# Patient Record
Sex: Female | Born: 1940
Health system: Southern US, Community
[De-identification: ages and names within clinical notes are randomized; demographics above are authoritative.]

## PROBLEM LIST (undated history)

## (undated) DIAGNOSIS — E042 Nontoxic multinodular goiter: Secondary | ICD-10-CM

## (undated) DIAGNOSIS — L259 Unspecified contact dermatitis, unspecified cause: Secondary | ICD-10-CM

## (undated) DIAGNOSIS — I10 Essential (primary) hypertension: Secondary | ICD-10-CM

## (undated) DIAGNOSIS — E785 Hyperlipidemia, unspecified: Secondary | ICD-10-CM

## (undated) DIAGNOSIS — M810 Age-related osteoporosis without current pathological fracture: Secondary | ICD-10-CM

## (undated) DIAGNOSIS — J309 Allergic rhinitis, unspecified: Secondary | ICD-10-CM

## (undated) HISTORY — DX: Age-related osteoporosis without current pathological fracture: M81.0

## (undated) HISTORY — DX: Hyperlipidemia, unspecified: E78.5

## (undated) HISTORY — DX: Allergic rhinitis, unspecified: J30.9

## (undated) HISTORY — DX: Hypercalcemia: E83.52

## (undated) HISTORY — DX: Nontoxic multinodular goiter: E04.2

## (undated) HISTORY — DX: Unspecified contact dermatitis, unspecified cause: L25.9

---

## 1999-02-04 ENCOUNTER — Other Ambulatory Visit: Admission: RE | Admit: 1999-02-04 | Discharge: 1999-02-04 | Payer: Self-pay | Admitting: Obstetrics and Gynecology

## 2000-03-09 ENCOUNTER — Other Ambulatory Visit: Admission: RE | Admit: 2000-03-09 | Discharge: 2000-03-09 | Payer: Self-pay | Admitting: Obstetrics and Gynecology

## 2001-03-15 ENCOUNTER — Other Ambulatory Visit: Admission: RE | Admit: 2001-03-15 | Discharge: 2001-03-15 | Payer: Self-pay | Admitting: Obstetrics and Gynecology

## 2002-03-21 ENCOUNTER — Other Ambulatory Visit: Admission: RE | Admit: 2002-03-21 | Discharge: 2002-03-21 | Payer: Self-pay | Admitting: Obstetrics and Gynecology

## 2003-03-24 ENCOUNTER — Other Ambulatory Visit: Admission: RE | Admit: 2003-03-24 | Discharge: 2003-03-24 | Payer: Self-pay | Admitting: Obstetrics and Gynecology

## 2004-03-16 ENCOUNTER — Other Ambulatory Visit: Admission: RE | Admit: 2004-03-16 | Discharge: 2004-03-16 | Payer: Self-pay | Admitting: *Deleted

## 2006-03-23 ENCOUNTER — Other Ambulatory Visit: Admission: RE | Admit: 2006-03-23 | Discharge: 2006-03-23 | Payer: Self-pay | Admitting: Obstetrics and Gynecology

## 2007-02-26 ENCOUNTER — Encounter: Payer: Self-pay | Admitting: Internal Medicine

## 2007-03-08 ENCOUNTER — Other Ambulatory Visit: Admission: RE | Admit: 2007-03-08 | Discharge: 2007-03-08 | Payer: Self-pay | Admitting: Obstetrics and Gynecology

## 2008-01-31 ENCOUNTER — Other Ambulatory Visit: Admission: RE | Admit: 2008-01-31 | Discharge: 2008-01-31 | Payer: Self-pay | Admitting: Obstetrics and Gynecology

## 2008-09-16 ENCOUNTER — Encounter: Payer: Self-pay | Admitting: Internal Medicine

## 2009-02-03 ENCOUNTER — Encounter: Payer: Self-pay | Admitting: Internal Medicine

## 2009-02-03 ENCOUNTER — Other Ambulatory Visit: Admission: RE | Admit: 2009-02-03 | Discharge: 2009-02-03 | Payer: Self-pay | Admitting: Obstetrics and Gynecology

## 2009-02-03 LAB — CONVERTED CEMR LAB
Alkaline Phosphatase: 59 units/L
BUN: 14 mg/dL
Chloride: 102 meq/L
Glucose, Bld: 98 mg/dL
Potassium: 4.9 meq/L

## 2009-02-27 ENCOUNTER — Encounter: Payer: Self-pay | Admitting: Internal Medicine

## 2009-06-03 ENCOUNTER — Encounter: Payer: Self-pay | Admitting: Internal Medicine

## 2009-06-03 LAB — CONVERTED CEMR LAB
BUN: 14 mg/dL
Chloride: 99 meq/L
Creatinine, Ser: 0.88 mg/dL
Sodium: 142 meq/L
TSH: 2.15 microintl units/mL

## 2009-07-06 ENCOUNTER — Encounter: Admission: RE | Admit: 2009-07-06 | Discharge: 2009-07-06 | Payer: Self-pay | Admitting: Internal Medicine

## 2009-07-28 ENCOUNTER — Ambulatory Visit: Payer: Self-pay | Admitting: Pulmonary Disease

## 2009-07-28 DIAGNOSIS — E785 Hyperlipidemia, unspecified: Secondary | ICD-10-CM | POA: Insufficient documentation

## 2009-07-31 ENCOUNTER — Encounter: Payer: Self-pay | Admitting: Internal Medicine

## 2009-07-31 LAB — CONVERTED CEMR LAB
BUN: 15 mg/dL
Creatinine, Ser: 1 mg/dL

## 2009-08-03 ENCOUNTER — Encounter: Admission: RE | Admit: 2009-08-03 | Discharge: 2009-08-03 | Payer: Self-pay | Admitting: Internal Medicine

## 2009-08-26 ENCOUNTER — Encounter: Payer: Self-pay | Admitting: Internal Medicine

## 2009-08-26 ENCOUNTER — Encounter: Admission: RE | Admit: 2009-08-26 | Discharge: 2009-08-26 | Payer: Self-pay | Admitting: Internal Medicine

## 2009-09-23 ENCOUNTER — Encounter: Payer: Self-pay | Admitting: Internal Medicine

## 2009-09-23 ENCOUNTER — Encounter: Admission: RE | Admit: 2009-09-23 | Discharge: 2009-09-23 | Payer: Self-pay | Admitting: Internal Medicine

## 2009-09-23 ENCOUNTER — Other Ambulatory Visit: Admission: RE | Admit: 2009-09-23 | Discharge: 2009-09-23 | Payer: Self-pay | Admitting: Interventional Radiology

## 2009-10-19 ENCOUNTER — Encounter: Payer: Self-pay | Admitting: Internal Medicine

## 2009-11-10 ENCOUNTER — Encounter (HOSPITAL_COMMUNITY): Admission: RE | Admit: 2009-11-10 | Discharge: 2009-12-29 | Payer: Self-pay | Admitting: Internal Medicine

## 2009-11-11 ENCOUNTER — Encounter: Payer: Self-pay | Admitting: Internal Medicine

## 2009-11-16 ENCOUNTER — Ambulatory Visit: Payer: Self-pay | Admitting: Internal Medicine

## 2009-11-16 DIAGNOSIS — M810 Age-related osteoporosis without current pathological fracture: Secondary | ICD-10-CM | POA: Insufficient documentation

## 2009-11-16 DIAGNOSIS — M79609 Pain in unspecified limb: Secondary | ICD-10-CM | POA: Insufficient documentation

## 2009-11-16 DIAGNOSIS — J309 Allergic rhinitis, unspecified: Secondary | ICD-10-CM | POA: Insufficient documentation

## 2009-11-24 ENCOUNTER — Encounter: Payer: Self-pay | Admitting: Internal Medicine

## 2009-11-25 ENCOUNTER — Encounter: Payer: Self-pay | Admitting: Internal Medicine

## 2009-12-01 ENCOUNTER — Ambulatory Visit: Payer: Self-pay | Admitting: Internal Medicine

## 2009-12-01 DIAGNOSIS — R5381 Other malaise: Secondary | ICD-10-CM | POA: Insufficient documentation

## 2009-12-01 DIAGNOSIS — R5383 Other fatigue: Secondary | ICD-10-CM

## 2009-12-01 DIAGNOSIS — L259 Unspecified contact dermatitis, unspecified cause: Secondary | ICD-10-CM | POA: Insufficient documentation

## 2009-12-01 LAB — CONVERTED CEMR LAB
Albumin: 4.4 g/dL (ref 3.5–5.2)
Alkaline Phosphatase: 63 units/L (ref 39–117)
Bilirubin, Direct: 0.1 mg/dL (ref 0.0–0.3)
Calcium: 9.4 mg/dL (ref 8.4–10.5)
Creatinine, Ser: 0.8 mg/dL (ref 0.4–1.2)
Eosinophils Relative: 1.6 % (ref 0.0–5.0)
GFR calc non Af Amer: 80.57 mL/min (ref >60)
Glucose, Bld: 86 mg/dL (ref 70–99)
HCT: 41.5 % (ref 36.0–46.0)
Hemoglobin: 14.4 g/dL (ref 12.0–15.0)
MCHC: 34.5 g/dL (ref 30.0–36.0)
MCV: 93 fL (ref 78.0–100.0)
Monocytes Relative: 9.3 % (ref 3.0–12.0)
Neutro Abs: 4.1 10*3/uL (ref 1.4–7.7)
Potassium: 4.9 meq/L (ref 3.5–5.1)
Triglycerides: 109 mg/dL (ref 0.0–149.0)
WBC: 6.1 10*3/uL (ref 4.5–10.5)

## 2009-12-02 ENCOUNTER — Telehealth: Payer: Self-pay | Admitting: Internal Medicine

## 2009-12-02 ENCOUNTER — Telehealth (INDEPENDENT_AMBULATORY_CARE_PROVIDER_SITE_OTHER): Payer: Self-pay | Admitting: *Deleted

## 2009-12-16 ENCOUNTER — Ambulatory Visit: Payer: Self-pay | Admitting: Endocrinology

## 2009-12-16 DIAGNOSIS — E042 Nontoxic multinodular goiter: Secondary | ICD-10-CM | POA: Insufficient documentation

## 2009-12-16 LAB — CONVERTED CEMR LAB
Calcium, Total (PTH): 10.6 mg/dL — ABNORMAL HIGH (ref 8.4–10.5)
PTH: 14.1 pg/mL (ref 14.0–72.0)

## 2009-12-23 ENCOUNTER — Telehealth: Payer: Self-pay | Admitting: Internal Medicine

## 2009-12-29 ENCOUNTER — Ambulatory Visit: Payer: Self-pay | Admitting: Internal Medicine

## 2009-12-29 DIAGNOSIS — M545 Low back pain, unspecified: Secondary | ICD-10-CM | POA: Insufficient documentation

## 2010-02-03 ENCOUNTER — Encounter: Admission: RE | Admit: 2010-02-03 | Discharge: 2010-02-03 | Payer: Self-pay | Admitting: Endocrinology

## 2010-02-19 ENCOUNTER — Ambulatory Visit: Payer: Self-pay | Admitting: Internal Medicine

## 2010-03-04 ENCOUNTER — Encounter: Payer: Self-pay | Admitting: Internal Medicine

## 2010-05-28 ENCOUNTER — Ambulatory Visit: Payer: Self-pay | Admitting: Internal Medicine

## 2010-06-02 ENCOUNTER — Ambulatory Visit: Admit: 2010-06-02 | Payer: Self-pay | Admitting: Internal Medicine

## 2010-06-02 LAB — CONVERTED CEMR LAB
Cholesterol: 221 mg/dL — ABNORMAL HIGH (ref 0–200)
TSH: 1.58 microintl units/mL (ref 0.35–5.50)
Triglycerides: 73 mg/dL (ref 0.0–149.0)

## 2010-06-09 ENCOUNTER — Encounter
Admission: RE | Admit: 2010-06-09 | Discharge: 2010-06-09 | Payer: Self-pay | Source: Home / Self Care | Attending: Internal Medicine | Admitting: Internal Medicine

## 2010-06-20 ENCOUNTER — Encounter: Payer: Self-pay | Admitting: Internal Medicine

## 2010-06-29 NOTE — Assessment & Plan Note (Signed)
Summary: ?shingles/cd   Vital Signs:  Patient profile:   70 year old female Height:      61.75 inches (156.85 cm) Weight:      122 pounds (55.45 kg) O2 Sat:      98 % on Room air Temp:     98.5 degrees F (36.94 degrees C) oral Pulse rate:   67 / minute BP sitting:   110 / 72  (left arm) Cuff size:   regular  Vitals Entered By: Orlan Leavens (December 01, 2009 8:58 AM)  O2 Flow:  Room air CC: ? Shingles, Rash Is Patient Diabetic? No Pain Assessment Patient in pain? no      Comments Pt states she has develop a red spot/rash on (R) side. Not painful but does itch   Primary Care Provider:  Newt Lukes MD  CC:  ? Shingles and Rash.  History of Present Illness:  Rash      This is a 70 year old woman who presents with Rash.  The symptoms began duration 6-8 weeks ago.  The severity is described as mild.  intermittent small area of itch in linear pattern on right flank -  worried about shingles?.  The patient reports itching and redness, but denies hives, welts, pustules, ulcers, oozing, and increased warmth.  The rash is located on the  lateral chest.  The rash is worse with heat, worse with scratching, better with calamine lotion, and better with H2 blockers.  The patient denies the following symptoms: fever, facial swelling, burning, difficulty breathing, abdominal pain, nausea, dizziness, and dysuria.  The patient denies history of recent tick bite, recent infection, recent antibiotic use, new medication, new clothing, new topical exposure, and pet/animal contact.    also c/o fatigue  reviewed prior OV also:  1) thyroid nodule - on left side - following with endo - s/p I -131 tx 10/2009 - denies mood or weight or bowel changes - confused re: results and mgmt recs from endo on "next step" tx plans, ?removal of gland - would like second opinion re: mgmt  2) dyslipidemia - reports last lipids showed total >200, never high before and never on tx for same - since last check, has  changed to low fat diet and tried to inc exercise (ballroom dancing) - fasting today  3) osteoporosis hx - by report - on actonel >36yrs for same - no hx fx known but c/o right foot pain since accidental injury 12/2008 - initially foot swollen and bruised after kick injury against wall while dancing, no pain and throbbing same location on foot nearly constant - stopped bisphos tx <12 mo ago indep after reading reports on risk of tx > 102yr  Clinical Review Panels:  Complete Metabolic Panel   Glucose:  86 (06/03/2009)   Sodium:  142 (06/03/2009)   Potassium:  4.3 (06/03/2009)   Chloride:  99 (06/03/2009)   CO2:  26 (06/03/2009)   BUN:  15 (07/31/2009)   Creatinine:  0.80 (10/19/2009)   Albumin:  4.4 (02/03/2009)   Total Protein:  6.7 (02/03/2009)   Calcium:  9.5 (10/19/2009)   Total Bili:  0.6 (02/03/2009)   Alk Phos:  59 (02/03/2009)   SGPT (ALT):  17 (02/03/2009)   SGOT (AST):  23 (02/03/2009)   Current Medications (verified): 1)  Multivitamins   Tabs (Multiple Vitamin) .... Take 1 Tablet By Mouth Once A Day 2)  Caltrate 600+d Plus 600-400 Mg-Unit Tabs (Calcium Carbonate-Vit D-Min) .Marland Kitchen.. 1 By Mouth Two Times  A Day  Allergies (verified): 1)  Codeine  Past History:  Past Medical History: Hyperlipidemia Allergic rhinitis Thyroid problems osteoporosis   MD roster: gyn - [facility] - kerr  Review of Systems  The patient denies hoarseness, chest pain, syncope, and headaches.    Physical Exam  General:  alert, well-developed, well-nourished, and cooperative to examination.    Lungs:  normal respiratory effort, no intercostal retractions or use of accessory muscles; normal breath sounds bilaterally - no crackles and no wheezes.    Heart:  normal rate, regular rhythm, no murmur, and no rub. BLE without edema.  Skin:  eczema linear patch on right flank Psych:  Oriented X3, memory intact for recent and remote, normally interactive, good eye contact, not anxious appearing, not  depressed appearing, and not agitated.      Impression & Recommendations:  Problem # 1:  ECZEMA (ICD-692.9)  reassured - rash not shingles - but do rec zostavax - ok to use benadryl spray as needed (declines steroid cream rx)  Discussed avoidance of triggers and symptomatic treatment.   Problem # 2:  THYROID NODULE, LEFT (ICD-241.0)  by hx, s/p I-131 10/2009 - ?rec for thyroidectomy per pt report of endo recs will refer for second endo opinion (pt requests LeB)- to cont mgmt as ongoing  Orders: TLB-TSH (Thyroid Stimulating Hormone) (84443-TSH) Endocrinology Referral (Endocrine)  Problem # 3:  HYPERLIPIDEMIA (ICD-272.4)  hx same, never on med tx - check labs now advised cont exercise and heart healthy diet/low fat  Orders: TLB-Lipid Panel (80061-LIPID)  Labs Reviewed: SGOT: 23 (02/03/2009)   SGPT: 17 (02/03/2009)  Problem # 4:  FATIGUE (ICD-780.79)  Orders: TLB-CBC Platelet - w/Differential (85025-CBCD) TLB-TSH (Thyroid Stimulating Hormone) (84443-TSH) TLB-Hepatic/Liver Function Pnl (80076-HEPATIC) TLB-BMP (Basic Metabolic Panel-BMET) (80048-METABOL)  Complete Medication List: 1)  Multivitamins Tabs (Multiple vitamin) .... Take 1 tablet by mouth once a day 2)  Caltrate 600+d Plus 600-400 Mg-unit Tabs (Calcium carbonate-vit d-min) .Marland Kitchen.. 1 by mouth two times a day  Patient Instructions: 1)  it was good to see you today. 2)  test(s) ordered today - your results will be posted on the phone tree for review in 48-72 hours from the time of test completion; call [phone] and enter your 9 digit MRN (listed above on this page, just below your name); if any changes need to be made or there are abnormal results, you will be contacted directly.  3)  consider Shingles vaccine once details worked out -- 4)  we'll make referral to [facility] endocrinology for second opinion about your thyroid nodule. Our office will contact you regarding this appointment once made.  5)  Please  schedule a follow-up appointment in 6 months, sooner if problems.

## 2010-06-29 NOTE — Progress Notes (Signed)
Summary: 24 hour urine  Phone Note From Other Clinic   Caller: shana ext [phone] Summary of Call: Pt down stairs stating Dr. Everardo All left msg on phone tree for her to come in for 24 hour urine. No order in comp. Address with Dr. Jonny Ruiz he states ok to put order in IDX. dx: 275.42 Initial call taken by: Orlan Leavens RMA,  December 23, 2009 3:30 PM  Follow-up for Phone Call        Notified shana ok for order. Put in IDX Follow-up by: Orlan Leavens RMA,  December 23, 2009 3:33 PM

## 2010-06-29 NOTE — Letter (Signed)
Summary: [facility]  [facility]   Imported By: Lester  11/27/2009 09:18:11  _____________________________________________________________________  External Attachment:    Type:   Image     Comment:   External Document

## 2010-06-29 NOTE — Assessment & Plan Note (Signed)
Summary: MUSCLE SPASMS/NWS   Vital Signs:  Patient profile:   70 year old female Height:      61.75 inches (156.85 cm) Weight:      120.50 pounds (54.77 kg) O2 Sat:      96 % on Room air Temp:     98.1 degrees F (36.72 degrees C) oral Pulse rate:   68 / minute BP sitting:   118 / 72  (left arm) Cuff size:   regular  Vitals Entered By: Orlan Leavens RMA (December 29, 2009 1:42 PM)  O2 Flow:  Room air CC: muscle spasm (R) back, Back pain Is Patient Diabetic? No Pain Assessment Patient in pain? yes     Location: lower back Type: aching   Primary Care Provider:  Newt Lukes MD  CC:  muscle spasm (R) back and Back pain.  History of Present Illness:  Back Pain      This is a 70 year old woman who presents with Back pain.  The symptoms began 5 days ago.  The intensity is described as moderate-severe.  picking up mother's transport chair and twisting -.  The patient reports rest pain, but denies fever, chills, weakness, loss of sensation, fecal incontinence, dysuria, inability to work, and inability to care for self.  The pain is located in the right low back and right SI joint.  The pain began after lifting.  The pain radiates to the right buttock.  The pain is made worse by standing or walking and activity.  The pain is made better by inactivity and NSAID medications.  Risk factors for serious underlying conditions include age >= 50 years and history of osteoporosis.     prior hx also reviewed:  pt was incidentally noted to have a nodular thyroid, on ct of chest.  she was unaware of having any thyroid problem. symptomatically, she reports few years of slight dryness of the hair, but no associated swelling of the neck.    pt was noted to have osteoporosis in 2008, and again in 2010.  she took actonel x 7 years.  she denies:  prolonged bedrest, urolithiasis, seizures, dvt/pe, renal dz, and liver dz.  only bony fracture was a car door slamming on 2 fingers as a child.   Current  Medications (verified): 1)  Multivitamins   Tabs (Multiple Vitamin) .... Take 1 Tablet By Mouth Once A Day 2)  Caltrate 600+d Plus 600-400 Mg-Unit Tabs (Calcium Carbonate-Vit D-Min) .Marland Kitchen.. 1 By Mouth Two Times A Day  Allergies (verified): 1)  Codeine  Past History:  Past Medical History: Hyperlipidemia Allergic rhinitis Thyroid problems osteoporosis    MD roster: gyn - [facility] - kerr  Review of Systems  The patient denies fever, incontinence, muscle weakness, and difficulty walking.    Physical Exam  General:  alert, well-developed, well-nourished, and cooperative to examination.    Lungs:  normal respiratory effort, no intercostal retractions or use of accessory muscles; normal breath sounds bilaterally - no crackles and no wheezes.    Heart:  normal rate, regular rhythm, no murmur, and no rub. BLE without edema.  Msk:  back: full range of motion of lumbar spine. Nontender to palpatio over spine but +paraspinal spasm on right side. Negative straight leg raise. Deep tendon reflexes symmetrically intact at Achilles and patella, negative clonus. Sensation intact throughout all dermatomes in bilateral lower extremities. Full strength to manual muscle testing in all major muscule groups including EHL, anterior tibialis, gastrocnemius, quadriceps, and iliopsoas. Able to  heel and toe walk without difficulty and ambulates with a normal gait.    Impression & Recommendations:  Problem # 1:  BACK PAIN, LUMBAR (ICD-724.2)  muscle spasm after lifting - no radiculopathy  Orders: Prescription Created Electronically [phone])  Her updated medication list for this problem includes:    Robaxin 500 Mg Tabs (Methocarbamol) .Marland Kitchen... 1 by mouth every 8 hours as needed for back spasm  Complete Medication List: 1)  Multivitamins Tabs (Multiple vitamin) .... Take 1 tablet by mouth once a day 2)  Caltrate 600+d Plus 600-400 Mg-unit Tabs (Calcium carbonate-vit d-min) .Marland Kitchen.. 1 by mouth two times a  day 3)  Robaxin 500 Mg Tabs (Methocarbamol) .Marland Kitchen.. 1 by mouth every 8 hours as needed for back spasm  Patient Instructions: 1)  it was good to see you today. 2)  robaxin for muscle spasm - use with advil and gel packs as you are doing 3)  Most patients (90%) with low back pain will improve with time (2-6 weeks). Keep active but avoid activities that are painful. Apply moist heat and/or ice to lower back several times a day. 4)  Please schedule a follow-up appointment as needed. Prescriptions: ROBAXIN 500 MG TABS (METHOCARBAMOL) 1 by mouth every 8 hours as needed for back spasm  #30 x 0   Entered and Authorized by:   Newt Lukes MD   Signed by:   Newt Lukes MD on 12/29/2009   Method used:   Electronically to        [facility] [phone]* (retail)       [address]       [place], [place]  [address]       Ph: [phone] or [phone]       Fax: [phone]   RxID:   [id number]

## 2010-06-29 NOTE — Assessment & Plan Note (Signed)
Summary: PER FLAG/MP-SCHED NEW ENDO PT-MEDICARE-LEFT THY NODULE-PER DR...   Vital Signs:  Patient profile:   70 year old female Height:      61.75 inches (156.85 cm) Weight:      120.50 pounds (54.77 kg) BMI:     22.30 O2 Sat:      99 % on Room air Temp:     98.4 degrees F (36.89 degrees C) oral Pulse rate:   106 / minute BP sitting:   124 / 82  (left arm) Cuff size:   regular  Vitals Entered By: Brenton Grills MA (December 16, 2009 10:13 AM)  O2 Flow:  Room air CC: New Endo pt/Left Thyroid Nodule/Dr. Leschber/aj   Referring Provider:  Dr. Sharl Ma Primary Provider:  Newt Lukes MD  CC:  New Endo pt/Left Thyroid Nodule/Dr. Leschber/aj.  History of Present Illness: pt was incidentally noted to have a nodular thyroid, on ct of chest.  she was unaware of having any thyroid problem. symptomatically, she reports few years of slight dryness of the hair, but no associated swelling of the neck.   pt was noted to have osteoporosis in 2008, and again in 2010.  she took actonel x 7 years.  she denies:  prolonged bedrest, urolithiasis, seizures, dvt/pe, renal dz, and liver dz.  only bony fracture was a car door slamming on 2 fingers as a child.   Current Medications (verified): 1)  Multivitamins   Tabs (Multiple Vitamin) .... Take 1 Tablet By Mouth Once A Day 2)  Caltrate 600+d Plus 600-400 Mg-Unit Tabs (Calcium Carbonate-Vit D-Min) .Marland Kitchen.. 1 By Mouth Two Times A Day  Allergies (verified): 1)  Codeine  Past History:  Past Medical History: Last updated: 12/01/2009 Hyperlipidemia Allergic rhinitis Thyroid problems osteoporosis   MD roster: gyn - [facility] - kerr  Family History: Family History C V A / Stroke-mother (Addie [place]) Family History Lung Cancer-Father is deceased, Prostate cancer Family History of Arthritis (parent) Family History Hypertension (parents) 1 brother alive hypertension, hyperlipidemia. Brain injury to seld inflicted gunshot wound, mentally  challeged 1 sister alive hyperlipidemia, osteoporosis. no thyroid problems.  Social History: Reviewed history from 11/16/2009 and no changes required. Marital Status: married, lives with spouse  Children: yes Occupation: retired Patient never smoked.  no alcohol  Review of Systems       denies weight loss, headache, hoarseness, double vision, palpitations, sob, diarrhea, polyuria, myalgias, excessive diaphoresis, numbness, tremor, anxiety, menopausal sxs, and rhinorrhea.  she reports easy bruising.   Physical Exam  General:  normal appearance.   Head:  head: no deformity eyes: no periorbital swelling, no proptosis external nose and ears are normal mouth: no lesion seen Neck:  i do not appreciate the goiter Lungs:  Clear to auscultation bilaterally. Normal respiratory effort.  Heart:  Regular rate and rhythm without murmurs or gallops noted. Normal S1,S2.   Msk:  muscle bulk and strength are grossly normal.  no obvious joint swelling.  gait is normal and steady no kyphosis Extremities:  no edema no deformity Neurologic:  cn 2-12 grossly intact.   readily moves all 4's.   sensation is intact to touch on all 4's Skin:  normal texture and temp.  no rash.  not diaphoretic  Cervical Nodes:  No significant adenopathy.  Psych:  Alert and cooperative; normal mood and affect; normal attention span and concentration.   Additional Exam:  tsh=normal  outside test results are reviewed:  THYROID ULTRASOUND There are bilateral thyroid nodules, with the dominant solid nodule  in the lower pole of the left lobe of 2.4 x 1.7 x 1.6 cm.  pathology: THYROID, FINE NEEDLE ASPIRATION, LEFT   There are follicular epithelial cells in microfollicular aggregates with   associated colloid. The differential diagnosis would include a follicular   lesion such as an adenomatous nodule or an adenoma. Although i do not see the   nuclear features of papillary carcinoma, a follicular variant of  papillary   carcinoma is also in the differential.  FNA, THYROID, RIGHT Findings consistent with non-neoplastic goiter.  THYROID SCAN AND UPTAKE - 24 HOURS Normal 24 radioiodine uptake of 17%. Normal thyroid scan.  today: Parathyroid Hormone       14.1 pg/mL                  14.0-72.0   Calcium              [H]  10.6 mg/dL          Impression & Recommendations:  Problem # 1:  GOITER, MULTINODULAR (ICD-241.1) usually hereditary euthyroid  TSH: 1.53 (12/01/2009)     Problem # 2:  OSTEOPOROSIS (ICD-733.00) moderate  she has completed bisphosphonate course  Problem # 3:  HYPERCALCEMIA (ICD-275.42) uncertain etiology  Other Orders: T-Parathyroid Hormone, Intact w/ Calcium ([id number]) Radiology Referral (Radiology) Consultation Level IV (46962)  Patient Instructions: 1)  most of the time, a "lumpy thyroid" will eventually become overactive.  this is usually a slow process, happening over the span of many years. 2)  recheck ultrasound sept 2011.  you will be called with a day and time for an appointment 3)  if no significant change, please return here summer 2012. 4)  blood tests are being ordered for you today.  please call [phone] to hear your test results. 5)  pending the test results, please consider options for osteoporosis:  "forteo" (a once daily injection for 2 years, or "prolia," 1 shot at the dr's office every 6 months).  6)  (update: i left message on phone-tree:  options for hypercalcemia are 24-hr urine ca++, or follow it.)

## 2010-06-29 NOTE — Letter (Signed)
Summary: Talmage Coin MD  Talmage Coin MD   Imported By: Lester [place] 02/19/2010 08:54:35  _____________________________________________________________________  External Attachment:    Type:   Image     Comment:   External Document

## 2010-06-29 NOTE — Assessment & Plan Note (Signed)
Summary: new,uhc/#/cd   Vital Signs:  Patient profile:   70 year old female Height:      61.75 inches (156.85 cm) Weight:      122.0 pounds (55.45 kg) O2 Sat:      96 % on Room air Temp:     99.2 degrees F (37.33 degrees C) oral Pulse rate:   71 / minute BP sitting:   124 / 70  (left arm) Cuff size:   regular  Vitals Entered By: Orlan Leavens (November 16, 2009 1:53 PM)  O2 Flow:  Room air CC: New patient Is Patient Diabetic? No Pain Assessment Patient in pain? no        Primary Care Provider:  Newt Lukes MD  CC:  New patient.  History of Present Illness: new pt to me and our division, here to est care - prev followed at [facility] for PCP, gyn and endo care  1) thyroid nodule - on left side - following with endo - s/p I -131 tx last week - denies mood or weight or bowel changes - no results yet from endo on "next step" tx plans  2) dyslipidemia - reports last lipids showed totla >200, never high before and never on tx for same - since last check, has changed to low fat diet and tried to inc exercise (ballroom dancing) - not fasting today  3) osteoporosis hx - by report - on actonel >64yrs for same - no hx fx known but c/o right foot pain since accidental injury 12/2008 - initially foot swollen and bruised after kick injury against wall while dancing, no pain and throbbing same location on foot nearly constant - stopped bisphos tx <12 mo ago indep after reading reports on risk of tx > 60yr  Clinical Review Panels:  Immunizations   Last Tetanus Booster:  Historical (09/08/2005)   Last Flu Vaccine:  Historical (02/02/2009)  Diabetes Management   Last Flu Vaccine:  Historical (02/02/2009)   Current Medications (verified): 1)  Multivitamins   Tabs (Multiple Vitamin) .... Take 1 Tablet By Mouth Once A Day  Allergies (verified): 1)  Codeine  Past History:  Past Medical History: Hyperlipidemia Allergic rhinitis Thyroid problems osteoporosis  MD roster: gyn -  [facility] - kerr  Past Surgical History: none   Family History: Family History C V A / Stroke-mother (Addie [place]) Family History Lung Cancer-Father is deceased, Prostate cancer Family History of Arthritis (parent) Family History Hypertension (parents)  Social History: Marital Status: married, lives with spouse  Children: yes Occupation: retired Patient never smoked.  no alcohol  Review of Systems       see HPI above. I have reviewed all other systems and they were negative.   Physical Exam  General:  alert, well-developed, well-nourished, and cooperative to examination.    Eyes:  vision grossly intact; pupils equal, round and reactive to light.  conjunctiva and lids normal.    Ears:  normal pinnae bilaterally, without erythema, swelling, or tenderness to palpation. TMs clear, without effusion, or cerumen impaction. Hearing grossly normal bilaterally  Mouth:  teeth and gums in good repair; mucous membranes moist, without lesions or ulcers. oropharynx clear without exudate, no erythema.  Neck:  supple, full ROM, no masses, no thyromegaly; no thyroid nodules or tenderness. no JVD or carotid bruits.   Lungs:  normal respiratory effort, no intercostal retractions or use of accessory muscles; normal breath sounds bilaterally - no crackles and no wheezes.    Heart:  normal rate, regular  rhythm, no murmur, and no rub. BLE without edema. Abdomen:  soft, non-tender, normal bowel sounds, no distention; no masses and no appreciable hepatomegaly or splenomegaly.   Genitalia:  defer to gyn Msk:  No deformity or scoliosis noted of thoracic or lumbar spine.  slight chronic enlargement of 1st MTP head on right foot as compared to left - FROM w/o pain Neurologic:  alert & oriented X3 and cranial nerves II-XII symetrically intact.  strength normal in all extremities, sensation intact to light touch, and gait normal. speech fluent without dysarthria or aphasia; follows commands with good  comprehension.  Skin:  no rashes, vesicles, ulcers, or erythema. No nodules or irregularity to palpation.  Psych:  Oriented X3, memory intact for recent and remote, normally interactive, good eye contact, not anxious appearing, not depressed appearing, and not agitated.      Impression & Recommendations:  Problem # 1:  FOOT PAIN, RIGHT (ICD-729.5) hx self -inflcted accidental injury 12/2008 (kicked wall while dancing) but never evaluated since injury chronic bony enlargment with daily pain -  ?remote fx vs DJD - check xray now Orders: T-Foot Right ([id number])  Problem # 2:  OSTEOPOROSIS (ICD-733.00) need to obtain records on DEXA hx and prior med tx -  reports on actonel > 89yr but self-stopped due to risks - ?brittle bone - to cont Ca+D two times a day until then  Problem # 3:  THYROID NODULE, LEFT (ICD-241.0) send for records - by hx, s/p I-131 last week - will review from endo - to cont mgmt as ongoing  Problem # 4:  HYPERLIPIDEMIA (ICD-272.4) hx same, never on med tx - will review labs once available - send for records now - advised cont exercise and heart healthy diet/low fat check next OV once fasting Time spent with patient 45 minutes, more than 50% of this time was spent reviewing symptoms and prior med treatments, also counseling patient on need for recheck labs once prior records reviewed to ensure optimal med tx  Complete Medication List: 1)  Multivitamins Tabs (Multiple vitamin) .... Take 1 tablet by mouth once a day 2)  Caltrate 600+d Plus 600-400 Mg-unit Tabs (Calcium carbonate-vit d-min) .Marland Kitchen.. 1 by mouth two times a day  Patient Instructions: 1)  it was good to see you today. 2)  will send to [facility] for copy of labs and records in the past few years to review and incorporate into files here - we will call you once this has been reviewed if we need to do anything different - 3)  xray right foot ordered today - your results will be called to you in 48-72 hours from the  time of test completion 4)  Please schedule a follow-up appointment in 6 months, sooner if problems.    Immunization History:  Tetanus/Td Immunization History:    Tetanus/Td:  historical (09/08/2005)

## 2010-06-29 NOTE — Progress Notes (Signed)
Summary: shingle  ---- Converted from flag ---- ---- 12/01/2009 10:08 AM, Orlan Leavens wrote: Call pt about cost of  shingle ------------------------------       Additional Follow-up for Phone Call Additional follow up Details #2::    tried to call pt to let her know the cost of shingle will be $63 per Truddie Hidden. Left msg on VM to RTC  Follow-up by: Orlan Leavens,  December 02, 2009 11:38 AM  Additional Follow-up for Phone Call Additional follow up Details #3:: Details for Additional Follow-up Action Taken: Called pt again gave her pricing on shingle injection. Pt states will call back to schedule appt after her mom have her appts. Additional Follow-up by: Orlan Leavens,  December 07, 2009 2:34 PM  Called pt again today still no ansew LMOM RTC.....12/03/09@2 :56pm/LMB

## 2010-06-29 NOTE — Progress Notes (Signed)
----   Converted from flag ---- ---- 12/02/2009 3:58 PM, Ivar Bury wrote: Gave pt appt/phone:  12/16/09 @ 10A w/ Dr Everardo All  ---- 12/01/2009 11:03 AM, Dagoberto Reef wrote: please schedule with Dr Everardo All.  Thanks  ---- 12/01/2009 9:37 AM, Newt Lukes MD wrote: The following orders have been entered for this patient and placed on Admin Hold:  Type:     Referral       Code:   Endocrine Description:   Endocrinology Referral Order Date:   12/01/2009   Authorized By:   Newt Lukes MD Order #:   [id number] Clinical Notes:   [facility],  pt requests 2nd opinion thyroid nodule eval and tx- ------------------------------

## 2010-06-29 NOTE — Assessment & Plan Note (Signed)
Summary: FLU SHOT Jasmine Esparza  #  Nurse Visit   Allergies: 1)  Codeine  Orders Added: 1)  Flu Vaccine 66yrs + MEDICARE PATIENTS [Q2039] 2)  Administration Flu vaccine - MCR [G0008]      Flu Vaccine Consent Questions     Do you have a history of severe allergic reactions to this vaccine? no    Any prior history of allergic reactions to egg and/or gelatin? no    Do you have a sensitivity to the preservative Thimersol? no    Do you have a past history of Guillan-Barre Syndrome? no    Do you currently have an acute febrile illness? no    Have you ever had a severe reaction to latex? no    Vaccine information given and explained to patient? yes    Are you currently pregnant? no    Lot Number:[id number]   Exp Date:11/27/2010   Site Given  Left Deltoid IMu

## 2010-06-29 NOTE — Assessment & Plan Note (Signed)
Summary: emphysema/self ref/apc   Visit Type:  Initial Consult Copy to:  Dr. Sharl Ma Primary Provider/Referring Provider:  Dr. Delfino Lovett (PCP), Dr. Otilio Carpen (GYN)  CC:  Pt here for pulmonary consult. Marland Kitchen  History of Present Illness: 69/F never smoker referred for evlaution of 'emphysema ' noted on CXR. She denies dyspnea, wheezing or cough & is in overall excellent health. She had bone density testing by Dr Sharl Ma & CXR which noted hyperinflation & commented about 'emphysema'. Spirometry shows normal lung function.  Preventive Screening-Counseling & Management  Alcohol-Tobacco     Smoking Status: never  Current Medications (verified): 1)  Multivitamins   Tabs (Multiple Vitamin) .... Take 1 Tablet By Mouth Once A Day  Allergies (verified): 1)  Codeine  Past History:  Past Medical History: Hyperlipidemia  Past Surgical History: none  Family History: Family History C V A / Stroke-mother (Addie Holual[place]) Family History Lung Cancer-Father is deceased  Social History: Marital Status: married, lives with spouse Children: yes Occupation: retired Patient never smoked.  Smoking Status:  never  Review of Systems  The patient denies shortness of breath with activity, shortness of breath at rest, productive cough, non-productive cough, coughing up blood, chest pain, irregular heartbeats, acid heartburn, indigestion, loss of appetite, weight change, abdominal pain, difficulty swallowing, sore throat, tooth/dental problems, headaches, nasal congestion/difficulty breathing through nose, sneezing, itching, ear ache, anxiety, depression, hand/feet swelling, joint stiffness or pain, rash, change in color of mucus, and fever.    Vital Signs:  Patient profile:   70 year old female Height:      61.75 inches Weight:      124.38 pounds BMI:     23.02 O2 Sat:      97 % on Room air Temp:     98.0 degrees F oral Pulse rate:   74 / minute BP sitting:   130 / 82  (right arm) Cuff size:    regular  Vitals Entered By: Zackery Barefoot CMA (July 28, 2009 3:33 PM)  O2 Flow:  Room air CC: Pt here for pulmonary consult.  Comments Medications reviewed with patient Verified contact number and pharmacy with patient Zackery Barefoot CMA  July 28, 2009 3:34 PM    Physical Exam  Additional Exam:  Gen. Pleasant, well-nourished, in no distress, normal affect ENT - no lesions, no post nasal drip Neck: No JVD, no thyromegaly, no carotid bruits Lungs: no use of accessory muscles, no dullness to percussion, clear without rales or rhonchi  Cardiovascular: Rhythm regular, heart sounds  normal, no murmurs or gallops, no peripheral edema Abdomen: soft and non-tender, no hepatosplenomegaly, BS normal. Musculoskeletal: No deformities, no cyanosis or clubbing Neuro:  alert, non focal     Pulmonary Function Test Date: 07/28/2009 3:57 PM Gender: Female  Pre-Spirometry FVC    Value: 2.15 L/min   % Pred: 79.80 % FEV1    Value: 1.83 L     Pred: 2.05 L     % Pred: 89.60 % FEV1/FVC  Value: 85.22 %     % Pred: 111.30 %  Impression & Recommendations:  Problem # 1:  ABNORMAL RADIOLOGIAL EXAMINATION (ICD-793.9)  No evidence of COPD or emphysema - CXR appearance realtes to hyperinflation & no intervention necessary. She was reassured.  Orders: Consultation Level II (88416) Spirometry w/Graph (94010)  Medications Added to Medication List This Visit: 1)  Multivitamins Tabs (Multiple vitamin) .... Take 1 tablet by mouth once a day  Patient Instructions: 1)  You do not have emphysema  2)  You chest xray shows hyperinflation but your lung function test is within normal limits 3)  Copy sent to: Dr Sharl Ma   Immunization History:  Influenza Immunization History:    Influenza:  historical (02/02/2009)    CardioPerfect Spirometry  ID: [id number] Patient: LINNAE, RASOOL DOB: [birth year] Age: 70 Years Old Sex: Female Race: White Height: 61.75 Weight: 124.38 Status:  Confirmed Recorded: 07/28/2009 3:57 PM  Parameter  Measured Predicted %Predicted FVC     2.15        2.70        79.80 FEV1     1.83        2.05        89.60 FEV1%   85.22        76.57        111.30 PEF    4.18        5.36        78   Interpretation: Good effort Within normal limits No airway obstruction

## 2010-06-29 NOTE — Letter (Signed)
Summary: Deboraha Sprang at [facility] at [facility]   Imported By: Lester Laurelton 11/27/2009 09:19:42  _____________________________________________________________________  External Attachment:    Type:   Image     Comment:   External Document

## 2010-07-01 NOTE — Assessment & Plan Note (Signed)
Summary: 6 MTH FU STC   Vital Signs:  Patient profile:   70 year old female Height:      61.5 inches Weight:      118.38 pounds BMI:     22.09 O2 Sat:      98 % on Room air Temp:     99.1 degrees F oral Pulse rate:   67 / minute BP sitting:   102 / 80  (left arm) Cuff size:   regular  Vitals Entered By: Zella Ball Ewing CMA Duncan Dull) (May 28, 2010 10:17 AM)  O2 Flow:  Room air CC: 6 month followup/RE   Primary Care Provider:  Newt Lukes MD  CC:  6 month followup/RE.  History of Present Illness: here for f/u  1) thyroid nodule - on left side - following with endo - s/p I -131 tx 10/2009 - denies mood or weight or bowel changes - confused re: results and mgmt recs from endo on "next step" tx plans, ?removal of gland - s/p second opinion re: mgmt summer 2011 ([facility])  2) dyslipidemia - reports last lipids showed total >200, never high before and never on tx for same - since last check, has changed to low fat diet and tried to inc exercise (ballroom dancing) - fasting today  3) osteoporosis hx - by report -  prev on actonel >35yrs for same - no hx fx known but c/o right foot pain since accidental injury 12/2008 - initially foot swollen and bruised after kick injury against wall while dancing, no pain and throbbing same location on foot nearly constant - stopped bisphos tx <12 mo ago indep after reading reports on risk of tx > 92yr -she denies:  prolonged bedrest, urolithiasis, seizures, dvt/pe, renal dz, and liver dz.   Clinical Review Panels:  Prevention   Last Mammogram:  Done @ [facility] No specific mammographic evidence of malignancy.  Assessment: BIRADS 1. (03/04/2010)   Last Pap Smear:  Interpretation Result:Negative for intraepithelial Lesion or Malignancy.    (02/03/2009)   Last Colonoscopy:  Results: Diverticulosis, left sided without polyps      (03/11/2008)  Immunizations   Last Tetanus Booster:  Historical (09/08/2005)   Last Flu Vaccine:  Fluvax 3+  (02/19/2010)  Lipid Management   Cholesterol:  223 (12/01/2009)   HDL (good cholesterol):  75.10 (12/01/2009)  CBC   WBC:  6.1 (12/01/2009)   RBC:  4.46 (12/01/2009)   Hgb:  14.4 (12/01/2009)   Hct:  41.5 (12/01/2009)   Platelets:  255.0 (12/01/2009)   MCV  93.0 (12/01/2009)   MCHC  34.5 (12/01/2009)   RDW  13.1 (12/01/2009)   PMN:  66.9 (12/01/2009)   Lymphs:  21.9 (12/01/2009)   Monos:  9.3 (12/01/2009)   Eosinophils:  1.6 (12/01/2009)   Basophil:  0.3 (12/01/2009)  Complete Metabolic Panel   Glucose:  86 (12/01/2009)   Sodium:  141 (12/01/2009)   Potassium:  4.9 (12/01/2009)   Chloride:  108 (12/01/2009)   CO2:  29 (12/01/2009)   BUN:  17 (12/01/2009)   Creatinine:  0.8 (12/01/2009)   Albumin:  4.4 (12/01/2009)   Total Protein:  6.8 (12/01/2009)   Calcium:  9.4 (12/01/2009)   Total Bili:  0.4 (12/01/2009)   Alk Phos:  63 (12/01/2009)   SGPT (ALT):  16 (12/01/2009)   SGOT (AST):  24 (12/01/2009)   Current Medications (verified): 1)  Multivitamins   Tabs (Multiple Vitamin) .... Take 1 Tablet By Mouth Once A Day 2)  Caltrate  600+d Plus 600-400 Mg-Unit Tabs (Calcium Carbonate-Vit D-Min) .Marland Kitchen.. 1 By Mouth Two Times A Day 3)  Robaxin 500 Mg Tabs (Methocarbamol) .Marland Kitchen.. 1 By Mouth Every 8 Hours As Needed For Back Spasm  Allergies (verified): 1)  Codeine  Past History:  Past Medical History: Hyperlipidemia Allergic rhinitis Thyroid problems osteoporosis hypercalcemia, mild    MD roster: gyn - [facility] endo - kerr  Review of Systems  The patient denies anorexia, fever, weight loss, chest pain, and abdominal pain.    Physical Exam  General:  alert, well-developed, well-nourished, and cooperative to examination.    Neck:  supple, full ROM, no masses, no thyromegaly; no thyroid nodules or tenderness. no JVD or carotid bruits.   Lungs:  normal respiratory effort, no intercostal retractions or use of accessory muscles; normal breath sounds bilaterally - no crackles  and no wheezes.    Heart:  normal rate, regular rhythm, no murmur, and no rub. BLE without edema.  Psych:  Oriented X3, memory intact for recent and remote, normally interactive, good eye contact, not anxious appearing, not depressed appearing, and not agitated.      Impression & Recommendations:  Problem # 1:  GOITER, MULTINODULAR (ICD-241.1)  due for recheck thyroid US - f/u endo as needed  labs now (euthyroid prev)  Orders: TLB-TSH (Thyroid Stimulating Hormone) (04540-JWJ) Radiology Referral (Radiology)  Problem # 2:  OSTEOPOROSIS (ICD-733.00)  sched DEXA - noted mild hypercalcemia with endo eval summer 2011 ([facility]) reports on actonel > 48yr but self-stopped due to risks - ?brittle bone - to cont Ca+D two times a day and consider prolia or forteo  Orders: Misc. Referral (Misc. Ref)  Problem # 3:  HYPERLIPIDEMIA (ICD-272.4)  hx same, never on med tx - check labs now advised cont exercise and heart healthy diet/low fat  Labs Reviewed: SGOT: 24 (12/01/2009)   SGPT: 16 (12/01/2009)   HDL:75.10 (12/01/2009)  Chol:223 (12/01/2009)  Trig:109.0 (12/01/2009)  Orders: TLB-Lipid Panel (80061-[id number])  Complete Medication List: 1)  Multivitamins Tabs (Multiple vitamin) .... Take 1 tablet by mouth once a day 2)  Caltrate 600+d Plus 600-400 Mg-unit Tabs (Calcium carbonate-vit d-min) .Marland Kitchen.. 1 by mouth two times a day 3)  Robaxin 500 Mg Tabs (Methocarbamol) .Marland Kitchen.. 1 by mouth every 8 hours as needed for back spasm  Other Orders: Gynecologic Referral (Gyn)  Patient Instructions: 1)  it was good to see you today. 2)  test(s) ordered today - return when fasting as we discussed and go straight to the lab - your results will be posted on the phone tree for review in 48-72 hours from the time of test completion; call [phone] and enter your 9 digit MRN (listed above on this page, just below your name); if any changes need to be made or there are abnormal results, you will be contacted  directly.  3)  we'll make referral for thyroid ultrasound, dexa bone scan and gynecology . Our office will contact you regarding these appointments once made.  4)  Please schedule a follow-up appointment annually for thyroid and cholesterol check, call sooner if problems.    Orders Added: 1)  TLB-TSH (Thyroid Stimulating Hormone) [[id number]] 2)  Radiology Referral [Radiology] 3)  TLB-Lipid Panel [[id number]] 4)  Gynecologic Referral [Gyn] 5)  Misc. Referral [Misc. Ref] 6)  Est. Patient Level IV [21308]

## 2010-11-16 ENCOUNTER — Encounter: Payer: Self-pay | Admitting: Internal Medicine

## 2011-02-22 ENCOUNTER — Ambulatory Visit (INDEPENDENT_AMBULATORY_CARE_PROVIDER_SITE_OTHER): Payer: Medicare Other

## 2011-02-22 DIAGNOSIS — Z23 Encounter for immunization: Secondary | ICD-10-CM

## 2011-03-09 LAB — HM MAMMOGRAPHY: HM Mammogram: NEGATIVE

## 2011-03-10 ENCOUNTER — Encounter: Payer: Self-pay | Admitting: Internal Medicine

## 2011-03-22 ENCOUNTER — Encounter: Payer: Self-pay | Admitting: Internal Medicine

## 2011-04-04 ENCOUNTER — Other Ambulatory Visit: Payer: Self-pay | Admitting: *Deleted

## 2011-04-04 DIAGNOSIS — M810 Age-related osteoporosis without current pathological fracture: Secondary | ICD-10-CM

## 2011-04-04 NOTE — Telephone Encounter (Signed)
Pt left msg on vm requesting bone density results done 2 weeks ago....04/04/11@3 :40pm/LMB

## 2011-04-04 NOTE — Telephone Encounter (Signed)
DEXA showed osteoporosis: (-3.1) in spine - progressive loss - previously on Actonel >46yrs, indep stopped same - I recommend starting Prolia injections every 6 months in addition to ongoing Ca+D to help bone loss and reduce risk of fracture - diffenrent class of medications than Actonel or fosamax - our office will call once arranged/approved will recheck dexa in 2 years

## 2011-04-05 NOTE — Telephone Encounter (Signed)
Noted - I will forward to Ms Mora Bellman for benefit verification on Prolia as ordered - thanks

## 2011-04-05 NOTE — Telephone Encounter (Signed)
Notified pt with md response & recommendations. Pt agreed to take the prolia injection. Did inform pt that she will be call once med been arranged/approve....04/05/11@9 :09am/LMB

## 2011-04-06 ENCOUNTER — Telehealth: Payer: Self-pay | Admitting: *Deleted

## 2011-04-06 DIAGNOSIS — M810 Age-related osteoporosis without current pathological fracture: Secondary | ICD-10-CM

## 2011-04-06 NOTE — Telephone Encounter (Signed)
Pt call and was inquiring about needing calcium level being check prior to starting prolia. Spoke with md will need calcium. Entered BMET in epic. Is there anything else she need to have check....04/06/11@11 :42am/LMB

## 2011-04-06 NOTE — Telephone Encounter (Signed)
Calcium and creatinine to be checked on bmet, no other labs necessary

## 2011-04-08 MED ORDER — DENOSUMAB 60 MG/ML ~~LOC~~ SOLN: 60.0000 mg | SUBCUTANEOUS | Status: DC

## 2011-04-12 ENCOUNTER — Telehealth: Payer: Self-pay | Admitting: *Deleted

## 2011-04-12 NOTE — Telephone Encounter (Signed)
Called pt to see has she been on any other osteoporosis meds. Trying to get prolia approve with insurance. Pt states she has been on actonel for 7 years. She states was told by dr. Everardo All she could stop taking because med was not doing any good....04/12/11@4 :12pm/LMB

## 2011-04-20 ENCOUNTER — Telehealth: Payer: Self-pay | Admitting: *Deleted

## 2011-04-20 NOTE — Telephone Encounter (Signed)
Called pt no answer LMOM RTC concerning prolia info....04/20/11@3 :59pm/LMB

## 2011-04-20 NOTE — Telephone Encounter (Signed)
Message copied by Deatra James on Wed Apr 20, 2011  3:55 PM ------      Message from: Clover Mealy      Created: Tue Apr 19, 2011 11:05 AM      Regarding: Prolia Approval       Hi Valentina Gu,            I just received a call from [facility] & Mrs. Resch plan has approved the prior authorization for her Prolia injection.  (The authorization [id number] effective from 04/19/11 - 04/18/12).  Per Morrie Sheldon in the Benefits Dept there is $0 out-of-pocket.  The patient can be called for an appointment.  Just let me know her appointment date, please.              Thanks      [employer]

## 2011-04-25 ENCOUNTER — Telehealth: Payer: Self-pay | Admitting: *Deleted

## 2011-04-25 NOTE — Telephone Encounter (Signed)
Patient return call back. Requesting status of prolia. Has med been ordered yet? Pls advsie....04/25/11@1 :55pm/LMB

## 2011-04-25 NOTE — Telephone Encounter (Signed)
Prolia ordered

## 2011-04-25 NOTE — Telephone Encounter (Signed)
Notified pt insurance did approve prolia, med has been order soon as med come in will give her a call back to schedule injection...04/25/11@4 :54pm/LMB

## 2011-04-25 NOTE — Telephone Encounter (Signed)
Message copied by Deatra James on Mon Apr 25, 2011  1:54 PM ------      Message from: Deatra James      Created: Tue Apr 19, 2011  4:52 PM       This lady has been approve to get the prolia. Can you order one for her and i will call her to schedule once it come in.            Thanks [employer]

## 2011-04-26 ENCOUNTER — Other Ambulatory Visit (INDEPENDENT_AMBULATORY_CARE_PROVIDER_SITE_OTHER): Payer: Medicare Other

## 2011-04-26 DIAGNOSIS — M810 Age-related osteoporosis without current pathological fracture: Secondary | ICD-10-CM

## 2011-04-26 LAB — BASIC METABOLIC PANEL
CO2: 28 mEq/L (ref 19–32)
Calcium: 9.4 mg/dL (ref 8.4–10.5)
Creatinine, Ser: 0.9 mg/dL (ref 0.4–1.2)
Glucose, Bld: 97 mg/dL (ref 70–99)

## 2011-04-26 NOTE — Telephone Encounter (Signed)
Spoke with patient on Monday 04/25/11 insurance approve prolia waiting on med to come in. Inform pt once med is in will give her a call  bck to schedule injection....04/26/11@8 :03am/LMB

## 2011-05-02 ENCOUNTER — Ambulatory Visit (INDEPENDENT_AMBULATORY_CARE_PROVIDER_SITE_OTHER): Payer: Medicare Other | Admitting: *Deleted

## 2011-05-02 DIAGNOSIS — M810 Age-related osteoporosis without current pathological fracture: Secondary | ICD-10-CM

## 2011-05-02 MED ORDER — DENOSUMAB 60 MG/ML ~~LOC~~ SOLN
60.0000 mg | Freq: Once | SUBCUTANEOUS | Status: DC
  Administered 2011-05-02: 60 mg via SUBCUTANEOUS

## 2011-05-02 MED ORDER — DENOSUMAB 60 MG/ML ~~LOC~~ SOLN: 60.0000 mg | Freq: Once | SUBCUTANEOUS | Status: DC

## 2011-05-31 HISTORY — PX: CATARACT EXTRACTION W/ INTRAOCULAR LENS IMPLANT: SHX1309

## 2011-10-18 ENCOUNTER — Telehealth: Payer: Self-pay | Admitting: *Deleted

## 2011-10-18 NOTE — Telephone Encounter (Signed)
Message copied by Deatra James on Tue Oct 18, 2011 10:25 AM ------      Message from: Clover Mealy      Created: Tue Oct 18, 2011  9:54 AM       Hi Valentina Gu,            I've submitted the paperwork to Prolia this morning.  There should be no problem because we have a prior auth thru Nov '13.  I'll keep you posted.            Ruby      ----- Message -----         From: Deatra James, MA         Sent: 10/17/2011   2:58 PM           To: Crawford Givens [place]            Hey Mrs. Mora Bellman,            Mrs. Nickolas called and stated its time for her prolia shot. Let me know & i can call here to come & get it            [place]

## 2011-10-18 NOTE — Telephone Encounter (Signed)
Pt has been aware... 10/18/11@1 :30pm/LMB

## 2011-11-22 ENCOUNTER — Ambulatory Visit (INDEPENDENT_AMBULATORY_CARE_PROVIDER_SITE_OTHER): Payer: Medicare Other | Admitting: *Deleted

## 2011-11-22 DIAGNOSIS — M810 Age-related osteoporosis without current pathological fracture: Secondary | ICD-10-CM

## 2011-11-22 MED ORDER — DENOSUMAB 60 MG/ML ~~LOC~~ SOLN
60.0000 mg | Freq: Once | SUBCUTANEOUS | Status: AC
  Administered 2011-11-22: 60 mg via SUBCUTANEOUS

## 2012-03-05 ENCOUNTER — Other Ambulatory Visit (INDEPENDENT_AMBULATORY_CARE_PROVIDER_SITE_OTHER): Payer: Medicare Other

## 2012-03-05 ENCOUNTER — Encounter: Payer: Self-pay | Admitting: Internal Medicine

## 2012-03-05 ENCOUNTER — Ambulatory Visit (INDEPENDENT_AMBULATORY_CARE_PROVIDER_SITE_OTHER): Payer: Medicare Other | Admitting: Internal Medicine

## 2012-03-05 VITALS — BP 110/72 | HR 72 | Temp 98.3°F | Ht 61.5 in | Wt 124.0 lb

## 2012-03-05 DIAGNOSIS — E042 Nontoxic multinodular goiter: Secondary | ICD-10-CM

## 2012-03-05 DIAGNOSIS — R5381 Other malaise: Secondary | ICD-10-CM

## 2012-03-05 DIAGNOSIS — M810 Age-related osteoporosis without current pathological fracture: Secondary | ICD-10-CM

## 2012-03-05 DIAGNOSIS — Z23 Encounter for immunization: Secondary | ICD-10-CM

## 2012-03-05 DIAGNOSIS — R5383 Other fatigue: Secondary | ICD-10-CM

## 2012-03-05 DIAGNOSIS — E785 Hyperlipidemia, unspecified: Secondary | ICD-10-CM

## 2012-03-05 DIAGNOSIS — Z0000 Encounter for general adult medical examination without abnormal findings: Secondary | ICD-10-CM

## 2012-03-05 LAB — LDL CHOLESTEROL, DIRECT: Direct LDL: 141.6 mg/dL

## 2012-03-05 LAB — HEPATIC FUNCTION PANEL
Alkaline Phosphatase: 41 U/L (ref 39–117)
Bilirubin, Direct: 0.1 mg/dL (ref 0.0–0.3)
Total Bilirubin: 0.5 mg/dL (ref 0.3–1.2)
Total Protein: 6.9 g/dL (ref 6.0–8.3)

## 2012-03-05 LAB — CBC WITH DIFFERENTIAL/PLATELET
Basophils Absolute: 0 10*3/uL (ref 0.0–0.1)
Eosinophils Relative: 2.1 % (ref 0.0–5.0)
HCT: 41 % (ref 36.0–46.0)
Lymphocytes Relative: 17.1 % (ref 12.0–46.0)
Lymphs Abs: 1.6 10*3/uL (ref 0.7–4.0)
Monocytes Relative: 8.9 % (ref 3.0–12.0)
Neutrophils Relative %: 71.7 % (ref 43.0–77.0)
Platelets: 290 10*3/uL (ref 150.0–400.0)
RDW: 12.9 % (ref 11.5–14.6)
WBC: 9.2 10*3/uL (ref 4.5–10.5)

## 2012-03-05 LAB — BASIC METABOLIC PANEL
BUN: 12 mg/dL (ref 6–23)
Chloride: 105 mEq/L (ref 96–112)
Creatinine, Ser: 0.8 mg/dL (ref 0.4–1.2)
Glucose, Bld: 76 mg/dL (ref 70–99)

## 2012-03-05 LAB — LIPID PANEL
Total CHOL/HDL Ratio: 3
Triglycerides: 157 mg/dL — ABNORMAL HIGH (ref 0.0–149.0)

## 2012-03-05 NOTE — Assessment & Plan Note (Signed)
controls with diet/exercise

## 2012-03-05 NOTE — Progress Notes (Signed)
Subjective:    Patient ID: Jasmine Esparza, female    DOB: 1941/01/19, 71 y.o.   MRN: [id number]  HPI  Here for medicare wellness  Diet: heart healthy  Physical activity: sedentary Depression/mood screen: negative Hearing: intact to whispered voice Visual acuity: grossly normal, performs annual eye exam  ADLs: capable Fall risk: none Home safety: good Cognitive evaluation: intact to orientation, naming, recall and repetition EOL planning: adv directives, full code/ I agree  I have personally reviewed and have noted 1. The patient's medical and social history 2. Their use of alcohol, tobacco or illicit drugs 3. Their current medications and supplements 4. The patient's functional ability including ADL's, fall risks, home safety risks and hearing or visual impairment. 5. Diet and physical activities 6. Evidence for depression or mood disorders  Also reviewed chronic medical issues: thyroid multinodular goiter - no following with endo s/p I -131 tx 10/2009 - denies mood or weight or bowel changes - previously followed with endo kerr and s/p second opinion re: mgmt summer 2011 ([facility])   dyslipidemia -  never on tx for same - since last check, has changed to low fat diet and tried to inc exercise (ballroom dancing and Zumba classes) -   Osteoporosis -  prev on actonel >27yrs stopped indep 2010; started Prolia 04/2011 - no hx fx known - stopped bisphos tx after reading reports on risk of tx > 71yr - she denies: prolonged bedrest, urolithiasis, seizures, dvt/pe, renal dz, and liver dz.   Past Medical History  Diagnosis Date  . ALLERGIC RHINITIS   . ECZEMA   . GOITER, MULTINODULAR   . Hypercalcemia   . HYPERLIPIDEMIA   . OSTEOPOROSIS    Family History  Problem Relation Age of Onset  . Stroke Mother   . Arthritis Mother   . Hypertension Mother   . Prostate cancer Father   . Arthritis Father   . Hypertension Sister   . Osteoporosis Sister   . Hypertension Brother   .  Hyperlipidemia Brother    History  Substance Use Topics  . Smoking status: Never Smoker   . Smokeless tobacco: Not on file   Comment: Married, lives with spouse. retired  . Alcohol Use: No    Review of Systems Constitutional: Negative for fever or weight change. Positive for fatigue Respiratory: Negative for cough and shortness of breath.   Cardiovascular: Negative for chest pain or palpitations.  Gastrointestinal: Negative for abdominal pain, no bowel changes.  Musculoskeletal: Negative for gait problem or joint swelling.  Skin: Negative for rash.  Neurological: Negative for dizziness or headache.  No other specific complaints in a complete review of systems (except as listed in HPI above).     Objective:   Physical Exam BP 110/72  Pulse 72  Temp 98.3 F (36.8 C) (Oral)  Ht 5' 1.5" (1.562 m)  Wt 124 lb (56.246 kg)  BMI 23.05 kg/m2  SpO2 98% Wt Readings from Last 3 Encounters:  03/05/12 124 lb (56.246 kg)  05/28/10 118 lb 6.1 oz (53.697 kg)  12/29/09 120 lb 8 oz (54.658 kg)   Constitutional: She appears well-developed and well-nourished. No distress.  HENT: Head: Normocephalic and atraumatic. Ears: B TMs ok, no erythema or effusion; Nose: Nose normal. Mouth/Throat: Oropharynx is clear and moist. No oropharyngeal exudate.  Eyes: Conjunctivae and EOM are normal. Pupils are equal, round, and reactive to light. No scleral icterus.  Neck: Normal range of motion. Neck supple. No JVD present. No thyromegaly or nodules present.  Cardiovascular: Normal rate, regular rhythm and normal heart sounds.  No murmur heard. No BLE edema. Pulmonary/Chest: Effort normal and breath sounds normal. No respiratory distress. She has no wheezes.  Abdominal: Soft. Bowel sounds are normal. She exhibits no distension. There is no tenderness. no masses Musculoskeletal: Normal range of motion, no joint effusions. No gross deformities Neurological: She is alert and oriented to person, place, and time. No  cranial nerve deficit. Coordination normal.  Skin: Skin is warm and dry. No rash noted. No erythema.  Psychiatric: She has a normal mood and affect. Her behavior is normal. Judgment and thought content normal.   Lab Results  Component Value Date   WBC 6.1 12/01/2009   HGB 14.4 12/01/2009   HCT 41.5 12/01/2009   PLT 255.0 12/01/2009   GLUCOSE 97 04/26/2011   CHOL 221* 06/01/2010   TRIG 73.0 06/01/2010   HDL 71.80 06/01/2010   LDLDIRECT 128.3 06/01/2010   ALT 16 12/01/2009   AST 24 12/01/2009   NA 140 04/26/2011   K 4.4 04/26/2011   CL 105 04/26/2011   CREATININE 0.9 04/26/2011   BUN 15 04/26/2011   CO2 28 04/26/2011   TSH 1.58 06/01/2010        Assessment & Plan:   AWV/v70.0 - Today patient counseled on age appropriate routine health concerns for screening and prevention, each reviewed and up to date or declined. Immunizations reviewed and up to date or declined. Labs/ECG reviewed. Risk factors for depression reviewed and negative. Hearing function and visual acuity are intact. ADLs screened and addressed as needed. Functional ability and level of safety reviewed and appropriate. Education, counseling and referrals performed based on assessed risks today. Patient provided with a copy of personalized plan for preventive services.  Fatigue - nonspecific symptoms/exam - check screening labs  Also see problem list. Medications and labs reviewed today.

## 2012-03-05 NOTE — Patient Instructions (Signed)
It was good to see you today. We have reviewed your prior records including labs and tests today Health Maintenance reviewed - flu shot today - consoider pneumonia shot and Shingles shot in the future but delay these today as requested - all other recommended immunizations and age-appropriate screenings are up-to-date. Test(s) ordered today. Your results will be released to MyChart (or called to you) after review, usually within 72hours after test completion. If any changes need to be made, you will be notified at that same time. we'll make referral for thyroid ultrasound . Our office will contact you regarding appointment(s) once made. Medications reviewed, no changes at this time. Please schedule followup in 1 year, call sooner if problems. Health Maintenance, Females A healthy lifestyle and preventative care can promote health and wellness.  Maintain regular health, dental, and eye exams.   Eat a healthy diet. Foods like vegetables, fruits, whole grains, low-fat dairy products, and lean protein foods contain the nutrients you need without too many calories. Decrease your intake of foods high in solid fats, added sugars, and salt. Get information about a proper diet from your caregiver, if necessary.   Regular physical exercise is one of the most important things you can do for your health. Most adults should get at least 150 minutes of moderate-intensity exercise (any activity that increases your heart rate and causes you to sweat) each week. In addition, most adults need muscle-strengthening exercises on 2 or more days a week.     Maintain a healthy weight. The body mass index (BMI) is a screening tool to identify possible weight problems. It provides an estimate of body fat based on height and weight. Your caregiver can help determine your BMI, and can help you achieve or maintain a healthy weight. For adults 20 years and older:   A BMI below 18.5 is considered underweight.   A BMI of 18.5  to 24.9 is normal.   A BMI of 25 to 29.9 is considered overweight.   A BMI of 30 and above is considered obese.   Maintain normal blood lipids and cholesterol by exercising and minimizing your intake of saturated fat. Eat a balanced diet with plenty of fruits and vegetables. Blood tests for lipids and cholesterol should begin at age 44 and be repeated every 5 years. If your lipid or cholesterol levels are high, you are over 50, or you are a high risk for heart disease, you may need your cholesterol levels checked more frequently. Ongoing high lipid and cholesterol levels should be treated with medicines if diet and exercise are not effective.   If you smoke, find out from your caregiver how to quit. If you do not use tobacco, do not start.   If you are pregnant, do not drink alcohol. If you are breastfeeding, be very cautious about drinking alcohol. If you are not pregnant and choose to drink alcohol, do not exceed 1 drink per day. One drink is considered to be 12 ounces (355 mL) of beer, 5 ounces (148 mL) of wine, or 1.5 ounces (44 mL) of liquor.   Avoid use of street drugs. Do not share needles with anyone. Ask for help if you need support or instructions about stopping the use of drugs.   High blood pressure causes heart disease and increases the risk of stroke. Blood pressure should be checked at least every 1 to 2 years. Ongoing high blood pressure should be treated with medicines, if weight loss and exercise are not effective.  If you are 55 to 69 years old, ask your caregiver if you should take aspirin to prevent strokes.   Diabetes screening involves taking a blood sample to check your fasting blood sugar level. This should be done once every 3 years, after age 54, if you are within normal weight and without risk factors for diabetes. Testing should be considered at a younger age or be carried out more frequently if you are overweight and have at least 1 risk factor for diabetes.    Breast cancer screening is essential preventative care for women. You should practice "breast self-awareness." This means understanding the normal appearance and feel of your breasts and may include breast self-examination. Any changes detected, no matter how small, should be reported to a caregiver. Women in their 46s and 30s should have a clinical breast exam (CBE) by a caregiver as part of a regular health exam every 1 to 3 years. After age 34, women should have a CBE every year. Starting at age 65, women should consider having a mammogram (breast X-ray) every year. Women who have a family history of breast cancer should talk to their caregiver about genetic screening. Women at a high risk of breast cancer should talk to their caregiver about having an MRI and a mammogram every year.   The Pap test is a screening test for cervical cancer. Women should have a Pap test starting at age 17. Between ages 45 and 53, Pap tests should be repeated every 2 years. Beginning at age 88, you should have a Pap test every 3 years as long as the past 3 Pap tests have been normal. If you had a hysterectomy for a problem that was not cancer or a condition that could lead to cancer, then you no longer need Pap tests. If you are between ages 2 and 38, and you have had normal Pap tests going back 10 years, you no longer need Pap tests. If you have had past treatment for cervical cancer or a condition that could lead to cancer, you need Pap tests and screening for cancer for at least 20 years after your treatment. If Pap tests have been discontinued, risk factors (such as a new sexual partner) need to be reassessed to determine if screening should be resumed. Some women have medical problems that increase the chance of getting cervical cancer. In these cases, your caregiver may recommend more frequent screening and Pap tests.   The human papillomavirus (HPV) test is an additional test that may be used for cervical cancer  screening. The HPV test looks for the virus that can cause the cell changes on the cervix. The cells collected during the Pap test can be tested for HPV. The HPV test could be used to screen women aged 72 years and older, and should be used in women of any age who have unclear Pap test results. After the age of 44, women should have HPV testing at the same frequency as a Pap test.   Colorectal cancer can be detected and often prevented. Most routine colorectal cancer screening begins at the age of 66 and continues through age 65. However, your caregiver may recommend screening at an earlier age if you have risk factors for colon cancer. On a yearly basis, your caregiver may provide home test kits to check for hidden blood in the stool. Use of a small camera at the end of a tube, to directly examine the colon (sigmoidoscopy or colonoscopy), can detect the earliest forms of  colorectal cancer. Talk to your caregiver about this at age 49, when routine screening begins. Direct examination of the colon should be repeated every 5 to 10 years through age 14, unless early forms of pre-cancerous polyps or small growths are found.   Hepatitis C blood testing is recommended for all people born from [birth year] through 1963 and any individual with known risks for hepatitis C.   Practice safe sex. Use condoms and avoid high-risk sexual practices to reduce the spread of sexually transmitted infections (STIs). Sexually active women aged 61 and younger should be checked for Chlamydia, which is a common sexually transmitted infection. Older women with new or multiple partners should also be tested for Chlamydia. Testing for other STIs is recommended if you are sexually active and at increased risk.   Osteoporosis is a disease in which the bones lose minerals and strength with aging. This can result in serious bone fractures. The risk of osteoporosis can be identified using a bone density scan. Women ages 78 and over and women at  risk for fractures or osteoporosis should discuss screening with their caregivers. Ask your caregiver whether you should be taking a calcium supplement or vitamin D to reduce the rate of osteoporosis.   Menopause can be associated with physical symptoms and risks. Hormone replacement therapy is available to decrease symptoms and risks. You should talk to your caregiver about whether hormone replacement therapy is right for you.   Use sunscreen with a sun protection factor (SPF) of 30 or greater. Apply sunscreen liberally and repeatedly throughout the day. You should seek shade when your shadow is shorter than you. Protect yourself by wearing long sleeves, pants, a wide-brimmed hat, and sunglasses year round, whenever you are outdoors.   Notify your caregiver of new moles or changes in moles, especially if there is a change in shape or color. Also notify your caregiver if a mole is larger than the size of a pencil eraser.   Stay current with your immunizations.  Document Released: 11/29/2010 Document Revised: 08/08/2011 Document Reviewed: 11/29/2010 [facility] Patient Information 2013 [place], [place].   Osteoporosis Throughout your life, your body breaks down old bone and replaces it with new bone. As you get older, your body does not replace bone as quickly as it breaks it down. By the age of 30 years, most people begin to gradually lose bone because of the imbalance between bone loss and replacement. Some people lose more bone than others. Bone loss beyond a specified normal degree is considered osteoporosis.   Osteoporosis affects the strength and durability of your bones. The inside of the ends of your bones and your flat bones, like the bones of your pelvis, look like honeycomb, filled with tiny open spaces. As bone loss occurs, your bones become less dense. This means that the open spaces inside your bones become bigger and the walls between these spaces become thinner. This makes your bones weaker.  Bones of a person with osteoporosis can become so weak that they can break (fracture) during minor accidents, such as a simple fall. CAUSES   The following factors have been associated with the development of osteoporosis:  Smoking.   Drinking more than 2 alcoholic drinks several days per week.   Long-term use of certain medicines:   Corticosteroids.   Chemotherapy medicines.   Thyroid medicines.   Antiepileptic medicines.   Gonadal hormone suppression medicine.   Immunosuppression medicine.   Being underweight.   Lack of physical activity.   Lack of  exposure to the sun. This can lead to vitamin D deficiency.   Certain medical conditions:   Certain inflammatory bowel diseases, such as Crohn's disease and ulcerative colitis.   Diabetes.   Hyperthyroidism.   Hyperparathyroidism.  RISK FACTORS Anyone can develop osteoporosis. However, the following factors can increase your risk of developing osteoporosis:  Gender Women are at higher risk than men.   Age Being older than 50 years increases your risk.   Ethnicity White and Asian people have an increased risk.   Weight Being extremely underweight can increase your risk of osteoporosis.   Family history of osteoporosis Having a family member who has developed osteoporosis can increase your risk.  SYMPTOMS   Usually, people with osteoporosis have no symptoms.   DIAGNOSIS   Signs during a physical exam that may prompt your caregiver to suspect osteoporosis include:  Decreased height. This is usually caused by the compression of the bones that form your spine (vertebrae) because they have weakened and become fractured.   A curving or rounding of the upper back (kyphosis).  To confirm signs of osteoporosis, your caregiver may request a procedure that uses 2 low-dose X-ray beams with different levels of energy to measure your bone mineral density (dual-energy X-ray absorptiometry [DXA]). Also, your caregiver may check  your level of vitamin D. TREATMENT   The goal of osteoporosis treatment is to strengthen bones in order to decrease the risk of bone fractures. There are different types of medicines available to help achieve this goal. Some of these medicines work by slowing the processes of bone loss. Some medicines work by increasing bone density. Treatment also involves making sure that your levels of calcium and vitamin D are adequate. PREVENTION   There are things you can do to help prevent osteoporosis. Adequate intake of calcium and vitamin D can help you achieve optimal bone mineral density. Regular exercise can also help, especially resistance and high-impact activities. If you smoke, quitting smoking is an important part of osteoporosis prevention. MAKE SURE YOU:  Understand these instructions.   Will watch your condition.   Will get help right away if you are not doing well or get worse.  Document Released: 02/23/2005 Document Revised: 08/08/2011 Document Reviewed: 04/30/2011 [facility] Patient Information 2013 [place], [place].

## 2012-03-05 NOTE — Assessment & Plan Note (Signed)
Prior treatment with Actonel ineffective - progressive loss on DEXA 02/2011: -3.1 Started Prolia 04/2011 - continue every 25mo

## 2012-03-05 NOTE — Assessment & Plan Note (Signed)
S/p I-131 ablation 10/2009 - previously followed with endo Sharl Ma and SAE but no OV in >81mo Recheck labs and [place] now

## 2012-03-09 ENCOUNTER — Other Ambulatory Visit: Payer: Medicare Other

## 2012-03-12 ENCOUNTER — Ambulatory Visit
Admission: RE | Admit: 2012-03-12 | Discharge: 2012-03-12 | Disposition: A | Discharge status: Home / Self Care | Payer: Medicare Other | Source: Ambulatory Visit | Attending: Internal Medicine | Admitting: Internal Medicine

## 2012-03-12 DIAGNOSIS — E042 Nontoxic multinodular goiter: Secondary | ICD-10-CM

## 2012-06-28 ENCOUNTER — Ambulatory Visit (INDEPENDENT_AMBULATORY_CARE_PROVIDER_SITE_OTHER): Payer: Medicare Other | Admitting: *Deleted

## 2012-06-28 DIAGNOSIS — M810 Age-related osteoporosis without current pathological fracture: Secondary | ICD-10-CM

## 2012-06-28 MED ORDER — DENOSUMAB 60 MG/ML ~~LOC~~ SOLN
60.0000 mg | Freq: Once | SUBCUTANEOUS | Status: AC
  Administered 2012-06-28: 60 mg via SUBCUTANEOUS

## 2013-01-02 ENCOUNTER — Other Ambulatory Visit: Payer: Self-pay

## 2013-01-09 ENCOUNTER — Ambulatory Visit (INDEPENDENT_AMBULATORY_CARE_PROVIDER_SITE_OTHER): Payer: Medicare Other | Admitting: *Deleted

## 2013-01-09 DIAGNOSIS — M810 Age-related osteoporosis without current pathological fracture: Secondary | ICD-10-CM

## 2013-01-09 MED ORDER — DENOSUMAB 60 MG/ML ~~LOC~~ SOLN
60.0000 mg | Freq: Once | SUBCUTANEOUS | Status: AC
  Administered 2013-01-09: 60 mg via SUBCUTANEOUS

## 2013-02-28 ENCOUNTER — Ambulatory Visit (INDEPENDENT_AMBULATORY_CARE_PROVIDER_SITE_OTHER): Payer: Medicare Other

## 2013-02-28 DIAGNOSIS — Z23 Encounter for immunization: Secondary | ICD-10-CM

## 2013-03-11 ENCOUNTER — Encounter: Payer: Self-pay | Admitting: Internal Medicine

## 2013-03-11 ENCOUNTER — Other Ambulatory Visit (INDEPENDENT_AMBULATORY_CARE_PROVIDER_SITE_OTHER): Payer: Medicare Other

## 2013-03-11 ENCOUNTER — Ambulatory Visit (INDEPENDENT_AMBULATORY_CARE_PROVIDER_SITE_OTHER): Payer: Medicare Other | Admitting: Internal Medicine

## 2013-03-11 VITALS — BP 110/70 | HR 74 | Temp 98.5°F | Wt 116.8 lb

## 2013-03-11 DIAGNOSIS — E042 Nontoxic multinodular goiter: Secondary | ICD-10-CM

## 2013-03-11 DIAGNOSIS — R5381 Other malaise: Secondary | ICD-10-CM

## 2013-03-11 DIAGNOSIS — R5383 Other fatigue: Secondary | ICD-10-CM

## 2013-03-11 DIAGNOSIS — E785 Hyperlipidemia, unspecified: Secondary | ICD-10-CM

## 2013-03-11 DIAGNOSIS — Z23 Encounter for immunization: Secondary | ICD-10-CM

## 2013-03-11 DIAGNOSIS — M810 Age-related osteoporosis without current pathological fracture: Secondary | ICD-10-CM

## 2013-03-11 DIAGNOSIS — Z0000 Encounter for general adult medical examination without abnormal findings: Secondary | ICD-10-CM

## 2013-03-11 DIAGNOSIS — Z136 Encounter for screening for cardiovascular disorders: Secondary | ICD-10-CM

## 2013-03-11 LAB — LIPID PANEL
Cholesterol: 204 mg/dL — ABNORMAL HIGH (ref 0–200)
VLDL: 12.4 mg/dL (ref 0.0–40.0)

## 2013-03-11 LAB — CBC WITH DIFFERENTIAL/PLATELET
Basophils Absolute: 0 10*3/uL (ref 0.0–0.1)
Eosinophils Absolute: 0.1 10*3/uL (ref 0.0–0.7)
MCHC: 33.8 g/dL (ref 30.0–36.0)
MCV: 91.2 fl (ref 78.0–100.0)
Monocytes Absolute: 0.7 10*3/uL (ref 0.1–1.0)
Neutrophils Relative %: 63.7 % (ref 43.0–77.0)
Platelets: 277 10*3/uL (ref 150.0–400.0)
RDW: 13 % (ref 11.5–14.6)
WBC: 6.6 10*3/uL (ref 4.5–10.5)

## 2013-03-11 LAB — BASIC METABOLIC PANEL
BUN: 18 mg/dL (ref 6–23)
Chloride: 102 mEq/L (ref 96–112)
Creatinine, Ser: 0.8 mg/dL (ref 0.4–1.2)

## 2013-03-11 LAB — TSH: TSH: 1.03 u[IU]/mL (ref 0.35–5.50)

## 2013-03-11 LAB — LDL CHOLESTEROL, DIRECT: Direct LDL: 114.9 mg/dL

## 2013-03-11 NOTE — Assessment & Plan Note (Signed)
Prior treatment with Actonel ineffective -  progressive loss on DEXA 02/2011: -3.1([facility]) -due for recheck now Started Prolia 04/2011 - continue every 5mo Also Ca+D

## 2013-03-11 NOTE — Assessment & Plan Note (Signed)
S/p I-131 ablation 10/2009 -  previously followed with endo Sharl Ma and SAE but no OV in >88mo Recheck labs and [place] annually - last [place] reviewed

## 2013-03-11 NOTE — Assessment & Plan Note (Signed)
controls with diet/exercise Check lipids annually, consider treatment as needed

## 2013-03-11 NOTE — Progress Notes (Signed)
Subjective:    Patient ID: Jasmine Esparza, female    DOB: 1941/02/12, 72 y.o.   MRN: [id number]  HPI  Here for medicare wellness  Diet: heart healthy  Physical activity: sedentary Depression/mood screen: negative Hearing: intact to whispered voice Visual acuity: grossly normal, performs annual eye exam  ADLs: capable Fall risk: none Home safety: good Cognitive evaluation: intact to orientation, naming, recall and repetition EOL planning: adv directives, full code/ I agree  I have personally reviewed and have noted 1. The patient's medical and social history 2. Their use of alcohol, tobacco or illicit drugs 3. Their current medications and supplements 4. The patient's functional ability including ADL's, fall risks, home safety risks and hearing or visual impairment. 5. Diet and physical activities 6. Evidence for depression or mood disorders  Also reviewed chronic medical issues: thyroid multinodular goiter - now following with endo s/p I -131 tx 10/2009 - denies mood or weight or bowel changes - previously followed with endo kerr and s/p second opinion re: mgmt summer 2011 ([facility])   dyslipidemia -  never on tx for same - since last check, has changed to low fat diet and tried to inc exercise (ballroom dancing and Zumba classes) -   Osteoporosis -  prev on actonel >72yrs stopped indep 2010; started Prolia 04/2011 - no hx fx known - stopped bisphos tx after reading reports on risk of tx > 27yr - she denies: prolonged bedrest, urolithiasis, seizures, dvt/pe, renal dz, and liver dz.   Past Medical History  Diagnosis Date  . ALLERGIC RHINITIS   . ECZEMA   . GOITER, MULTINODULAR     s/p I-131 ablation 10/2009  . Hypercalcemia   . HYPERLIPIDEMIA   . OSTEOPOROSIS    Family History  Problem Relation Age of Onset  . Stroke Mother   . Arthritis Mother   . Hypertension Mother   . Prostate cancer Father   . Arthritis Father   . Hypertension Sister   . Osteoporosis Sister   .  Hypertension Brother   . Hyperlipidemia Brother    History  Substance Use Topics  . Smoking status: Never Smoker   . Smokeless tobacco: Not on file     Comment: Married, lives with spouse. retired  . Alcohol Use: No    Review of Systems Constitutional: Negative for fever or weight change. Positive for fatigue Respiratory: Negative for cough and shortness of breath.   Cardiovascular: Negative for chest pain or palpitations.  Gastrointestinal: Negative for abdominal pain, no bowel changes.  Musculoskeletal: Negative for gait problem or joint swelling.  Skin: Negative for rash.  Neurological: Negative for dizziness or headache.  No other specific complaints in a complete review of systems (except as listed in HPI above).     Objective:   Physical Exam BP 110/70  Pulse 74  Temp(Src) 98.5 F (36.9 C) (Oral)  Wt 116 lb 12.8 oz (52.98 kg)  BMI 21.71 kg/m2  SpO2 98% Wt Readings from Last 3 Encounters:  03/11/13 116 lb 12.8 oz (52.98 kg)  03/05/12 124 lb (56.246 kg)  05/28/10 118 lb 6.1 oz (53.697 kg)   Constitutional: She appears well-developed and well-nourished. No distress.  HENT: Head: Normocephalic and atraumatic. Ears: B TMs ok, no erythema or effusion; Nose: Nose normal. Mouth/Throat: Oropharynx is clear and moist. No oropharyngeal exudate.  Eyes: Conjunctivae and EOM are normal. Pupils are equal, round, and reactive to light. No scleral icterus.  Neck: Normal range of motion. Neck supple. No JVD present. No  thyromegaly or nodules present.  Cardiovascular: Normal rate, regular rhythm and normal heart sounds.  No murmur heard. No BLE edema. Pulmonary/Chest: Effort normal and breath sounds normal. No respiratory distress. She has no wheezes.  Abdominal: Soft. Bowel sounds are normal. She exhibits no distension. There is no tenderness. no masses Musculoskeletal: Normal range of motion, no joint effusions. No gross deformities Neurological: She is alert and oriented to person,  place, and time. No cranial nerve deficit. Coordination normal.  Skin: Skin is warm and dry. No rash noted. No erythema.  Psychiatric: She has a normal mood and affect. Her behavior is normal. Judgment and thought content normal.   Lab Results  Component Value Date   WBC 9.2 03/05/2012   HGB 13.5 03/05/2012   HCT 41.0 03/05/2012   PLT 290.0 03/05/2012   GLUCOSE 76 03/05/2012   CHOL 251* 03/05/2012   TRIG 157.0* 03/05/2012   HDL 75.80 03/05/2012   LDLDIRECT 141.6 03/05/2012   ALT 19 03/05/2012   AST 22 03/05/2012   NA 140 03/05/2012   K 3.9 03/05/2012   CL 105 03/05/2012   CREATININE 0.8 03/05/2012   BUN 12 03/05/2012   CO2 29 03/05/2012   TSH 1.77 03/05/2012   Thyroid ultrasound 03/05/2012: IMPRESSION:  Numerous bilateral thyroid nodules with minimal change. Possible  slight enlargement of the left lower pole nodule which is now 2.4  cm maximally compared 2.1 cm previously. Recommend continued  follow up.  ECG: normal sinus at 73 beats per minute. No ST T changes or arrhythmia    Assessment & Plan:   AWV/v70.0 - Today patient counseled on age appropriate routine health concerns for screening and prevention, each reviewed and up to date or declined. Immunizations reviewed and up to date or declined. Labs/ECG reviewed. Risk factors for depression reviewed and negative. Hearing function and visual acuity are intact. ADLs screened and addressed as needed. Functional ability and level of safety reviewed and appropriate. Education, counseling and referrals performed based on assessed risks today. Patient provided with a copy of personalized plan for preventive services.  Fatigue - nonspecific symptoms/exam - check screening labs  Also see problem list. Medications and labs reviewed today.

## 2013-03-11 NOTE — Patient Instructions (Addendum)
It was good to see you today.  We have reviewed your prior records including labs and tests today  Pneumonia vaccine updated today  Health Maintenance reviewed - Followup for bone density scan as scheduled - all other recommended immunizations and age-appropriate screenings are up-to-date.  Test(s) ordered today. Your results will be released to MyChart (or called to you) after review, usually within 72hours after test completion. If any changes need to be made, you will be notified at that same time.  we'll make referral for thyroid ultrasound. Our office will contact you regarding appointment(s) once made.  Medications reviewed and updated - no changes at this time.  Please schedule followup in 1 year for annual exam, call sooner if problems.  Health Maintenance, Females A healthy lifestyle and preventative care can promote health and wellness.  Maintain regular health, dental, and eye exams.  Eat a healthy diet. Foods like vegetables, fruits, whole grains, low-fat dairy products, and lean protein foods contain the nutrients you need without too many calories. Decrease your intake of foods high in solid fats, added sugars, and salt. Get information about a proper diet from your caregiver, if necessary.  Regular physical exercise is one of the most important things you can do for your health. Most adults should get at least 150 minutes of moderate-intensity exercise (any activity that increases your heart rate and causes you to sweat) each week. In addition, most adults need muscle-strengthening exercises on 2 or more days a week.   Maintain a healthy weight. The body mass index (BMI) is a screening tool to identify possible weight problems. It provides an estimate of body fat based on height and weight. Your caregiver can help determine your BMI, and can help you achieve or maintain a healthy weight. For adults 20 years and older:  A BMI below 18.5 is considered underweight.  A BMI of  18.5 to 24.9 is normal.  A BMI of 25 to 29.9 is considered overweight.  A BMI of 30 and above is considered obese.  Maintain normal blood lipids and cholesterol by exercising and minimizing your intake of saturated fat. Eat a balanced diet with plenty of fruits and vegetables. Blood tests for lipids and cholesterol should begin at age 41 and be repeated every 5 years. If your lipid or cholesterol levels are high, you are over 50, or you are a high risk for heart disease, you may need your cholesterol levels checked more frequently.Ongoing high lipid and cholesterol levels should be treated with medicines if diet and exercise are not effective.  If you smoke, find out from your caregiver how to quit. If you do not use tobacco, do not start.  If you are pregnant, do not drink alcohol. If you are breastfeeding, be very cautious about drinking alcohol. If you are not pregnant and choose to drink alcohol, do not exceed 1 drink per day. One drink is considered to be 12 ounces (355 mL) of beer, 5 ounces (148 mL) of wine, or 1.5 ounces (44 mL) of liquor.  Avoid use of street drugs. Do not share needles with anyone. Ask for help if you need support or instructions about stopping the use of drugs.  High blood pressure causes heart disease and increases the risk of stroke. Blood pressure should be checked at least every 1 to 2 years. Ongoing high blood pressure should be treated with medicines, if weight loss and exercise are not effective.  If you are 5 to 70 years old, ask your  caregiver if you should take aspirin to prevent strokes.  Diabetes screening involves taking a blood sample to check your fasting blood sugar level. This should be done once every 3 years, after age 22, if you are within normal weight and without risk factors for diabetes. Testing should be considered at a younger age or be carried out more frequently if you are overweight and have at least 1 risk factor for diabetes.  Breast  cancer screening is essential preventative care for women. You should practice "breast self-awareness." This means understanding the normal appearance and feel of your breasts and may include breast self-examination. Any changes detected, no matter how small, should be reported to a caregiver. Women in their 87s and 30s should have a clinical breast exam (CBE) by a caregiver as part of a regular health exam every 1 to 3 years. After age 1, women should have a CBE every year. Starting at age 30, women should consider having a mammogram (breast X-ray) every year. Women who have a family history of breast cancer should talk to their caregiver about genetic screening. Women at a high risk of breast cancer should talk to their caregiver about having an MRI and a mammogram every year.  The Pap test is a screening test for cervical cancer. Women should have a Pap test starting at age 23. Between ages 59 and 49, Pap tests should be repeated every 2 years. Beginning at age 5, you should have a Pap test every 3 years as long as the past 3 Pap tests have been normal. If you had a hysterectomy for a problem that was not cancer or a condition that could lead to cancer, then you no longer need Pap tests. If you are between ages 58 and 16, and you have had normal Pap tests going back 10 years, you no longer need Pap tests. If you have had past treatment for cervical cancer or a condition that could lead to cancer, you need Pap tests and screening for cancer for at least 20 years after your treatment. If Pap tests have been discontinued, risk factors (such as a new sexual partner) need to be reassessed to determine if screening should be resumed. Some women have medical problems that increase the chance of getting cervical cancer. In these cases, your caregiver may recommend more frequent screening and Pap tests.  The human papillomavirus (HPV) test is an additional test that may be used for cervical cancer screening. The HPV  test looks for the virus that can cause the cell changes on the cervix. The cells collected during the Pap test can be tested for HPV. The HPV test could be used to screen women aged 20 years and older, and should be used in women of any age who have unclear Pap test results. After the age of 5, women should have HPV testing at the same frequency as a Pap test.  Colorectal cancer can be detected and often prevented. Most routine colorectal cancer screening begins at the age of 60 and continues through age 20. However, your caregiver may recommend screening at an earlier age if you have risk factors for colon cancer. On a yearly basis, your caregiver may provide home test kits to check for hidden blood in the stool. Use of a small camera at the end of a tube, to directly examine the colon (sigmoidoscopy or colonoscopy), can detect the earliest forms of colorectal cancer. Talk to your caregiver about this at age 40, when routine screening begins.  Direct examination of the colon should be repeated every 5 to 10 years through age 79, unless early forms of pre-cancerous polyps or small growths are found.  Hepatitis C blood testing is recommended for all people born from [birth year] through 1963 and any individual with known risks for hepatitis C.  Practice safe sex. Use condoms and avoid high-risk sexual practices to reduce the spread of sexually transmitted infections (STIs). Sexually active women aged 50 and younger should be checked for Chlamydia, which is a common sexually transmitted infection. Older women with new or multiple partners should also be tested for Chlamydia. Testing for other STIs is recommended if you are sexually active and at increased risk.  Osteoporosis is a disease in which the bones lose minerals and strength with aging. This can result in serious bone fractures. The risk of osteoporosis can be identified using a bone density scan. Women ages 65 and over and women at risk for fractures or  osteoporosis should discuss screening with their caregivers. Ask your caregiver whether you should be taking a calcium supplement or vitamin D to reduce the rate of osteoporosis.  Menopause can be associated with physical symptoms and risks. Hormone replacement therapy is available to decrease symptoms and risks. You should talk to your caregiver about whether hormone replacement therapy is right for you.  Use sunscreen with a sun protection factor (SPF) of 30 or greater. Apply sunscreen liberally and repeatedly throughout the day. You should seek shade when your shadow is shorter than you. Protect yourself by wearing long sleeves, pants, a wide-brimmed hat, and sunglasses year round, whenever you are outdoors.  Notify your caregiver of new moles or changes in moles, especially if there is a change in shape or color. Also notify your caregiver if a mole is larger than the size of a pencil eraser.  Stay current with your immunizations. Document Released: 11/29/2010 Document Revised: 08/08/2011 Document Reviewed: 11/29/2010 [facility] Patient Information 2014 [place], [place].

## 2013-03-12 LAB — HM MAMMOGRAPHY

## 2013-03-13 ENCOUNTER — Encounter: Payer: Self-pay | Admitting: Internal Medicine

## 2013-03-15 ENCOUNTER — Ambulatory Visit
Admission: RE | Admit: 2013-03-15 | Discharge: 2013-03-15 | Disposition: A | Discharge status: Home / Self Care | Payer: Medicare Other | Source: Ambulatory Visit | Attending: Internal Medicine | Admitting: Internal Medicine

## 2013-03-15 DIAGNOSIS — E042 Nontoxic multinodular goiter: Secondary | ICD-10-CM

## 2013-03-18 ENCOUNTER — Encounter: Payer: Self-pay | Admitting: Internal Medicine

## 2013-03-30 ENCOUNTER — Encounter: Payer: Self-pay | Admitting: Internal Medicine

## 2013-11-05 ENCOUNTER — Telehealth: Payer: Self-pay | Admitting: Internal Medicine

## 2013-11-05 NOTE — Telephone Encounter (Signed)
Sent pt's insurance info to [employer] for verification. As soon as I have a response, I will notify you. Thank you.

## 2013-11-05 NOTE — Telephone Encounter (Signed)
Patient is calling to have her next Prolia Injection. Please verify if patient is covered.

## 2013-11-05 NOTE — Telephone Encounter (Signed)
Opened in error

## 2013-11-13 NOTE — Telephone Encounter (Signed)
I have received patient's insurance verification for Prolia and according to the verification, patient's insurance that we have in the system terminated effective 05/29/2014.  Could you please advise if patient has a new insurance and if so send me the info for it? Thank you.

## 2013-11-15 NOTE — Telephone Encounter (Signed)
Yes. Patient now has [employer]. Sorry about that. I have updated insurance information in the patient's chart.

## 2013-11-18 NOTE — Telephone Encounter (Signed)
No problem. I have re-sent pt's info w/the updated insurance. I will let you know as soon as I have a response. Thank you.

## 2013-11-27 NOTE — Telephone Encounter (Signed)
I called today to check the status of pt's Prolia injection. Dee at [facility] says they did not have a request on file w/the updated insurance info (I have faxed this info twice).  I gave the updated info to her and asked her to please see verification was sent today.  She took updated info and assures me verification will be sent today. I will notify you as soon as I have a response. Thank you.

## 2013-12-09 NOTE — Telephone Encounter (Signed)
[facility] a prior authorization for Prolia. I have requested the prior authorization form and will get it to you so Dr. Felicity CoyerLeschber can sign as soon as I receive it. Can you give me a fax # to send it to, please? Also, once completed you can fax it and the clinicals back to me at [phone]. Thank you.

## 2013-12-09 NOTE — Telephone Encounter (Signed)
Yes fax#: [phone]

## 2013-12-31 NOTE — Telephone Encounter (Signed)
I had to call again today for the prior auth form from [facility].  I have rec'd it and faxed it to your attn.  Please have Dr. Felicity CoyerLeschber complete section G and sign the form.  Then, fax the completed form along with clinicals back to me at [phone].  Thank you.

## 2014-01-01 ENCOUNTER — Telehealth: Payer: Self-pay

## 2014-01-01 ENCOUNTER — Encounter: Payer: Self-pay | Admitting: Internal Medicine

## 2014-01-01 NOTE — Telephone Encounter (Signed)
Prolia precertificaiton form from [employer].   To be sent back to Jasmine Esparza.

## 2014-01-01 NOTE — Telephone Encounter (Signed)
Thank you. Form received and forwarded to Dr. Felicity CoyerLeschber.

## 2014-01-07 NOTE — Telephone Encounter (Signed)
I have rec'd the completed p/a form, but did not receive the clinicals and I need to send clinicals along w/the p/a form. Can you please get the clinicals to me? Thank you.

## 2014-01-07 NOTE — Telephone Encounter (Signed)
I apologize. I was not aware that they had sent PA form to you already.  Just faxed the clinicals over to you.  Thanks!

## 2014-01-07 NOTE — Telephone Encounter (Signed)
No apologies necessary. I have faxed the completed prior authorization form and clinicals and will notify you as soon as I have an approval #. Thank you.

## 2014-01-16 NOTE — Telephone Encounter (Signed)
I sent the prior auth & clinicals to [facility] on 01/07/2014. They re-verified insurance instead of sending [employer] the p/a. Colleen [facility] the [facility] while I was on the phone w/her. I'll continue to follow up. Thank you.

## 2014-01-20 NOTE — Telephone Encounter (Signed)
I rec'd a call from Clifton at [facility] in ref to Ms. Pommier's Prolia injection.  She says she is submitting it to the physicians for review and they should be calling us back sometime next week (reference #[id number]). Thank you.

## 2014-01-22 ENCOUNTER — Telehealth: Payer: Self-pay

## 2014-01-22 NOTE — Telephone Encounter (Signed)
Thank you for notifying me of [employer]'s approval for Prolia. Could you please include their reference # in this phone note so we have a record in her chart? Also, will you send a copy of the authorization to be scanned into Ms. Pipe's chart? Thank you.

## 2014-01-22 NOTE — Telephone Encounter (Signed)
PA for prolia is approved.

## 2014-01-27 NOTE — Telephone Encounter (Signed)
Jasmine Esparza. W/[employer] just called and gave me the authorization # for pt's Prolia injection. The authorization # is [id number] and Prolia is approved w/this auth # from 01/16/2014 to 08/20/20156. Pt's estimated responsibility is $40 copay PLUS 20% ($180) of injection, which makes her estimated responsibility approximately $220. If you have further questions, please let me know. Thank you.

## 2014-02-05 ENCOUNTER — Ambulatory Visit (INDEPENDENT_AMBULATORY_CARE_PROVIDER_SITE_OTHER): Payer: Medicare HMO

## 2014-02-05 DIAGNOSIS — M810 Age-related osteoporosis without current pathological fracture: Secondary | ICD-10-CM

## 2014-02-05 MED ORDER — DENOSUMAB 60 MG/ML ~~LOC~~ SOLN
60.0000 mg | Freq: Once | SUBCUTANEOUS | Status: AC
  Administered 2014-02-05: 60 mg via SUBCUTANEOUS

## 2014-02-17 ENCOUNTER — Telehealth: Payer: Self-pay | Admitting: Internal Medicine

## 2014-02-17 DIAGNOSIS — H9190 Unspecified hearing loss, unspecified ear: Secondary | ICD-10-CM

## 2014-02-17 NOTE — Telephone Encounter (Signed)
Patient has an appointment on 02/25/14 with   [facility] for a hearing check.  She is requesting a referral to be sent over.  Patient has [employer]

## 2014-02-18 NOTE — Telephone Encounter (Signed)
Order done

## 2014-02-18 NOTE — Telephone Encounter (Signed)
Pt need referral put in for [facility].  Thanks

## 2014-02-24 ENCOUNTER — Telehealth: Payer: Self-pay

## 2014-02-24 NOTE — Telephone Encounter (Signed)
Please let pt know we have ever filled this rx for pt Will need OV to review and rx same thanks

## 2014-02-25 ENCOUNTER — Other Ambulatory Visit: Payer: Self-pay

## 2014-02-25 MED ORDER — ALPRAZOLAM 0.5 MG PO TBDP: 0.5000 mg | ORAL_TABLET | Freq: Two times a day (BID) | ORAL | Status: DC | PRN

## 2014-02-27 ENCOUNTER — Ambulatory Visit (INDEPENDENT_AMBULATORY_CARE_PROVIDER_SITE_OTHER): Payer: Medicare HMO | Admitting: Internal Medicine

## 2014-02-27 ENCOUNTER — Encounter: Payer: Self-pay | Admitting: Internal Medicine

## 2014-02-27 ENCOUNTER — Other Ambulatory Visit (INDEPENDENT_AMBULATORY_CARE_PROVIDER_SITE_OTHER): Payer: Medicare HMO

## 2014-02-27 VITALS — BP 108/70 | HR 64 | Temp 98.4°F | Resp 14 | Wt 111.0 lb

## 2014-02-27 DIAGNOSIS — M810 Age-related osteoporosis without current pathological fracture: Secondary | ICD-10-CM

## 2014-02-27 DIAGNOSIS — E785 Hyperlipidemia, unspecified: Secondary | ICD-10-CM

## 2014-02-27 DIAGNOSIS — Z23 Encounter for immunization: Secondary | ICD-10-CM

## 2014-02-27 LAB — VITAMIN D 25 HYDROXY (VIT D DEFICIENCY, FRACTURES): VITD: 46.27 ng/mL (ref 30.00–100.00)

## 2014-02-27 MED ORDER — ALPRAZOLAM 0.5 MG PO TBDP
0.5000 mg | ORAL_TABLET | Freq: Two times a day (BID) | ORAL | Status: DC | PRN
Start: 2014-02-27 — End: 2014-05-20

## 2014-02-27 NOTE — Progress Notes (Signed)
Pre visit review using our clinic review tool, if applicable. No additional management support is needed unless otherwise documented below in the visit note. 

## 2014-02-27 NOTE — Progress Notes (Signed)
   Subjective:    Patient ID: Jasmine DerbyLinda V Esparza, female    DOB: 09/23/1940, 73 y.o.   MRN: [id number]  HPI   She is here for refill of medications. Specifically she is requesting Xanax. [facility]. The pain associated with the treatments  is "excruciating"; she takes Xanax 30 minutes prior to the appointment. She's only had 4 pills since her last prescription 2013.  She's also on Prolia for osteoporosis. Her bone density is up-to-date and not due again until October 2016.  She does have mild dyslipidemia with minimally elevated LDL but protective HDL. Her father did have carotid endarterectomies. There is no family history of premature heart attack.  She is physically active walking daily and performing ballroom dancing on the weekend without symptoms.  Her most recent comprehensive labs 03/11/13 were reviewed.    Review of Systems   Chest pain, palpitations, tachycardia, exertional dyspnea, paroxysmal nocturnal dyspnea, claudication or edema are absent.         Objective:   Physical Exam   Pertinent or positive findings include: She appears dramatically much younger than her stated age. She has decreased range of motion of the cervical spine She has isolated PIP osteoarthritic changes.  General appearance :adequately nourished; in no distress. Eyes: No conjunctival inflammation or scleral icterus is present. Oral exam: Dental hygiene is good. Lips and gums are healthy appearing.There is no oropharyngeal erythema or exudate noted.  Heart:  Normal rate and regular rhythm. S1 and S2 normal without gallop, murmur, click, rub or other extra sounds   Lungs:Chest clear to auscultation; no wheezes, rhonchi,rales ,or rubs present.No increased work of breathing.  Abdomen: bowel sounds normal, soft and non-tender without masses, organomegaly or hernias noted.  No guarding or rebound.  Vascular : all pulses equal ; no bruits  present. Skin:Warm & dry.  Intact without suspicious lesions or rashes ; no jaundice or tenting Lymphatic: No lymphadenopathy is noted about the head, neck, axilla Strength, tone ,ROM , & DTRs WNL.             Assessment & Plan:  #1 infrequent use of Xanax to treat anxiety related to painful invasive procedures  #2 osteoporosis; no vitamin D level on record.  #3 dyslipidemia; no significant risk suggested as HDL is protective.  See orders recommendations

## 2014-02-27 NOTE — Assessment & Plan Note (Signed)
Vitamin D level 

## 2014-02-27 NOTE — Patient Instructions (Signed)
Your next office appointment will be determined based upon review of your pending labs . Those instructions will be transmitted to you through My Chart  OR  by mail;whichever process is your choice to receive results & recommendations . Recommended lifestyle interventions for Osteoporosis include calcium 600 mg  daily  & vitamin D3 supplementation to keep vit D  level @ least 40-60. The usual vitamin D3 dose is 1000 IU daily; but individual dose is determined by annual vitamin D level monitor. Also weight bearing exercise such as  walking 30-45 minutes 3-4  X per week is recommended.

## 2014-03-04 ENCOUNTER — Ambulatory Visit: Payer: Medicare HMO

## 2014-03-13 ENCOUNTER — Encounter: Payer: Self-pay | Admitting: Internal Medicine

## 2014-05-20 ENCOUNTER — Ambulatory Visit (INDEPENDENT_AMBULATORY_CARE_PROVIDER_SITE_OTHER): Payer: Medicare HMO | Admitting: Internal Medicine

## 2014-05-20 ENCOUNTER — Encounter: Payer: Self-pay | Admitting: Internal Medicine

## 2014-05-20 ENCOUNTER — Other Ambulatory Visit (INDEPENDENT_AMBULATORY_CARE_PROVIDER_SITE_OTHER): Payer: Medicare HMO

## 2014-05-20 VITALS — BP 130/88 | HR 67 | Temp 98.4°F | Ht 61.5 in | Wt 112.0 lb

## 2014-05-20 DIAGNOSIS — Z0000 Encounter for general adult medical examination without abnormal findings: Secondary | ICD-10-CM

## 2014-05-20 DIAGNOSIS — E785 Hyperlipidemia, unspecified: Secondary | ICD-10-CM

## 2014-05-20 DIAGNOSIS — M810 Age-related osteoporosis without current pathological fracture: Secondary | ICD-10-CM

## 2014-05-20 LAB — CBC WITH DIFFERENTIAL/PLATELET
Basophils Absolute: 0 10*3/uL (ref 0.0–0.1)
Basophils Relative: 0.3 % (ref 0.0–3.0)
EOS PCT: 1.9 % (ref 0.0–5.0)
Eosinophils Absolute: 0.1 10*3/uL (ref 0.0–0.7)
HCT: 44.1 % (ref 36.0–46.0)
Hemoglobin: 14.3 g/dL (ref 12.0–15.0)
Lymphocytes Relative: 22.4 % (ref 12.0–46.0)
Lymphs Abs: 1.6 10*3/uL (ref 0.7–4.0)
MCHC: 32.4 g/dL (ref 30.0–36.0)
MCV: 93.3 fl (ref 78.0–100.0)
Monocytes Absolute: 0.8 10*3/uL (ref 0.1–1.0)
Monocytes Relative: 11.5 % (ref 3.0–12.0)
Neutro Abs: 4.5 10*3/uL (ref 1.4–7.7)
Neutrophils Relative %: 63.9 % (ref 43.0–77.0)
Platelets: 276 10*3/uL (ref 150.0–400.0)
RBC: 4.73 Mil/uL (ref 3.87–5.11)
RDW: 13 % (ref 11.5–15.5)
WBC: 7 10*3/uL (ref 4.0–10.5)

## 2014-05-20 LAB — LIPID PANEL
CHOL/HDL RATIO: 3
Cholesterol: 210 mg/dL — ABNORMAL HIGH (ref 0–200)
HDL: 62 mg/dL (ref >39.00)
LDL Cholesterol: 124 mg/dL — ABNORMAL HIGH (ref 0–99)
NONHDL: 148
Triglycerides: 119 mg/dL (ref 0.0–149.0)
VLDL: 23.8 mg/dL (ref 0.0–40.0)

## 2014-05-20 LAB — HEPATIC FUNCTION PANEL
ALT: 21 U/L (ref 0–35)
AST: 24 U/L (ref 0–37)
Albumin: 4.4 g/dL (ref 3.5–5.2)
Alkaline Phosphatase: 46 U/L (ref 39–117)
BILIRUBIN TOTAL: 0.6 mg/dL (ref 0.2–1.2)
Bilirubin, Direct: 0.1 mg/dL (ref 0.0–0.3)
TOTAL PROTEIN: 6.7 g/dL (ref 6.0–8.3)

## 2014-05-20 LAB — URINALYSIS, ROUTINE W REFLEX MICROSCOPIC
BILIRUBIN URINE: NEGATIVE
HGB URINE DIPSTICK: NEGATIVE
KETONES UR: NEGATIVE
LEUKOCYTES UA: NEGATIVE
Nitrite: NEGATIVE
Specific Gravity, Urine: 1.015 (ref 1.000–1.030)
Total Protein, Urine: NEGATIVE
UROBILINOGEN UA: 0.2 (ref 0.0–1.0)
Urine Glucose: NEGATIVE
pH: 7.5 (ref 5.0–8.0)

## 2014-05-20 LAB — BASIC METABOLIC PANEL
BUN: 15 mg/dL (ref 6–23)
CO2: 28 mEq/L (ref 19–32)
Calcium: 9.5 mg/dL (ref 8.4–10.5)
Chloride: 103 mEq/L (ref 96–112)
Creatinine, Ser: 0.8 mg/dL (ref 0.4–1.2)
GFR: 79.53 mL/min (ref >60.00)
GLUCOSE: 90 mg/dL (ref 70–99)
POTASSIUM: 4.6 meq/L (ref 3.5–5.1)
Sodium: 139 mEq/L (ref 135–145)

## 2014-05-20 LAB — TSH: TSH: 1.61 u[IU]/mL (ref 0.35–4.50)

## 2014-05-20 NOTE — Assessment & Plan Note (Signed)
Prior treatment with Actonel ineffective -  progressive loss on DEXA 02/2011: -3.1([facility]) -due for recheck 02/2013 without change Started Prolia 04/2011 - continue every 21mo Also Ca+D

## 2014-05-20 NOTE — Patient Instructions (Addendum)
It was good to see you today.  We have reviewed your prior records including labs and tests today  Health Maintenance reviewed - wil do Prevnar next visit! - all other recommended immunizations and age-appropriate screenings are up-to-date.  Test(s) ordered today. Your results will be released to Jasmine Esparza (or called to you) after review, usually within 72hours after test completion. If any changes need to be made, you will be notified at that same time.  Medications reviewed and updated, no changes recommended at this time.  Next Prolia injection is due in March 2016 -   Please schedule followup in 12 months for annual exam and labs, call sooner if problems.  Health Maintenance Adopting a healthy lifestyle and getting preventive care can go a long way to promote health and wellness. Talk with your health care provider about what schedule of regular examinations is right for you. This is a good chance for you to check in with your provider about disease prevention and staying healthy. In between checkups, there are plenty of things you can do on your own. Experts have done a lot of research about which lifestyle changes and preventive measures are most likely to keep you healthy. Ask your health care provider for more information. WEIGHT AND DIET  Eat a healthy diet  Be sure to include plenty of vegetables, fruits, low-fat dairy products, and lean protein.  Do not eat a lot of foods high in solid fats, added sugars, or salt.  Get regular exercise. This is one of the most important things you can do for your health.  Most adults should exercise for at least 150 minutes each week. The exercise should increase your heart rate and make you sweat (moderate-intensity exercise).  Most adults should also do strengthening exercises at least twice a week. This is in addition to the moderate-intensity exercise.  Maintain a healthy weight  Body mass index (BMI) is a measurement that can be used to  identify possible weight problems. It estimates body fat based on height and weight. Your health care provider can help determine your BMI and help you achieve or maintain a healthy weight.  For females 37 years of age and older:   A BMI below 18.5 is considered underweight.  A BMI of 18.5 to 24.9 is normal.  A BMI of 25 to 29.9 is considered overweight.  A BMI of 30 and above is considered obese.  Watch levels of cholesterol and blood lipids  You should start having your blood tested for lipids and cholesterol at 71 years of age, then have this test every 5 years.  You may need to have your cholesterol levels checked more often if:  Your lipid or cholesterol levels are high.  You are older than 71 years of age.  You are at high risk for heart disease.  CANCER SCREENING   Lung Cancer  Lung cancer screening is recommended for adults 26-3 years old who are at high risk for lung cancer because of a history of smoking.  A yearly low-dose CT scan of the lungs is recommended for people who:  Currently smoke.  Have quit within the past 15 years.  Have at least a 30-pack-year history of smoking. A pack year is smoking an average of one pack of cigarettes a day for 1 year.  Yearly screening should continue until it has been 15 years since you quit.  Yearly screening should stop if you develop a health problem that would prevent you from having lung  cancer treatment.  Breast Cancer  Practice breast self-awareness. This means understanding how your breasts normally appear and feel.  It also means doing regular breast self-exams. Let your health care provider know about any changes, no matter how small.  If you are in your 20s or 30s, you should have a clinical breast exam (CBE) by a health care provider every 1-3 years as part of a regular health exam.  If you are 33 or older, have a CBE every year. Also consider having a breast X-ray (mammogram) every year.  If you have a  family history of breast cancer, talk to your health care provider about genetic screening.  If you are at high risk for breast cancer, talk to your health care provider about having an MRI and a mammogram every year.  Breast cancer gene (BRCA) assessment is recommended for women who have family members with BRCA-related cancers. BRCA-related cancers include:  Breast.  Ovarian.  Tubal.  Peritoneal cancers.  Results of the assessment will determine the need for genetic counseling and BRCA1 and BRCA2 testing. Cervical Cancer Routine pelvic examinations to screen for cervical cancer are no longer recommended for nonpregnant women who are considered low risk for cancer of the pelvic organs (ovaries, uterus, and vagina) and who do not have symptoms. A pelvic examination may be necessary if you have symptoms including those associated with pelvic infections. Ask your health care provider if a screening pelvic exam is right for you.   The Pap test is the screening test for cervical cancer for women who are considered at risk.  If you had a hysterectomy for a problem that was not cancer or a condition that could lead to cancer, then you no longer need Pap tests.  If you are older than 65 years, and you have had normal Pap tests for the past 10 years, you no longer need to have Pap tests.  If you have had past treatment for cervical cancer or a condition that could lead to cancer, you need Pap tests and screening for cancer for at least 20 years after your treatment.  If you no longer get a Pap test, assess your risk factors if they change (such as having a new sexual partner). This can affect whether you should start being screened again.  Some women have medical problems that increase their chance of getting cervical cancer. If this is the case for you, your health care provider may recommend more frequent screening and Pap tests.  The human papillomavirus (HPV) test is another test that may  be used for cervical cancer screening. The HPV test looks for the virus that can cause cell changes in the cervix. The cells collected during the Pap test can be tested for HPV.  The HPV test can be used to screen women 67 years of age and older. Getting tested for HPV can extend the interval between normal Pap tests from three to five years.  An HPV test also should be used to screen women of any age who have unclear Pap test results.  After 71 years of age, women should have HPV testing as often as Pap tests.  Colorectal Cancer  This type of cancer can be detected and often prevented.  Routine colorectal cancer screening usually begins at 71 years of age and continues through 71 years of age.  Your health care provider may recommend screening at an earlier age if you have risk factors for colon cancer.  Your health care provider may  also recommend using home test kits to check for hidden blood in the stool.  A small camera at the end of a tube can be used to examine your colon directly (sigmoidoscopy or colonoscopy). This is done to check for the earliest forms of colorectal cancer.  Routine screening usually begins at age 60.  Direct examination of the colon should be repeated every 5-10 years through 71 years of age. However, you may need to be screened more often if early forms of precancerous polyps or small growths are found. Skin Cancer  Check your skin from head to toe regularly.  Tell your health care provider about any new moles or changes in moles, especially if there is a change in a mole's shape or color.  Also tell your health care provider if you have a mole that is larger than the size of a pencil eraser.  Always use sunscreen. Apply sunscreen liberally and repeatedly throughout the day.  Protect yourself by wearing long sleeves, pants, a wide-brimmed hat, and sunglasses whenever you are outside. HEART DISEASE, DIABETES, AND HIGH BLOOD PRESSURE   Have your blood  pressure checked at least every 1-2 years. High blood pressure causes heart disease and increases the risk of stroke.  If you are between 41 years and 63 years old, ask your health care provider if you should take aspirin to prevent strokes.  Have regular diabetes screenings. This involves taking a blood sample to check your fasting blood sugar level.  If you are at a normal weight and have a low risk for diabetes, have this test once every three years after 71 years of age.  If you are overweight and have a high risk for diabetes, consider being tested at a younger age or more often. PREVENTING INFECTION  Hepatitis B  If you have a higher risk for hepatitis B, you should be screened for this virus. You are considered at high risk for hepatitis B if:  You were born in a country where hepatitis B is common. Ask your health care provider which countries are considered high risk.  Your parents were born in a high-risk country, and you have not been immunized against hepatitis B (hepatitis B vaccine).  You have HIV or AIDS.  You use needles to inject street drugs.  You live with someone who has hepatitis B.  You have had sex with someone who has hepatitis B.  You get hemodialysis treatment.  You take certain medicines for conditions, including cancer, organ transplantation, and autoimmune conditions. Hepatitis C  Blood testing is recommended for:  Everyone born from [birth year] through 1963.  Anyone with known risk factors for hepatitis C. Sexually transmitted infections (STIs)  You should be screened for sexually transmitted infections (STIs) including gonorrhea and chlamydia if:  You are sexually active and are younger than 71 years of age.  You are older than 71 years of age and your health care provider tells you that you are at risk for this type of infection.  Your sexual activity has changed since you were last screened and you are at an increased risk for chlamydia or  gonorrhea. Ask your health care provider if you are at risk.  If you do not have HIV, but are at risk, it may be recommended that you take a prescription medicine daily to prevent HIV infection. This is called pre-exposure prophylaxis (PrEP). You are considered at risk if:  You are sexually active and do not regularly use condoms or know the HIV  status of your partner(s).  You take drugs by injection.  You are sexually active with a partner who has HIV. Talk with your health care provider about whether you are at high risk of being infected with HIV. If you choose to begin PrEP, you should first be tested for HIV. You should then be tested every 3 months for as long as you are taking PrEP.  PREGNANCY   If you are premenopausal and you may become pregnant, ask your health care provider about preconception counseling.  If you may become pregnant, take 400 to 800 micrograms (mcg) of folic acid every day.  If you want to prevent pregnancy, talk to your health care provider about birth control (contraception). OSTEOPOROSIS AND MENOPAUSE   Osteoporosis is a disease in which the bones lose minerals and strength with aging. This can result in serious bone fractures. Your risk for osteoporosis can be identified using a bone density scan.  If you are 70 years of age or older, or if you are at risk for osteoporosis and fractures, ask your health care provider if you should be screened.  Ask your health care provider whether you should take a calcium or vitamin D supplement to lower your risk for osteoporosis.  Menopause may have certain physical symptoms and risks.  Hormone replacement therapy may reduce some of these symptoms and risks. Talk to your health care provider about whether hormone replacement therapy is right for you.  HOME CARE INSTRUCTIONS   Schedule regular health, dental, and eye exams.  Stay current with your immunizations.   Do not use any tobacco products including  cigarettes, chewing tobacco, or electronic cigarettes.  If you are pregnant, do not drink alcohol.  If you are breastfeeding, limit how much and how often you drink alcohol.  Limit alcohol intake to no more than 1 drink per day for nonpregnant women. One drink equals 12 ounces of beer, 5 ounces of wine, or 1 ounces of hard liquor.  Do not use street drugs.  Do not share needles.  Ask your health care provider for help if you need support or information about quitting drugs.  Tell your health care provider if you often feel depressed.  Tell your health care provider if you have ever been abused or do not feel safe at home. Document Released: 11/29/2010 Document Revised: 09/30/2013 Document Reviewed: 04/17/2013 [facility] Patient Information 2015 [place], [place]. This information is not intended to replace advice given to you by your health care provider. Make sure you discuss any questions you have with your health care provider.

## 2014-05-20 NOTE — Progress Notes (Signed)
Pre visit review using our clinic review tool, if applicable. No additional management support is needed unless otherwise documented below in the visit note. 

## 2014-05-20 NOTE — Progress Notes (Signed)
Subjective:    Patient ID: Jasmine Esparza, female    DOB: [birth year], 73 y.o.   MRN: [id number]  HPI   Here for medicare wellness  Diet: heart healthy  Physical activity: sedentary Depression/mood screen: negative Hearing: intact to whispered voice Visual acuity: grossly normal, performs annual eye exam  ADLs: capable Fall risk: none Home safety: good Cognitive evaluation: intact to orientation, naming, recall and repetition EOL planning: adv directives, full code/ I agree  I have personally reviewed and have noted 1. The patient's medical and social history 2. Their use of alcohol, tobacco or illicit drugs 3. Their current medications and supplements 4. The patient's functional ability including ADL's, fall risks, home safety risks and hearing or visual impairment. 5. Diet and physical activities 6. Evidence for depression or mood disorders  Also reviewed chronic medical issues and interval medical events  Past Medical History  Diagnosis Date  . ALLERGIC RHINITIS   . ECZEMA   . GOITER, MULTINODULAR     s/p I-131 ablation 10/2009  . Hypercalcemia   . HYPERLIPIDEMIA   . OSTEOPOROSIS    Family History  Problem Relation Age of Onset  . Stroke Mother   . Arthritis Mother   . Hypertension Mother   . Prostate cancer Father   . Arthritis Father   . Hypertension Sister   . Osteoporosis Sister   . Hypertension Brother   . Hyperlipidemia Brother    History  Substance Use Topics  . Smoking status: Never Smoker   . Smokeless tobacco: Not on file     Comment: Married, lives with spouse. retired  . Alcohol Use: No   Review of Systems  Constitutional: Negative for fatigue and unexpected weight change.  Respiratory: Negative for cough, shortness of breath and wheezing.   Cardiovascular: Negative for chest pain, palpitations and leg swelling.  Gastrointestinal: Negative for nausea, abdominal pain and diarrhea.  Skin:       Thinning hair at crown  Neurological:  Negative for dizziness, weakness, light-headedness and headaches.  Psychiatric/Behavioral: Negative for dysphoric mood. The patient is not nervous/anxious.   All other systems reviewed and are negative.      Objective:   Physical Exam  BP 130/88 mmHg  Pulse 67  Temp(Src) 98.4 F (36.9 C) (Oral)  Ht 5' 1.5" (1.562 m)  Wt 112 lb (50.803 kg)  BMI 20.82 kg/m2  SpO2 99% Wt Readings from Last 3 Encounters:  05/20/14 112 lb (50.803 kg)  02/27/14 111 lb (50.349 kg)  03/11/13 116 lb 12.8 oz (52.98 kg)   Constitutional: She is thin, appears well-developed and well-nourished. No distress.  HENT: Head: Normocephalic and atraumatic. Ears: B TMs ok, no erythema or effusion; Nose: Nose normal. Mouth/Throat: Oropharynx is clear and moist. No oropharyngeal exudate.  Eyes: Conjunctivae and EOM are normal. Pupils are equal, round, and reactive to light. No scleral icterus.  Neck: Normal range of motion. Neck supple. No JVD present. No thyromegaly present.  Cardiovascular: Normal rate, regular rhythm and normal heart sounds.  No murmur heard. No BLE edema. Pulmonary/Chest: Effort normal and breath sounds normal. No respiratory distress. She has no wheezes.  Abdominal: Soft. Bowel sounds are normal. She exhibits no distension. There is no tenderness. no masses GU/breast: defer to gyn Musculoskeletal: Normal range of motion, no joint effusions. No gross deformities Neurological: She is alert and oriented to person, place, and time. No cranial nerve deficit. Coordination, balance, strength, speech and gait are normal.  Skin: Skin is warm and dry. No  rash noted. No erythema.  Psychiatric: She has a normal mood and affect. Her behavior is normal. Judgment and thought content normal.    Lab Results  Component Value Date   WBC 6.6 03/11/2013   HGB 13.9 03/11/2013   HCT 41.2 03/11/2013   PLT 277.0 03/11/2013   GLUCOSE 98 03/11/2013   CHOL 204* 03/11/2013   TRIG 62.0 03/11/2013   HDL 69.10 03/11/2013    LDLDIRECT 114.9 03/11/2013   ALT 19 03/05/2012   AST 22 03/05/2012   NA 140 03/11/2013   K 4.3 03/11/2013   CL 102 03/11/2013   CREATININE 0.8 03/11/2013   BUN 18 03/11/2013   CO2 30 03/11/2013   TSH 1.03 03/11/2013    [place] Soft Tissue Head/neck  03/15/2013   CLINICAL DATA:  Goiter.  EXAM: THYROID ULTRASOUND  TECHNIQUE: Ultrasound examination of the thyroid gland and adjacent soft tissues was performed.  COMPARISON:  03/12/2012  FINDINGS: Right thyroid lobe  Measurements: 4.5 x 1.6 x 1.0 cm. Multiple small nodules. The dominant nodules on the right are in the lower pole, one measuring 1.2 x 0.6 x 0.9 cm and the other measuring 1.2 x 0.8 x 0.8 cm.  Left thyroid lobe  Measurements: 4.6 x 2.3 x 1.2 cm. Dominant 2.4 x 1.7 x 1.6 cm solid heterogeneous nodule in the left lower pole.  Isthmus  Thickness: 0.2 cm.  No nodules visualized.  Lymphadenopathy  None visualized.  IMPRESSION: Multinodular goiter, essentially unchanged since the prior study. The dominant nodules are stable.   Electronically Signed   By: Geanie CooleyJim  Maxwell M.D.   On: 03/15/2013 14:55       Assessment & Plan:   AWV/CPX/z00.00- Today patient counseled on age appropriate routine health concerns for screening and prevention, each reviewed and up to date or declined. Immunizations reviewed and up to date or declined. Labs ordered and reviewed. Risk factors for depression reviewed and negative. Hearing function and visual acuity are intact. ADLs screened and addressed as needed. Functional ability and level of safety reviewed and appropriate. Education, counseling and referrals performed based on assessed risks today. Patient provided with a copy of personalized plan for preventive services.  Problem List Items Addressed This Visit    Osteoporosis - Primary    Prior treatment with Actonel ineffective -  progressive loss on DEXA 02/2011: -3.1([facility]) -due for recheck 02/2013 without change Started Prolia 04/2011 - continue every 66mo Also  Ca+D    Relevant Orders      Basic metabolic panel      CBC with Differential      Hepatic function panel      Lipid panel      Urinalysis, Routine w reflex microscopic      TSH    Other Visit Diagnoses    Routine general medical examination at a health care facility

## 2014-08-14 ENCOUNTER — Encounter: Payer: Medicare HMO | Admitting: Internal Medicine

## 2014-08-18 ENCOUNTER — Telehealth: Payer: Self-pay | Admitting: Internal Medicine

## 2014-08-18 NOTE — Telephone Encounter (Signed)
Pt called in wanting to get her Prolia shot    Please call her when it is in

## 2014-08-18 NOTE — Telephone Encounter (Signed)
Must be pre authorized first, then schedule at least a week out, then I can order. Thanks

## 2014-09-18 ENCOUNTER — Ambulatory Visit (INDEPENDENT_AMBULATORY_CARE_PROVIDER_SITE_OTHER): Payer: Medicare HMO | Admitting: Internal Medicine

## 2014-09-18 ENCOUNTER — Encounter: Payer: Self-pay | Admitting: Internal Medicine

## 2014-09-18 VITALS — BP 152/90 | HR 76 | Temp 99.0°F | Wt 113.0 lb

## 2014-09-18 DIAGNOSIS — M810 Age-related osteoporosis without current pathological fracture: Secondary | ICD-10-CM | POA: Diagnosis not present

## 2014-09-18 NOTE — Patient Instructions (Signed)
We will see you back for your physical in the fall. If you have any problems or questions before then please feel free to call the office.   We are getting the prolia approved and sent to us. If you do not hear back within 2 weeks please call the office for an update.   Keep up the good work with your health!

## 2014-09-18 NOTE — Progress Notes (Signed)
   Subjective:    Patient ID: Jasmine Esparza, female    DOB: [birth year], 74 y.o.   MRN: [id number]  HPI The patient is a 74 YO female who is coming in to follow up on her osteoporosis. She has not gotten her prolia yet and called about 1 month ago. She did try actonel which did not help first. Denies any adverse events with the prolia. She is doing weight bearing activity at least 3 times a week (ballroom dancing as well as exercise classes). No other complaints.   Review of Systems  Constitutional: Negative for fever, activity change, appetite change and fatigue.  Respiratory: Negative for cough, chest tightness, shortness of breath and wheezing.   Cardiovascular: Negative for chest pain, palpitations and leg swelling.  Gastrointestinal: Negative.   Musculoskeletal: Negative.   Neurological: Negative.   Psychiatric/Behavioral: Negative.      Objective:   Physical Exam  Constitutional: She is oriented to person, place, and time. She appears well-developed and well-nourished.  HENT:  Head: Normocephalic and atraumatic.  Eyes: EOM are normal.  Neck: Normal range of motion.  Cardiovascular: Normal rate and regular rhythm.   No murmur heard. Pulmonary/Chest: Effort normal. No respiratory distress. She has no wheezes.  Abdominal: Soft. She exhibits no distension. There is no tenderness.  Neurological: She is alert and oriented to person, place, and time. Coordination normal.  Skin: Skin is warm and dry.   Filed Vitals:   09/18/14 1317  BP: 152/90  Pulse: 76  Temp: 99 F (37.2 C)  Weight: 113 lb (51.256 kg)  SpO2: 98%      Assessment & Plan:

## 2014-09-18 NOTE — Progress Notes (Signed)
Patient received education resource, including the self-management goal and tool. Patient verbalized understanding. 

## 2014-09-18 NOTE — Assessment & Plan Note (Signed)
Started treatment in 2012 late so will complete 5 year in 2017 late and can discuss drug holiday. Due for prolia and will make sure that this is run through insurance and ordered and given.

## 2014-09-29 ENCOUNTER — Telehealth: Payer: Self-pay | Admitting: Internal Medicine

## 2014-09-29 NOTE — Telephone Encounter (Signed)
I have electronically sent pt's info for [employer] insurance verification and will notify you when I have a response.  Thank you.

## 2014-10-08 NOTE — Telephone Encounter (Signed)
Patient calling to check on status of prolia approval. Has there been an update at this point?

## 2014-10-13 NOTE — Telephone Encounter (Signed)
I have rec'd pt's insurance verification for Prolia.  Monia Pouchetna is requiring a prior authorization, however, there is still an authorization in place from August of last year that was verbally called in by Bonita QuinLinda C w/[employer]. The authorization # is [id number] and Prolia is approved w/this auth # from 01/16/2014 to 01/17/2015. Whether an OV is billed or not pt will have an estimated responsibility of a $10 co-pay plus a 20% co-insurance, which is approximately $180 for a total estimated responsibility of $190.  Please make pt aware this is only an estimate and we will not know an exact amt until her insurance has paid. I have sent a copy of the summary of benefits to be scanned into her chart.  If pt cannot afford $190 for her injection, please advise her to contact Prolia at [phone] to see if she qualifies for one of their assistance programs.  If she qualifies they will instruct her how to proceed.  Please let me know if you have any questions. Thank you.

## 2014-10-13 NOTE — Telephone Encounter (Signed)
Spoke to patient. Scheduled and explained. Thanks Rose.

## 2014-10-14 ENCOUNTER — Ambulatory Visit (INDEPENDENT_AMBULATORY_CARE_PROVIDER_SITE_OTHER): Payer: Medicare HMO

## 2014-10-14 DIAGNOSIS — M810 Age-related osteoporosis without current pathological fracture: Secondary | ICD-10-CM | POA: Diagnosis not present

## 2014-10-14 MED ORDER — DENOSUMAB 60 MG/ML ~~LOC~~ SOLN
60.0000 mg | Freq: Once | SUBCUTANEOUS | Status: AC
  Administered 2014-10-14: 60 mg via SUBCUTANEOUS

## 2014-11-11 ENCOUNTER — Telehealth: Payer: Self-pay

## 2014-11-11 NOTE — Telephone Encounter (Signed)
Patient called to educate on Medicare Wellness apt. LVM for the patient to call back to educate and schedule for wellness visit.   

## 2014-11-18 ENCOUNTER — Telehealth: Payer: Self-pay

## 2014-11-18 NOTE — Telephone Encounter (Signed)
Call to patient and educated AWV; Educated regarding AWV and CPE and that both are generally covered unless during her annual CPE she has other new issues or problems that the doctor needs to address and if so; there may be copay for this visit; Referred back to carrier for benefits.  Apt made for 10/20 at 10:15am.

## 2015-03-02 ENCOUNTER — Telehealth: Payer: Self-pay | Admitting: Internal Medicine

## 2015-03-02 DIAGNOSIS — M810 Age-related osteoporosis without current pathological fracture: Secondary | ICD-10-CM

## 2015-03-02 NOTE — Telephone Encounter (Signed)
Last dexa was 03/12/2013. Patient is requesting a referral to [facility] for dexa. fex # [id number]

## 2015-03-03 ENCOUNTER — Ambulatory Visit (INDEPENDENT_AMBULATORY_CARE_PROVIDER_SITE_OTHER): Payer: Medicare HMO

## 2015-03-03 DIAGNOSIS — Z23 Encounter for immunization: Secondary | ICD-10-CM | POA: Diagnosis not present

## 2015-03-03 NOTE — Telephone Encounter (Signed)
Patient aware and will call and schedule her DEXA

## 2015-03-03 NOTE — Telephone Encounter (Signed)
Order placed so she can call [facility] and make the appointment.

## 2015-03-17 DIAGNOSIS — Z780 Asymptomatic menopausal state: Secondary | ICD-10-CM | POA: Diagnosis not present

## 2015-03-17 DIAGNOSIS — M810 Age-related osteoporosis without current pathological fracture: Secondary | ICD-10-CM | POA: Diagnosis not present

## 2015-03-19 ENCOUNTER — Ambulatory Visit (INDEPENDENT_AMBULATORY_CARE_PROVIDER_SITE_OTHER): Payer: Medicare HMO

## 2015-03-19 VITALS — BP 138/80 | Ht 62.0 in | Wt 117.0 lb

## 2015-03-19 DIAGNOSIS — Z0000 Encounter for general adult medical examination without abnormal findings: Secondary | ICD-10-CM

## 2015-03-19 DIAGNOSIS — Z23 Encounter for immunization: Secondary | ICD-10-CM

## 2015-03-19 NOTE — Progress Notes (Signed)
Medical screening examination/treatment/procedure(s) were performed by non-physician practitioner and as supervising physician I was immediately available for consultation/collaboration. I agree with above. Devony Mcgrady A Natassja Ollis, MD 

## 2015-03-19 NOTE — Patient Instructions (Addendum)
[name] , Thank you for taking time to come for your Medicare Wellness Visit. I appreciate your ongoing commitment to your health goals. Please review the following plan we discussed and let me know if I can assist you in the future.   Needs Prevnar today  Needs mammagram or will continue to do self breast exam   Will consider shingles vaccination; Important Safety Information ZOSTAVAX does not protect everyone, so some people who get the vaccine may still get Shingles.  You should not get ZOSTAVAX if you are allergic to any of its ingredients, including gelatin or neomycin, have a weakened immune system, take high doses of steroids, or are pregnant or plan to become pregnant. You should not get ZOSTAVAX to prevent chickenpox.  Talk to your health care professional if you plan to get ZOSTAVAX at the same time as PNEUMOVAX23 (Pneumococcal Vaccine Polyvalent) because it may be better to get these vaccines at least 4 weeks apart.  Possible side effects include redness, pain, itching, swelling, hard lump, warmth, or bruising at the injection site, as well as headache.  ZOSTAVAX contains a weakened chickenpox virus. Tell your health care professional if you will be in close contact with newborn infants, someone who may be pregnant and has not had chickenpox or been vaccinated against chickenpox, or someone who has problems with their immune system. Your health care professional can tell you what situations you may need to avoid. You are encouraged to report negative side effects of prescription drugs to the FDA. Visit [employer], or call 1-800-FDA-1088.  Will check with insurance for coverage;     Will bring in copy of LW; or health care power of attorney  These are the goals we discussed: Goals    . pateint goal     Wants to maintain health and keep dancing        This is a list of the screening recommended for you and due dates:  Health Maintenance  Topic Date Due  .  Shingles Vaccine  06/26/2002  . Pneumonia vaccines (2 of 2 - PCV13) 03/11/2014  . Mammogram  03/13/2015  . Tetanus Vaccine  09/09/2015  . Flu Shot  12/29/2015  . Colon Cancer Screening  03/11/2018  . DEXA scan (bone density measurement)  Completed     Fat and Cholesterol Restricted Diet High levels of fat and cholesterol in your blood may lead to various health problems, such as diseases of the heart, blood vessels, gallbladder, liver, and pancreas. Fats are concentrated sources of energy that come in various forms. Certain types of fat, including saturated fat, may be harmful in excess. Cholesterol is a substance needed by your body in small amounts. Your body makes all the cholesterol it needs. Excess cholesterol comes from the food you eat. When you have high levels of cholesterol and saturated fat in your blood, health problems can develop because the excess fat and cholesterol will gather along the walls of your blood vessels, causing them to narrow. Choosing the right foods will help you control your intake of fat and cholesterol. This will help keep the levels of these substances in your blood within normal limits and reduce your risk of disease. WHAT IS MY PLAN? Your health care provider recommends that you:  Get no more than __________ % of the total calories in your daily diet from fat.  Limit your intake of saturated fat to less than ______% of your total calories each day.  Limit the amount of cholesterol in  your diet to less than _________mg per day. WHAT TYPES OF FAT SHOULD I CHOOSE?  Choose healthy fats more often. Choose monounsaturated and polyunsaturated fats, such as olive and canola oil, flaxseeds, walnuts, almonds, and seeds.  Eat more omega-3 fats. Good choices include salmon, mackerel, sardines, tuna, flaxseed oil, and ground flaxseeds. Aim to eat fish at least two times a week.  Limit saturated fats. Saturated fats are primarily found in animal products, such as  meats, butter, and cream. Plant sources of saturated fats include palm oil, palm kernel oil, and coconut oil.  Avoid foods with partially hydrogenated oils in them. These contain trans fats. Examples of foods that contain trans fats are stick margarine, some tub margarines, cookies, crackers, and other baked goods. WHAT GENERAL GUIDELINES DO I NEED TO FOLLOW? These guidelines for healthy eating will help you control your intake of fat and cholesterol:  Check food labels carefully to identify foods with trans fats or high amounts of saturated fat.  Fill one half of your plate with vegetables and green salads.  Fill one fourth of your plate with whole grains. Look for the word "whole" as the first word in the ingredient list.  Fill one fourth of your plate with lean protein foods.  Limit fruit to two servings a day. Choose fruit instead of juice.  Eat more foods that contain soluble fiber. Examples of foods that contain this type of fiber are apples, broccoli, carrots, beans, peas, and barley. Aim to get 20-30 g of fiber per day.  Eat more home-cooked food and less restaurant, buffet, and fast food.  Limit or avoid alcohol.  Limit foods high in starch and sugar.  Limit fried foods.  Cook foods using methods other than frying. Baking, boiling, grilling, and broiling are all great options.  Lose weight if you are overweight. Losing just 5-10% of your initial body weight can help your overall health and prevent diseases such as diabetes and heart disease. WHAT FOODS CAN I EAT? Grains Whole grains, such as whole wheat or whole grain breads, crackers, cereals, and pasta. Unsweetened oatmeal, bulgur, barley, quinoa, or brown rice. Corn or whole wheat flour tortillas. Vegetables Fresh or frozen vegetables (raw, steamed, roasted, or grilled). Green salads. Fruits All fresh, canned (in natural juice), or frozen fruits. Meat and Other Protein Products Ground beef (85% or leaner), grass-fed  beef, or beef trimmed of fat. Skinless chicken or [place]. Ground chicken or [place]. Pork trimmed of fat. All fish and seafood. Eggs. Dried beans, peas, or lentils. Unsalted nuts or seeds. Unsalted canned or dry beans. Dairy Low-fat dairy products, such as skim or 1% milk, 2% or reduced-fat cheeses, low-fat ricotta or cottage cheese, or plain low-fat yogurt. Fats and Oils Tub margarines without trans fats. Light or reduced-fat mayonnaise and salad dressings. Avocado. Olive, canola, sesame, or safflower oils. Natural peanut or almond butter (choose ones without added sugar and oil). The items listed above may not be a complete list of recommended foods or beverages. Contact your dietitian for more options. WHAT FOODS ARE NOT RECOMMENDED? Grains White bread. White pasta. White rice. Cornbread. Bagels, pastries, and croissants. Crackers that contain trans fat. Vegetables White potatoes. Corn. Creamed or fried vegetables. Vegetables in a cheese sauce. Fruits Dried fruits. Canned fruit in light or heavy syrup. Fruit juice. Meat and Other Protein Products Fatty cuts of meat. Ribs, chicken wings, bacon, sausage, bologna, salami, chitterlings, fatback, hot dogs, bratwurst, and packaged luncheon meats. Liver and organ meats. Dairy Whole or 2%  milk, cream, half-and-half, and cream cheese. Whole milk cheeses. Whole-fat or sweetened yogurt. Full-fat cheeses. Nondairy creamers and whipped toppings. Processed cheese, cheese spreads, or cheese curds. Sweets and Desserts Corn syrup, sugars, honey, and molasses. Candy. Jam and jelly. Syrup. Sweetened cereals. Cookies, pies, cakes, donuts, muffins, and ice cream. Fats and Oils Butter, stick margarine, lard, shortening, ghee, or bacon fat. Coconut, palm kernel, or palm oils. Beverages Alcohol. Sweetened drinks (such as sodas, lemonade, and fruit drinks or punches). The items listed above may not be a complete list of foods and beverages to avoid. Contact your  dietitian for more information.   This information is not intended to replace advice given to you by your health care provider. Make sure you discuss any questions you have with your health care provider.   Document Released: 05/16/2005 Document Revised: 06/06/2014 Document Reviewed: 08/14/2013 Elsevier Interactive Patient Education 2016 [place] in the Home  Falls can cause injuries. They can happen to people of all ages. There are many things you can do to make your home safe and to help prevent falls.  WHAT CAN I DO ON THE OUTSIDE OF MY HOME?  Regularly fix the edges of walkways and driveways and fix any cracks.  Remove anything that might make you trip as you walk through a door, such as a raised step or threshold.  Trim any bushes or trees on the path to your home.  Use bright outdoor lighting.  Clear any walking paths of anything that might make someone trip, such as rocks or tools.  Regularly check to see if handrails are loose or broken. Make sure that both sides of any steps have handrails.  Any raised decks and porches should have guardrails on the edges.  Have any leaves, snow, or ice cleared regularly.  Use sand or salt on walking paths during winter.  Clean up any spills in your garage right away. This includes oil or grease spills. WHAT CAN I DO IN THE BATHROOM?   Use night lights.  Install grab bars by the toilet and in the tub and shower. Do not use towel bars as grab bars.  Use non-skid mats or decals in the tub or shower.  If you need to sit down in the shower, use a plastic, non-slip stool.  Keep the floor dry. Clean up any water that spills on the floor as soon as it happens.  Remove soap buildup in the tub or shower regularly.  Attach bath mats securely with double-sided non-slip rug tape.  Do not have throw rugs and other things on the floor that can make you trip. WHAT CAN I DO IN THE BEDROOM?  Use night lights.  Make  sure that you have a light by your bed that is easy to reach.  Do not use any sheets or blankets that are too big for your bed. They should not hang down onto the floor.  Have a firm chair that has side arms. You can use this for support while you get dressed.  Do not have throw rugs and other things on the floor that can make you trip. WHAT CAN I DO IN THE KITCHEN?  Clean up any spills right away.  Avoid walking on wet floors.  Keep items that you use a lot in easy-to-reach places.  If you need to reach something above you, use a strong step stool that has a grab bar.  Keep electrical cords out of the way.  Do not  use floor polish or wax that makes floors slippery. If you must use wax, use non-skid floor wax.  Do not have throw rugs and other things on the floor that can make you trip. WHAT CAN I DO WITH MY STAIRS?  Do not leave any items on the stairs.  Make sure that there are handrails on both sides of the stairs and use them. Fix handrails that are broken or loose. Make sure that handrails are as long as the stairways.  Check any carpeting to make sure that it is firmly attached to the stairs. Fix any carpet that is loose or worn.  Avoid having throw rugs at the top or bottom of the stairs. If you do have throw rugs, attach them to the floor with carpet tape.  Make sure that you have a light switch at the top of the stairs and the bottom of the stairs. If you do not have them, ask someone to add them for you. WHAT ELSE CAN I DO TO HELP PREVENT FALLS?  Wear shoes that:  Do not have high heels.  Have rubber bottoms.  Are comfortable and fit you well.  Are closed at the toe. Do not wear sandals.  If you use a stepladder:  Make sure that it is fully opened. Do not climb a closed stepladder.  Make sure that both sides of the stepladder are locked into place.  Ask someone to hold it for you, if possible.  Clearly mark and make sure that you can see:  Any grab  bars or handrails.  First and last steps.  Where the edge of each step is.  Use tools that help you move around (mobility aids) if they are needed. These include:  Canes.  Walkers.  Scooters.  Crutches.  Turn on the lights when you go into a dark area. Replace any light bulbs as soon as they burn out.  Set up your furniture so you have a clear path. Avoid moving your furniture around.  If any of your floors are uneven, fix them.  If there are any pets around you, be aware of where they are.  Review your medicines with your doctor. Some medicines can make you feel dizzy. This can increase your chance of falling. Ask your doctor what other things that you can do to help prevent falls.   This information is not intended to replace advice given to you by your health care provider. Make sure you discuss any questions you have with your health care provider.   Document Released: 03/12/2009 Document Revised: 09/30/2014 Document Reviewed: 06/20/2014 Elsevier Interactive Patient Education 2016 [place] Maintenance, Female Adopting a healthy lifestyle and getting preventive care can go a long way to promote health and wellness. Talk with your health care provider about what schedule of regular examinations is right for you. This is a good chance for you to check in with your provider about disease prevention and staying healthy. In between checkups, there are plenty of things you can do on your own. Experts have done a lot of research about which lifestyle changes and preventive measures are most likely to keep you healthy. Ask your health care provider for more information. WEIGHT AND DIET  Eat a healthy diet  Be sure to include plenty of vegetables, fruits, low-fat dairy products, and lean protein.  Do not eat a lot of foods high in solid fats, added sugars, or salt.  Get regular exercise. This is one of the most  important things you can do for your health.  Most  adults should exercise for at least 150 minutes each week. The exercise should increase your heart rate and make you sweat (moderate-intensity exercise).  Most adults should also do strengthening exercises at least twice a week. This is in addition to the moderate-intensity exercise.  Maintain a healthy weight  Body mass index (BMI) is a measurement that can be used to identify possible weight problems. It estimates body fat based on height and weight. Your health care provider can help determine your BMI and help you achieve or maintain a healthy weight.  For females 37 years of age and older:   A BMI below 18.5 is considered underweight.  A BMI of 18.5 to 24.9 is normal.  A BMI of 25 to 29.9 is considered overweight.  A BMI of 30 and above is considered obese.  Watch levels of cholesterol and blood lipids  You should start having your blood tested for lipids and cholesterol at 73 years of age, then have this test every 5 years.  You may need to have your cholesterol levels checked more often if:  Your lipid or cholesterol levels are high.  You are older than 72 years of age.  You are at high risk for heart disease.  CANCER SCREENING   Lung Cancer  Lung cancer screening is recommended for adults 36-34 years old who are at high risk for lung cancer because of a history of smoking.  A yearly low-dose CT scan of the lungs is recommended for people who:  Currently smoke.  Have quit within the past 15 years.  Have at least a 30-pack-year history of smoking. A pack year is smoking an average of one pack of cigarettes a day for 1 year.  Yearly screening should continue until it has been 15 years since you quit.  Yearly screening should stop if you develop a health problem that would prevent you from having lung cancer treatment.  Breast Cancer  Practice breast self-awareness. This means understanding how your breasts normally appear and feel.  It also means doing  regular breast self-exams. Let your health care provider know about any changes, no matter how small.  If you are in your 20s or 30s, you should have a clinical breast exam (CBE) by a health care provider every 1-3 years as part of a regular health exam.  If you are 74 or older, have a CBE every year. Also consider having a breast X-ray (mammogram) every year.  If you have a family history of breast cancer, talk to your health care provider about genetic screening.  If you are at high risk for breast cancer, talk to your health care provider about having an MRI and a mammogram every year.  Breast cancer gene (BRCA) assessment is recommended for women who have family members with BRCA-related cancers. BRCA-related cancers include:  Breast.  Ovarian.  Tubal.  Peritoneal cancers.  Results of the assessment will determine the need for genetic counseling and BRCA1 and BRCA2 testing. Cervical Cancer Your health care provider may recommend that you be screened regularly for cancer of the pelvic organs (ovaries, uterus, and vagina). This screening involves a pelvic examination, including checking for microscopic changes to the surface of your cervix (Pap test). You may be encouraged to have this screening done every 3 years, beginning at age 71.  For women ages 68-65, health care providers may recommend pelvic exams and Pap testing every 3 years, or they may  recommend the Pap and pelvic exam, combined with testing for human papilloma virus (HPV), every 5 years. Some types of HPV increase your risk of cervical cancer. Testing for HPV may also be done on women of any age with unclear Pap test results.  Other health care providers may not recommend any screening for nonpregnant women who are considered low risk for pelvic cancer and who do not have symptoms. Ask your health care provider if a screening pelvic exam is right for you.  If you have had past treatment for cervical cancer or a condition  that could lead to cancer, you need Pap tests and screening for cancer for at least 20 years after your treatment. If Pap tests have been discontinued, your risk factors (such as having a new sexual partner) need to be reassessed to determine if screening should resume. Some women have medical problems that increase the chance of getting cervical cancer. In these cases, your health care provider may recommend more frequent screening and Pap tests. Colorectal Cancer  This type of cancer can be detected and often prevented.  Routine colorectal cancer screening usually begins at 72 years of age and continues through 72 years of age.  Your health care provider may recommend screening at an earlier age if you have risk factors for colon cancer.  Your health care provider may also recommend using home test kits to check for hidden blood in the stool.  A small camera at the end of a tube can be used to examine your colon directly (sigmoidoscopy or colonoscopy). This is done to check for the earliest forms of colorectal cancer.  Routine screening usually begins at age 60.  Direct examination of the colon should be repeated every 5-10 years through 72 years of age. However, you may need to be screened more often if early forms of precancerous polyps or small growths are found. Skin Cancer  Check your skin from head to toe regularly.  Tell your health care provider about any new moles or changes in moles, especially if there is a change in a mole's shape or color.  Also tell your health care provider if you have a mole that is larger than the size of a pencil eraser.  Always use sunscreen. Apply sunscreen liberally and repeatedly throughout the day.  Protect yourself by wearing long sleeves, pants, a wide-brimmed hat, and sunglasses whenever you are outside. HEART DISEASE, DIABETES, AND HIGH BLOOD PRESSURE   High blood pressure causes heart disease and increases the risk of stroke. High blood  pressure is more likely to develop in:  People who have blood pressure in the high end of the normal range (130-139/85-89 mm Hg).  People who are overweight or obese.  People who are African American.  If you are 84-61 years of age, have your blood pressure checked every 3-5 years. If you are 17 years of age or older, have your blood pressure checked every year. You should have your blood pressure measured twice--once when you are at a hospital or clinic, and once when you are not at a hospital or clinic. Record the average of the two measurements. To check your blood pressure when you are not at a hospital or clinic, you can use:  An automated blood pressure machine at a pharmacy.  A home blood pressure monitor.  If you are between 33 years and 51 years old, ask your health care provider if you should take aspirin to prevent strokes.  Have regular diabetes screenings.  This involves taking a blood sample to check your fasting blood sugar level.  If you are at a normal weight and have a low risk for diabetes, have this test once every three years after 72 years of age.  If you are overweight and have a high risk for diabetes, consider being tested at a younger age or more often. PREVENTING INFECTION  Hepatitis B  If you have a higher risk for hepatitis B, you should be screened for this virus. You are considered at high risk for hepatitis B if:  You were born in a country where hepatitis B is common. Ask your health care provider which countries are considered high risk.  Your parents were born in a high-risk country, and you have not been immunized against hepatitis B (hepatitis B vaccine).  You have HIV or AIDS.  You use needles to inject street drugs.  You live with someone who has hepatitis B.  You have had sex with someone who has hepatitis B.  You get hemodialysis treatment.  You take certain medicines for conditions, including cancer, organ transplantation, and  autoimmune conditions. Hepatitis C  Blood testing is recommended for:  Everyone born from [birth year] through 1963.  Anyone with known risk factors for hepatitis C. Sexually transmitted infections (STIs)  You should be screened for sexually transmitted infections (STIs) including gonorrhea and chlamydia if:  You are sexually active and are younger than 72 years of age.  You are older than 72 years of age and your health care provider tells you that you are at risk for this type of infection.  Your sexual activity has changed since you were last screened and you are at an increased risk for chlamydia or gonorrhea. Ask your health care provider if you are at risk.  If you do not have HIV, but are at risk, it may be recommended that you take a prescription medicine daily to prevent HIV infection. This is called pre-exposure prophylaxis (PrEP). You are considered at risk if:  You are sexually active and do not regularly use condoms or know the HIV status of your partner(s).  You take drugs by injection.  You are sexually active with a partner who has HIV. Talk with your health care provider about whether you are at high risk of being infected with HIV. If you choose to begin PrEP, you should first be tested for HIV. You should then be tested every 3 months for as long as you are taking PrEP.  PREGNANCY   If you are premenopausal and you may become pregnant, ask your health care provider about preconception counseling.  If you may become pregnant, take 400 to 800 micrograms (mcg) of folic acid every day.  If you want to prevent pregnancy, talk to your health care provider about birth control (contraception). OSTEOPOROSIS AND MENOPAUSE   Osteoporosis is a disease in which the bones lose minerals and strength with aging. This can result in serious bone fractures. Your risk for osteoporosis can be identified using a bone density scan.  If you are 58 years of age or older, or if you are at risk  for osteoporosis and fractures, ask your health care provider if you should be screened.  Ask your health care provider whether you should take a calcium or vitamin D supplement to lower your risk for osteoporosis.  Menopause may have certain physical symptoms and risks.  Hormone replacement therapy may reduce some of these symptoms and risks. Talk to your health care provider  about whether hormone replacement therapy is right for you.  HOME CARE INSTRUCTIONS   Schedule regular health, dental, and eye exams.  Stay current with your immunizations.   Do not use any tobacco products including cigarettes, chewing tobacco, or electronic cigarettes.  If you are pregnant, do not drink alcohol.  If you are breastfeeding, limit how much and how often you drink alcohol.  Limit alcohol intake to no more than 1 drink per day for nonpregnant women. One drink equals 12 ounces of beer, 5 ounces of wine, or 1 ounces of hard liquor.  Do not use street drugs.  Do not share needles.  Ask your health care provider for help if you need support or information about quitting drugs.  Tell your health care provider if you often feel depressed.  Tell your health care provider if you have ever been abused or do not feel safe at home.   This information is not intended to replace advice given to you by your health care provider. Make sure you discuss any questions you have with your health care provider.   Document Released: 11/29/2010 Document Revised: 06/06/2014 Document Reviewed: 04/17/2013 Elsevier Interactive Patient Education [employer].

## 2015-03-19 NOTE — Progress Notes (Signed)
Subjective:   Jasmine Esparza is a 74 y.o. female who presents for Medicare Annual (Subsequent) preventive examination.  Review of Systems:  HRA assessment completed during visit;   The Patient was informed that this wellness visit is to identify risk and educate on how to reduce risk for increase disease through lifestyle changes.   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  Had to move him to a new group home recently but he is in a "good" one at present. Worried about his cho and medical issues and if she making the "right' decisions. Let her ventilate and gave feedback; Discussed the possibility of getting her sister to assist with the planning and medical decision so they can decide together and assist her to carry the burden. She agreed this may be helpful.   Admits her Mood has changed some; gets a little more emotional; Denies depression; states she enjoys dancing and other activities; continues to run errands; cook; cleans without failure but recall was 2 of 3 today and refused to do serial 7's from 100 past 1. Educated on overall counseling to assist with the stress as well as anti-depressants for a short period of time may be helpful and she can discuss this with the doctor if needed. Otherwise; Dressed appropriately, responsive at today's session; AD8 score  was 1 in that the patient stated she could not manage the bills as she did, but laughed and stated there is not enough money to balance the check book.   Medical issues  Osteo has had prolia (3 injections) pre the patient Discussed meds and exercise and the need for Calcium and Vit D. Takes 1200 of calcium every day and Vit D is in multi-vitamin   BMI: 21.4 Diet; cooks oatmeal; 2 cups of green tea; Lean cuisine;  Supper; salmon on Tuesday; grilled chicken; eats very healthy; hardly ever eats beef;  Exercise; Recreation Center; for those 55 and over;  Various classes; mixed aerobic; likes fast pace  Clean home; takes dog out; Up at 6:30 fix breakfast; clean; go to exercise or start cleaning ? tid  ? Ballroom dancing; dance class Monday am; Dance every Sat; Sunday  SAFETY/ 2 story home; will probably move if they cannot keep up home or have other issues; thinking about [employer];   Safety reviewed for the home; including removal of clutter; clear paths through the home,  bathroom safety; community safety; smoke detectors as well as sun protection;  Driving accidents (no)  and seatbelt (yes) Sun protection ; yes uses sunscreen   Stressors; 1-5 is sometimes a 7 due to POA over developmentally disabled brother as noted above  Fall assessment: no falls  Mobilization and Functional losses in the last year. no Sleep patterns; good   Urinary or fecal incontinence reviewed/ no issues  Counseling: Hep  C educated but declined Hep C antibody test Colonoscopy; 05/11/2008 EKG 03/11/2013 Dexa scan; 03/12/2013 (LS -3.2) Dexa scan completed yesterday at [facility];  Has not seen results  Mammogram 03/12/2013 no evidence of malignancy - due this year;  Declines repeat for now but did agree to do self breast exam;   Hearing: 2000hz  right ear; 500 hz left ear today; having hearing test annually, as she knows her hearing is decreasing Ophthalmology exam; once a year; had cataract surgery    Immunizations Due  Prevnar - will take today after education Zoster; defer for now but was educated   Current Care Team reviewed and updated  Cardiac Risk Factors include: advanced age (>3155men, 65>65 women);hypertension     Objective:     Vitals: BP 138/80 mmHg  Ht 5\' 2"  (1.575 m)  Wt 117 lb (53.071 kg)  BMI 21.39 kg/m2  Tobacco History  Smoking status  . Never Smoker   Smokeless tobacco  . Not on file    Comment: Married, lives with spouse. retired     Counseling given: Not Answered   Past Medical History  Diagnosis Date  . ALLERGIC RHINITIS   . ECZEMA   . GOITER, MULTINODULAR     s/p I-131 ablation 10/2009  . Hypercalcemia   . HYPERLIPIDEMIA   . OSTEOPOROSIS    Past Surgical History  Procedure Laterality Date  . Cataract extraction w/ intraocular lens implant Bilateral 2013   Family History  Problem Relation Age of Onset  . Stroke Mother   . Arthritis Mother   . Hypertension Mother   . Prostate cancer Father   . Arthritis Father   . Hypertension Sister   . Osteoporosis Sister   . Hypertension Brother   . Hyperlipidemia Brother    History  Sexual Activity  . Sexual Activity: Not on file    Outpatient Encounter Prescriptions as of 03/19/2015  Medication Sig  . Calcium Carbonate-Vitamin D (CALTRATE 600+D) 600-400 MG-UNIT per tablet Take 1 tablet by mouth 2 (two) times daily.    . Multiple Vitamin (MULTIVITAMIN) tablet Take 1 tablet by mouth daily.     No facility-administered encounter medications on file as of 03/19/2015.    Activities of Daily Living In your present state of health, do you have any difficulty performing the following activities: 03/19/2015 05/20/2014  Hearing? Y N  Vision? N N  Difficulty concentrating or making decisions? N N  Walking or climbing stairs? N N  Dressing or bathing? N N  Doing errands, shopping? N N  Preparing Food and eating ? N -  Using the Toilet? N -  In the past six months, have you accidently leaked  urine? N -  Do you have problems with loss of bowel control? N -  Managing your Medications? N -  Managing your Finances? N -  Housekeeping or managing your Housekeeping? N -    Patient Care Team: Myrlene BrokerElizabeth A Crawford, MD as PCP - General (Internal Medicine) Romero BellingSean Ellison, MD (Endocrinology) Tyrone SchimkeKarl Stonecipher, MD (Ophthalmology)    Assessment:    Assessment   Patient presents for yearly preventative medicine examination. Medicare questionnaire screening were completed, i.e. Functional; fall risk; depression, memory loss and hearing. Discussed memory; hearing and depression. Encouraged to seek assistance with MD if she noted inc'ing sadness or more frequent episodes.   All immunizations and health maintenance protocols were reviewed with the patient and needed orders were placed. Declined shingles for now. Educated on Part D side of benefit;  Given information on  the shingles vaccine. Educated on Sylva and did agree to take this today.   Education provided for laboratory screens;  Reviewed Lipids   Medication reconciliation, past medical history, social history, problem list and allergies were reviewed in detail with the patient  Educated on stress and exercise; as well as asking sister to assist with decision making involving brother; diet is good; does not over eat  Goals were established to continue to enjoy her life and dancing which brings her great joy.  End of life planning was discussed and has been completed. Will bring a copy for the chart.    Exercise Activities and Dietary recommendations Current Exercise Habits:: Structured exercise class, Type of exercise: strength training/weights (also walks; ), Time (Minutes): 50  Goals    . pateint goal     Wants to maintain health and keep dancing       Fall Risk Fall Risk  03/19/2015 03/19/2015 05/20/2014 03/11/2013  Falls in the past year? No No No No   Depression Screen PHQ 2/9 Scores 03/19/2015 05/20/2014  03/11/2013  PHQ - 2 Score 0 0 0    See prior note Cognitive Testing MMSE - Mini Mental State Exam 03/19/2015  Not completed: (No Data)   Ad8 score 1; not as good at managing checking book;    Immunization History  Administered Date(s) Administered  . Influenza Split 02/22/2011, 03/05/2012  . Influenza Whole 02/02/2009, 02/19/2010  . Influenza, High Dose Seasonal PF 02/27/2014  . Influenza,inj,Quad PF,36+ Mos 02/28/2013, 03/03/2015  . Pneumococcal Conjugate-13 03/19/2015  . Pneumococcal Polysaccharide-23 03/11/2013  . Td 09/08/2005   Screening Tests Health Maintenance  Topic Date Due  . ZOSTAVAX  06/26/2002  . MAMMOGRAM  03/13/2015  . TETANUS/TDAP  09/09/2015  . INFLUENZA VACCINE  12/29/2015  . COLONOSCOPY  03/11/2018  . DEXA SCAN  Completed  . PNA vac Low Risk Adult  Completed      Plan:     Will take Prevnar today  During the course of the visit the patient was educated and counseled about the following appropriate screening and preventive services:   Vaccines to include Pneumoccal, Influenza, Hepatitis B, Td, Zostavax, HCV  Electrocardiogram 02/2013  Cardiovascular Disease/ will recheck lipids at next apt  Colorectal cancer screening; due again 2019  Bone density screening/ just had yesterday; results pending  Diabetes screening/ n/a  Glaucoma screening/ will schedule to do this year  Mammography/PAP declined/will do sbe  Nutrition counseling / eats well; watches portions  Patient Instructions (the written plan) was given to the patient.   Montine Circle, RN  03/19/2015

## 2015-04-03 ENCOUNTER — Encounter: Payer: Self-pay | Admitting: Internal Medicine

## 2015-04-30 ENCOUNTER — Telehealth: Payer: Self-pay | Admitting: Internal Medicine

## 2015-04-30 NOTE — Telephone Encounter (Signed)
Pt is requesting results from her bone density she had done at [facility]. I seen the mammography was sent over, but can't tell if the bone density has been sent to us. Can you please call the pt

## 2015-05-01 NOTE — Telephone Encounter (Signed)
Left message for patient to call back to discuss DEXA results.

## 2015-06-16 ENCOUNTER — Telehealth: Payer: Self-pay | Admitting: Internal Medicine

## 2015-06-16 NOTE — Telephone Encounter (Signed)
Pt called try to schedule an injection for prolia. Can you please get this verify and me know so i can schedule an injection for a week our and Delaney Meigs to order the prolia.   Phone # [phone]

## 2015-07-03 NOTE — Telephone Encounter (Signed)
I have electronically submitted pt's info for Prolia insurance verification and will notify you once I have a response. Thank you. °

## 2015-07-13 NOTE — Telephone Encounter (Signed)
Pt called to check up on this. Please check and we will give her a call back

## 2015-07-14 NOTE — Telephone Encounter (Signed)
Thank you, please fax to [phone] att [facility] please. i will get it back to you ASAp

## 2015-07-14 NOTE — Telephone Encounter (Signed)
I have rec'd Jasmine Esparza's ins verification for Prolia, however, Monia Pouch is requiring a prior authorization.  The [employer] form is complete, with the exception of Section G, which has questions to be answered and also Dr. Okey Dupre needs sign the bottom.  Once the form is complete, please return to my attn via fax #[phone].  Would you like me to fax it to [phone]? Or a different #?  Thank you.

## 2015-07-15 NOTE — Telephone Encounter (Signed)
Rose we do not have.  I was check on this for [facility].  Can you please try to fax to her attn to [phone] or [phone].  Thanks!

## 2015-07-15 NOTE — Telephone Encounter (Signed)
I have faxed to your attn.  Please let me know if you do not receive it.  Thank you.

## 2015-07-15 NOTE — Telephone Encounter (Signed)
The fax confirmation failed twice.  I faxed it a 3rd time and see there is no answer.  Is the fax on/plugged in?  I've faxed it again, please let me know if you get it or not.  Thank you.

## 2015-07-17 NOTE — Telephone Encounter (Signed)
Received paper work, put on [place] to fill out and sign.

## 2015-07-21 NOTE — Telephone Encounter (Signed)
Faxed paper work back to [place].

## 2015-07-29 ENCOUNTER — Telehealth: Payer: Self-pay

## 2015-07-29 NOTE — Telephone Encounter (Signed)
Please see original Prolia note dated 06/16/2015. Thank you.

## 2015-07-29 NOTE — Telephone Encounter (Signed)
I did not fax anything to you today, the fax I was referring to was the one sent to [facility] for prior authorization.  Is that what you thought I sent to you?

## 2015-07-29 NOTE — Telephone Encounter (Signed)
Hey, i have already called Prolia and got PPO#----have you had a chance to do prior auth---and what is your estimation of patient cost?

## 2015-07-29 NOTE — Telephone Encounter (Signed)
Yes, I have already completed the prior auth and sent to [facility].  Pt's est responsibility is $205 as noted below.   There is also info below in ref to pt assistance in the event she needs it.

## 2015-07-29 NOTE — Telephone Encounter (Signed)
Yes, thought you meant faxed over here to [facility]----can you give me PPO#

## 2015-07-29 NOTE — Telephone Encounter (Signed)
Can you please send summary of benefits to me for this patient----if you need to talk with me, Arionne Iams #[phone]---thanks---patient requesting prolia

## 2015-07-29 NOTE — Telephone Encounter (Signed)
Rose, i have checked all 3 fax machines, i cannot find fax----can you give me patient's PPO #  ?

## 2015-07-29 NOTE — Telephone Encounter (Signed)
Patient advised that i have checked with prolia pharm rep---we are definitely waiting on prior authorization from patients insurance company---request was submitted to prolia by rose brewer on 07/03/15---once insurance approves, we can schedule patient for prolia injection with the estimated out of pocket expense as described below----patient also advised that someone from [facility] office will call her back when we get PA #---patient also given prolia assistance number if she wants to request assistance with copay estimated amount due

## 2015-07-29 NOTE — Telephone Encounter (Signed)
I have faxed the completed [employer] p/a form along w/Dexa scan results from 03/17/2015.  As I told Cindee Lame, it will be 7-14 days turn around time, hopefully they won't take that long and if they authorize it, I will add the authorization # to this note.  I usually wait for the authorization # to send the summary of benefits info in case there are any issues w/it, but I am sending that info along now. Pt will have an estimated responsibility of $205 for the Prolia injection.  My apologies for the delay.  If pt cannot afford $195 for her injection, please advise her to contact Prolia at [phone] and select option #1 to see if she qualifies for one of their assistance programs.  If she qualifies they will instruct her how to proceed.    Once pt recs injection, please let me know actual injection date so I can update the Prolia portal.  If you have any questions, please let me know.  Thank you.

## 2015-07-30 NOTE — Telephone Encounter (Signed)
i have advised patient of note below---advised patient we will be in touch after we hear about Part B authorization request

## 2015-07-30 NOTE — Telephone Encounter (Signed)
Tonya from [place] called to advise Prolia has been denied under Ms. Reeg's part D, but she will forward to Ms. Atteberry's part B for authorization.  She says the medication requests are automatically sent to Part D first.  Please advise Ms. Haddox to not be alarmed if she rec's a denial from her part D.  If you have any questions, please let me know. Thank you.

## 2015-08-10 ENCOUNTER — Telehealth: Payer: Self-pay

## 2015-08-10 NOTE — Telephone Encounter (Signed)
error 

## 2015-08-12 NOTE — Telephone Encounter (Signed)
I spoke to TXU Corpmelda @ [employer] and rec'd approval #[id number] for Prolia, effective 07/29/2015-07/28/2016.  Pt's cost info is in note below dated 07/29/2015.

## 2015-08-14 NOTE — Telephone Encounter (Signed)
Pt called in and she received her letter of authorization. I went ahead and scheduled her for next week and I put her name on one of the medications.

## 2015-08-18 ENCOUNTER — Ambulatory Visit (INDEPENDENT_AMBULATORY_CARE_PROVIDER_SITE_OTHER): Payer: Medicare HMO

## 2015-08-18 DIAGNOSIS — M810 Age-related osteoporosis without current pathological fracture: Secondary | ICD-10-CM

## 2015-08-18 MED ORDER — DENOSUMAB 60 MG/ML ~~LOC~~ SOLN
60.0000 mg | Freq: Once | SUBCUTANEOUS | Status: AC
  Administered 2015-08-18: 60 mg via SUBCUTANEOUS

## 2015-09-04 NOTE — Telephone Encounter (Signed)
Recd injection 08/18/15

## 2015-09-07 ENCOUNTER — Telehealth: Payer: Self-pay

## 2015-09-07 NOTE — Telephone Encounter (Signed)
Pt request to speak to Delaney Meigsamara, pt stated she did not have to pay anything when she got it in 05/2015. Please call her back

## 2015-09-07 NOTE — Telephone Encounter (Signed)
Left message advising that prolia injection will be an ESTIMATE of $245 out of pocket cost ----patient should call back to schedule nurse visit if she wants to continue with prolia and is ok with the estimated expense----any questions, can talk with tamara

## 2015-09-08 NOTE — Telephone Encounter (Signed)
Jasmine Esparza is contacting Jasmine Esparza with prolia, then will call patient back

## 2015-09-08 NOTE — Telephone Encounter (Signed)
Pt returned your call.  

## 2015-09-08 NOTE — Telephone Encounter (Signed)
Left message asking patient to call back and talk with tamara about prolia injection cost

## 2015-09-22 ENCOUNTER — Other Ambulatory Visit (INDEPENDENT_AMBULATORY_CARE_PROVIDER_SITE_OTHER): Payer: Medicare HMO

## 2015-09-22 ENCOUNTER — Telehealth: Payer: Self-pay | Admitting: Geriatric Medicine

## 2015-09-22 ENCOUNTER — Ambulatory Visit (INDEPENDENT_AMBULATORY_CARE_PROVIDER_SITE_OTHER): Payer: Medicare HMO | Admitting: Internal Medicine

## 2015-09-22 ENCOUNTER — Encounter: Payer: Self-pay | Admitting: Internal Medicine

## 2015-09-22 VITALS — BP 138/82 | HR 66 | Temp 98.6°F | Resp 16 | Ht 61.0 in | Wt 120.0 lb

## 2015-09-22 DIAGNOSIS — Z0000 Encounter for general adult medical examination without abnormal findings: Secondary | ICD-10-CM

## 2015-09-22 DIAGNOSIS — M810 Age-related osteoporosis without current pathological fracture: Secondary | ICD-10-CM

## 2015-09-22 LAB — COMPREHENSIVE METABOLIC PANEL
ALT: 18 U/L (ref 0–35)
AST: 24 U/L (ref 0–37)
Albumin: 4.4 g/dL (ref 3.5–5.2)
Alkaline Phosphatase: 74 U/L (ref 39–117)
BUN: 13 mg/dL (ref 6–23)
CHLORIDE: 105 meq/L (ref 96–112)
CO2: 29 mEq/L (ref 19–32)
Calcium: 9.2 mg/dL (ref 8.4–10.5)
Creatinine, Ser: 0.8 mg/dL (ref 0.40–1.20)
GFR: 74.68 mL/min (ref >60.00)
GLUCOSE: 98 mg/dL (ref 70–99)
POTASSIUM: 4.9 meq/L (ref 3.5–5.1)
SODIUM: 140 meq/L (ref 135–145)
Total Bilirubin: 0.4 mg/dL (ref 0.2–1.2)
Total Protein: 7.1 g/dL (ref 6.0–8.3)

## 2015-09-22 LAB — LIPID PANEL
Cholesterol: 202 mg/dL — ABNORMAL HIGH (ref 0–200)
HDL: 68.8 mg/dL (ref >39.00)
LDL Cholesterol: 110 mg/dL — ABNORMAL HIGH (ref 0–99)
NONHDL: 132.78
Total CHOL/HDL Ratio: 3
Triglycerides: 113 mg/dL (ref 0.0–149.0)
VLDL: 22.6 mg/dL (ref 0.0–40.0)

## 2015-09-22 LAB — CBC
HEMATOCRIT: 44.1 % (ref 36.0–46.0)
HEMOGLOBIN: 14.8 g/dL (ref 12.0–15.0)
MCHC: 33.6 g/dL (ref 30.0–36.0)
MCV: 92.3 fl (ref 78.0–100.0)
Platelets: 279 10*3/uL (ref 150.0–400.0)
RBC: 4.78 Mil/uL (ref 3.87–5.11)
RDW: 13 % (ref 11.5–15.5)
WBC: 5.9 10*3/uL (ref 4.0–10.5)

## 2015-09-22 LAB — VITAMIN D 25 HYDROXY (VIT D DEFICIENCY, FRACTURES): VITD: 31.59 ng/mL (ref 30.00–100.00)

## 2015-09-22 NOTE — Telephone Encounter (Signed)
Patient has already recd prolia injection in march/2017---patient thought she had to verify insurance again right now for next injection due in sept/2017---i have explained how prolia portal works and that we will contact patient closer to the next injection date to let her know what her insurance approval says---no further action needed at this time

## 2015-09-22 NOTE — Progress Notes (Signed)
   Subjective:    Patient ID: Jasmine DerbyLinda V Donahue, female    DOB: 1940-10-18, 75 y.o.   MRN: [id number]  HPI Here for medicare wellness, no new complaints. Please see A/P for status and treatment of chronic medical problems.   Diet: heart healthy Physical activity: active Depression/mood screen: negative Hearing: intact to whispered voice Visual acuity: grossly normal, performs annual eye exam  ADLs: capable Fall risk: none Home safety: good Cognitive evaluation: intact to orientation, naming, recall and repetition EOL planning: adv directives discussed, in place  I have personally reviewed and have noted 1. The patient's medical and social history - reviewed today no changes 2. Their use of alcohol, tobacco or illicit drugs 3. Their current medications and supplements 4. The patient's functional ability including ADL's, fall risks, home safety risks and hearing or visual impairment. 5. Diet and physical activities 6. Evidence for depression or mood disorders 7. Care team reviewed and updated (available in snapshot)  Review of Systems  Constitutional: Negative for fever, activity change, appetite change and fatigue.  HENT: Negative.   Eyes: Negative.   Respiratory: Negative for cough, chest tightness, shortness of breath and wheezing.   Cardiovascular: Negative for chest pain, palpitations and leg swelling.  Gastrointestinal: Negative.   Musculoskeletal: Negative.   Skin: Negative.   Neurological: Negative.   Psychiatric/Behavioral: Negative.      Objective:   Physical Exam  Constitutional: She is oriented to person, place, and time. She appears well-developed and well-nourished.  HENT:  Head: Normocephalic and atraumatic.  Eyes: EOM are normal.  Neck: Normal range of motion.  Cardiovascular: Normal rate and regular rhythm.   No murmur heard. Pulmonary/Chest: Effort normal. No respiratory distress. She has no wheezes.  Abdominal: Soft. Bowel sounds are normal. She exhibits  no distension. There is no tenderness. There is no rebound.  Musculoskeletal: She exhibits no edema.  Neurological: She is alert and oriented to person, place, and time. Coordination normal.  Skin: Skin is warm and dry.  Psychiatric: She has a normal mood and affect.   Filed Vitals:   09/22/15 1013  BP: 160/60  Pulse: 66  Temp: 98.6 F (37 C)  TempSrc: Oral  Resp: 16  Height: 5\' 1"  (1.549 m)  Weight: 120 lb (54.432 kg)  SpO2: 98%      Assessment & Plan:

## 2015-09-22 NOTE — Telephone Encounter (Signed)
Please call patient to update her on the status of her next prolia injection, thanks

## 2015-09-22 NOTE — Assessment & Plan Note (Signed)
Getting prolia twice a year and next due this summer.

## 2015-09-22 NOTE — Progress Notes (Signed)
Pre visit review using our clinic review tool, if applicable. No additional management support is needed unless otherwise documented below in the visit note. 

## 2015-09-22 NOTE — Assessment & Plan Note (Addendum)
Checking labs, declines tdap and shingles. Gets flu shot yearly. Colonoscopy due next year. Bone density due in 2019. Counseled on the need for sunscreen and mole surveillance. Given 10 year screening recommendations.

## 2015-09-22 NOTE — Telephone Encounter (Signed)
i have explained prolia portal to patient, we will contact patient when next injection is due around sept/2017

## 2015-09-22 NOTE — Patient Instructions (Signed)
We will check the labs today and call you back about the results.   Health Maintenance, Female Adopting a healthy lifestyle and getting preventive care can go a long way to promote health and wellness. Talk with your health care provider about what schedule of regular examinations is right for you. This is a good chance for you to check in with your provider about disease prevention and staying healthy. In between checkups, there are plenty of things you can do on your own. Experts have done a lot of research about which lifestyle changes and preventive measures are most likely to keep you healthy. Ask your health care provider for more information. WEIGHT AND DIET  Eat a healthy diet  Be sure to include plenty of vegetables, fruits, low-fat dairy products, and lean protein.  Do not eat a lot of foods high in solid fats, added sugars, or salt.  Get regular exercise. This is one of the most important things you can do for your health.  Most adults should exercise for at least 150 minutes each week. The exercise should increase your heart rate and make you sweat (moderate-intensity exercise).  Most adults should also do strengthening exercises at least twice a week. This is in addition to the moderate-intensity exercise.  Maintain a healthy weight  Body mass index (BMI) is a measurement that can be used to identify possible weight problems. It estimates body fat based on height and weight. Your health care provider can help determine your BMI and help you achieve or maintain a healthy weight.  For females 22 years of age and older:   A BMI below 18.5 is considered underweight.  A BMI of 18.5 to 24.9 is normal.  A BMI of 25 to 29.9 is considered overweight.  A BMI of 30 and above is considered obese.  Watch levels of cholesterol and blood lipids  You should start having your blood tested for lipids and cholesterol at 73 years of age, then have this test every 5 years.  You may need  to have your cholesterol levels checked more often if:  Your lipid or cholesterol levels are high.  You are older than 73 years of age.  You are at high risk for heart disease.  CANCER SCREENING   Lung Cancer  Lung cancer screening is recommended for adults 73-22 years old who are at high risk for lung cancer because of a history of smoking.  A yearly low-dose CT scan of the lungs is recommended for people who:  Currently smoke.  Have quit within the past 15 years.  Have at least a 30-pack-year history of smoking. A pack year is smoking an average of one pack of cigarettes a day for 1 year.  Yearly screening should continue until it has been 15 years since you quit.  Yearly screening should stop if you develop a health problem that would prevent you from having lung cancer treatment.  Breast Cancer  Practice breast self-awareness. This means understanding how your breasts normally appear and feel.  It also means doing regular breast self-exams. Let your health care provider know about any changes, no matter how small.  If you are in your 20s or 30s, you should have a clinical breast exam (CBE) by a health care provider every 1-3 years as part of a regular health exam.  If you are 63 or older, have a CBE every year. Also consider having a breast X-ray (mammogram) every year.  If you have a family history  of breast cancer, talk to your health care provider about genetic screening.  If you are at high risk for breast cancer, talk to your health care provider about having an MRI and a mammogram every year.  Breast cancer gene (BRCA) assessment is recommended for women who have family members with BRCA-related cancers. BRCA-related cancers include:  Breast.  Ovarian.  Tubal.  Peritoneal cancers.  Results of the assessment will determine the need for genetic counseling and BRCA1 and BRCA2 testing. Cervical Cancer Your health care provider may recommend that you be  screened regularly for cancer of the pelvic organs (ovaries, uterus, and vagina). This screening involves a pelvic examination, including checking for microscopic changes to the surface of your cervix (Pap test). You may be encouraged to have this screening done every 3 years, beginning at age 39.  For women ages 39-65, health care providers may recommend pelvic exams and Pap testing every 3 years, or they may recommend the Pap and pelvic exam, combined with testing for human papilloma virus (HPV), every 5 years. Some types of HPV increase your risk of cervical cancer. Testing for HPV may also be done on women of any age with unclear Pap test results.  Other health care providers may not recommend any screening for nonpregnant women who are considered low risk for pelvic cancer and who do not have symptoms. Ask your health care provider if a screening pelvic exam is right for you.  If you have had past treatment for cervical cancer or a condition that could lead to cancer, you need Pap tests and screening for cancer for at least 20 years after your treatment. If Pap tests have been discontinued, your risk factors (such as having a new sexual partner) need to be reassessed to determine if screening should resume. Some women have medical problems that increase the chance of getting cervical cancer. In these cases, your health care provider may recommend more frequent screening and Pap tests. Colorectal Cancer  This type of cancer can be detected and often prevented.  Routine colorectal cancer screening usually begins at 73 years of age and continues through 73 years of age.  Your health care provider may recommend screening at an earlier age if you have risk factors for colon cancer.  Your health care provider may also recommend using home test kits to check for hidden blood in the stool.  A small camera at the end of a tube can be used to examine your colon directly (sigmoidoscopy or colonoscopy).  This is done to check for the earliest forms of colorectal cancer.  Routine screening usually begins at age 70.  Direct examination of the colon should be repeated every 5-10 years through 73 years of age. However, you may need to be screened more often if early forms of precancerous polyps or small growths are found. Skin Cancer  Check your skin from head to toe regularly.  Tell your health care provider about any new moles or changes in moles, especially if there is a change in a mole's shape or color.  Also tell your health care provider if you have a mole that is larger than the size of a pencil eraser.  Always use sunscreen. Apply sunscreen liberally and repeatedly throughout the day.  Protect yourself by wearing long sleeves, pants, a wide-brimmed hat, and sunglasses whenever you are outside. HEART DISEASE, DIABETES, AND HIGH BLOOD PRESSURE   High blood pressure causes heart disease and increases the risk of stroke. High blood pressure is  more likely to develop in:  People who have blood pressure in the high end of the normal range (130-139/85-89 mm Hg).  People who are overweight or obese.  People who are African American.  If you are 11-85 years of age, have your blood pressure checked every 3-5 years. If you are 84 years of age or older, have your blood pressure checked every year. You should have your blood pressure measured twice--once when you are at a hospital or clinic, and once when you are not at a hospital or clinic. Record the average of the two measurements. To check your blood pressure when you are not at a hospital or clinic, you can use:  An automated blood pressure machine at a pharmacy.  A home blood pressure monitor.  If you are between 23 years and 12 years old, ask your health care provider if you should take aspirin to prevent strokes.  Have regular diabetes screenings. This involves taking a blood sample to check your fasting blood sugar level.  If you  are at a normal weight and have a low risk for diabetes, have this test once every three years after 73 years of age.  If you are overweight and have a high risk for diabetes, consider being tested at a younger age or more often. PREVENTING INFECTION  Hepatitis B  If you have a higher risk for hepatitis B, you should be screened for this virus. You are considered at high risk for hepatitis B if:  You were born in a country where hepatitis B is common. Ask your health care provider which countries are considered high risk.  Your parents were born in a high-risk country, and you have not been immunized against hepatitis B (hepatitis B vaccine).  You have HIV or AIDS.  You use needles to inject street drugs.  You live with someone who has hepatitis B.  You have had sex with someone who has hepatitis B.  You get hemodialysis treatment.  You take certain medicines for conditions, including cancer, organ transplantation, and autoimmune conditions. Hepatitis C  Blood testing is recommended for:  Everyone born from [birth year] through 1963.  Anyone with known risk factors for hepatitis C. Sexually transmitted infections (STIs)  You should be screened for sexually transmitted infections (STIs) including gonorrhea and chlamydia if:  You are sexually active and are younger than 73 years of age.  You are older than 73 years of age and your health care provider tells you that you are at risk for this type of infection.  Your sexual activity has changed since you were last screened and you are at an increased risk for chlamydia or gonorrhea. Ask your health care provider if you are at risk.  If you do not have HIV, but are at risk, it may be recommended that you take a prescription medicine daily to prevent HIV infection. This is called pre-exposure prophylaxis (PrEP). You are considered at risk if:  You are sexually active and do not regularly use condoms or know the HIV status of your  partner(s).  You take drugs by injection.  You are sexually active with a partner who has HIV. Talk with your health care provider about whether you are at high risk of being infected with HIV. If you choose to begin PrEP, you should first be tested for HIV. You should then be tested every 3 months for as long as you are taking PrEP.  PREGNANCY   If you are premenopausal and you  may become pregnant, ask your health care provider about preconception counseling.  If you may become pregnant, take 400 to 800 micrograms (mcg) of folic acid every day.  If you want to prevent pregnancy, talk to your health care provider about birth control (contraception). OSTEOPOROSIS AND MENOPAUSE   Osteoporosis is a disease in which the bones lose minerals and strength with aging. This can result in serious bone fractures. Your risk for osteoporosis can be identified using a bone density scan.  If you are 75 years of age or older, or if you are at risk for osteoporosis and fractures, ask your health care provider if you should be screened.  Ask your health care provider whether you should take a calcium or vitamin D supplement to lower your risk for osteoporosis.  Menopause may have certain physical symptoms and risks.  Hormone replacement therapy may reduce some of these symptoms and risks. Talk to your health care provider about whether hormone replacement therapy is right for you.  HOME CARE INSTRUCTIONS   Schedule regular health, dental, and eye exams.  Stay current with your immunizations.   Do not use any tobacco products including cigarettes, chewing tobacco, or electronic cigarettes.  If you are pregnant, do not drink alcohol.  If you are breastfeeding, limit how much and how often you drink alcohol.  Limit alcohol intake to no more than 1 drink per day for nonpregnant women. One drink equals 12 ounces of beer, 5 ounces of wine, or 1 ounces of hard liquor.  Do not use street drugs.  Do  not share needles.  Ask your health care provider for help if you need support or information about quitting drugs.  Tell your health care provider if you often feel depressed.  Tell your health care provider if you have ever been abused or do not feel safe at home.   This information is not intended to replace advice given to you by your health care provider. Make sure you discuss any questions you have with your health care provider.   Document Released: 11/29/2010 Document Revised: 06/06/2014 Document Reviewed: 04/17/2013 Elsevier Interactive Patient Education [employer].

## 2015-09-29 ENCOUNTER — Telehealth: Payer: Self-pay

## 2015-09-29 NOTE — Telephone Encounter (Signed)
LMVOM informing pt

## 2015-09-29 NOTE — Telephone Encounter (Signed)
Patient called Jasmine Esparza back. She will be away from home 1130 to 130. Please follow up.

## 2016-02-02 ENCOUNTER — Telehealth: Payer: Self-pay

## 2016-02-02 NOTE — Telephone Encounter (Signed)
Patient is due prolia injection on or after 02/18/16----insurance verification submitted on 01/28/16---waiting for summary of benefits

## 2016-02-11 ENCOUNTER — Telehealth: Payer: Self-pay

## 2016-02-11 NOTE — Telephone Encounter (Signed)
Left message advising patient that insurance has been verified for prolia injection----estimated $210 copay for injection, can schedule nurse visit either on or after 02/19/16---can talk with Braidan Ricciardi if any questions

## 2016-02-23 ENCOUNTER — Ambulatory Visit (INDEPENDENT_AMBULATORY_CARE_PROVIDER_SITE_OTHER): Payer: Medicare HMO

## 2016-02-23 DIAGNOSIS — M810 Age-related osteoporosis without current pathological fracture: Secondary | ICD-10-CM

## 2016-02-23 MED ORDER — DENOSUMAB 60 MG/ML ~~LOC~~ SOLN
60.0000 mg | Freq: Once | SUBCUTANEOUS | Status: AC
Start: 2016-02-23 — End: 2016-02-23
  Administered 2016-02-23: 60 mg via SUBCUTANEOUS

## 2016-02-23 NOTE — Progress Notes (Signed)
Medical treatment/procedure(s) were performed by non-physician practitioner and as supervising physician I was immediately available for consultation/collaboration. I agree with above. Lucero Auzenne A Korin Hartwell, MD  

## 2016-03-01 ENCOUNTER — Ambulatory Visit (INDEPENDENT_AMBULATORY_CARE_PROVIDER_SITE_OTHER): Payer: Medicare HMO

## 2016-03-01 DIAGNOSIS — Z23 Encounter for immunization: Secondary | ICD-10-CM

## 2016-06-09 DIAGNOSIS — I83813 Varicose veins of bilateral lower extremities with pain: Secondary | ICD-10-CM | POA: Diagnosis not present

## 2016-08-10 ENCOUNTER — Telehealth: Payer: Self-pay

## 2016-08-10 NOTE — Telephone Encounter (Signed)
Insurance information has been submitted for prolia injections, after I receive summary of benefits, Corby Villasenor will contact patient to discuss prolia copay/benefits---can talk with Karima Carrell if any questions

## 2016-08-31 ENCOUNTER — Telehealth: Payer: Self-pay | Admitting: Internal Medicine

## 2016-09-20 ENCOUNTER — Telehealth: Payer: Self-pay

## 2016-09-20 NOTE — Telephone Encounter (Signed)
Insurance is requiring PA for prolia injections, I have submitted PA request to [employer], will call patient when I get approval

## 2016-11-24 ENCOUNTER — Telehealth: Payer: Self-pay | Admitting: General Practice

## 2016-11-24 ENCOUNTER — Telehealth: Payer: Self-pay

## 2016-11-24 NOTE — Telephone Encounter (Signed)
Called pt to schedule awv. Lvm for pt to call office to schedule appt.  °

## 2016-11-24 NOTE — Telephone Encounter (Signed)
Patient is on the list for [employer] 2018 and may be a good candidate for an AWV. Please let me know if/when appt is scheduled.   

## 2016-11-24 NOTE — Telephone Encounter (Signed)
Contacted patient to let her know that Prolia has been approved with at $225.00 co-pay.  Patient has agreed to come to the office on 12/01/2016 for the injection.  If any questions please contact Shawnie Dapperamara Williams, RN.

## 2016-12-01 ENCOUNTER — Ambulatory Visit (INDEPENDENT_AMBULATORY_CARE_PROVIDER_SITE_OTHER): Payer: Medicare HMO | Admitting: General Practice

## 2016-12-01 DIAGNOSIS — M810 Age-related osteoporosis without current pathological fracture: Secondary | ICD-10-CM | POA: Diagnosis not present

## 2016-12-01 DIAGNOSIS — O9882 Other maternal infectious and parasitic diseases complicating childbirth: Secondary | ICD-10-CM

## 2016-12-01 MED ORDER — DENOSUMAB 60 MG/ML ~~LOC~~ SOLN
60.0000 mg | Freq: Once | SUBCUTANEOUS | Status: AC
  Administered 2016-12-01: 60 mg via SUBCUTANEOUS

## 2016-12-05 ENCOUNTER — Encounter: Payer: Self-pay | Admitting: Nurse Practitioner

## 2016-12-05 ENCOUNTER — Other Ambulatory Visit (INDEPENDENT_AMBULATORY_CARE_PROVIDER_SITE_OTHER): Payer: Medicare HMO

## 2016-12-05 ENCOUNTER — Ambulatory Visit (INDEPENDENT_AMBULATORY_CARE_PROVIDER_SITE_OTHER): Payer: Medicare HMO | Admitting: Nurse Practitioner

## 2016-12-05 VITALS — BP 130/82 | HR 63 | Temp 98.4°F | Ht 61.0 in | Wt 116.0 lb

## 2016-12-05 DIAGNOSIS — K5904 Chronic idiopathic constipation: Secondary | ICD-10-CM | POA: Diagnosis not present

## 2016-12-05 DIAGNOSIS — Z0000 Encounter for general adult medical examination without abnormal findings: Secondary | ICD-10-CM | POA: Diagnosis not present

## 2016-12-05 DIAGNOSIS — M810 Age-related osteoporosis without current pathological fracture: Secondary | ICD-10-CM

## 2016-12-05 DIAGNOSIS — E782 Mixed hyperlipidemia: Secondary | ICD-10-CM

## 2016-12-05 DIAGNOSIS — H9193 Unspecified hearing loss, bilateral: Secondary | ICD-10-CM | POA: Diagnosis not present

## 2016-12-05 LAB — COMPREHENSIVE METABOLIC PANEL
ALK PHOS: 60 U/L (ref 39–117)
ALT: 17 U/L (ref 0–35)
AST: 19 U/L (ref 0–37)
Albumin: 4.4 g/dL (ref 3.5–5.2)
BILIRUBIN TOTAL: 0.5 mg/dL (ref 0.2–1.2)
BUN: 19 mg/dL (ref 6–23)
CO2: 29 meq/L (ref 19–32)
Calcium: 9.8 mg/dL (ref 8.4–10.5)
Chloride: 105 mEq/L (ref 96–112)
Creatinine, Ser: 0.88 mg/dL (ref 0.40–1.20)
GFR: 66.68 mL/min (ref >60.00)
GLUCOSE: 99 mg/dL (ref 70–99)
Potassium: 5.2 mEq/L — ABNORMAL HIGH (ref 3.5–5.1)
SODIUM: 140 meq/L (ref 135–145)
TOTAL PROTEIN: 7.1 g/dL (ref 6.0–8.3)

## 2016-12-05 LAB — LIPID PANEL
CHOL/HDL RATIO: 3
Cholesterol: 216 mg/dL — ABNORMAL HIGH (ref 0–200)
HDL: 66.5 mg/dL (ref >39.00)
LDL CALC: 129 mg/dL — AB (ref 0–99)
NONHDL: 149.79
Triglycerides: 102 mg/dL (ref 0.0–149.0)
VLDL: 20.4 mg/dL (ref 0.0–40.0)

## 2016-12-05 LAB — CBC
HCT: 45.3 % (ref 36.0–46.0)
Hemoglobin: 15.1 g/dL — ABNORMAL HIGH (ref 12.0–15.0)
MCHC: 33.4 g/dL (ref 30.0–36.0)
MCV: 93.1 fl (ref 78.0–100.0)
PLATELETS: 268 10*3/uL (ref 150.0–400.0)
RBC: 4.86 Mil/uL (ref 3.87–5.11)
RDW: 13.3 % (ref 11.5–15.5)
WBC: 6.3 10*3/uL (ref 4.0–10.5)

## 2016-12-05 LAB — TSH: TSH: 1.62 u[IU]/mL (ref 0.35–4.50)

## 2016-12-05 MED ORDER — DOCUSATE SODIUM 100 MG PO CAPS: 200.0000 mg | ORAL_CAPSULE | Freq: Every day | ORAL | 0 refills | Status: DC

## 2016-12-05 NOTE — Progress Notes (Signed)
Subjective:    Patient ID: Jasmine DerbyLinda V Esparza, female    DOB: 1940/12/24, 76 y.o.   MRN: [id number]  Patient presents today for complete physical  HPI  Immunizations: (TDAP, Hep C screen, Pneumovax, Influenza, zoster)  Health Maintenance  Topic Date Due  . Mammogram  03/13/2015  . Tetanus Vaccine  11/27/2017*  . Flu Shot  12/28/2016  . Colon Cancer Screening  03/11/2018  . DEXA scan (bone density measurement)  Completed  . Pneumonia vaccines  Completed  *Topic was postponed. The date shown is not the original due date.   Diet:heart healthy.  Weight:  Wt Readings from Last 3 Encounters:  12/05/16 116 lb (52.6 kg)  09/22/15 120 lb (54.4 kg)  03/19/15 117 lb (53.1 kg)   Exercise:group exercise at recreation center, ballroom dance.  Fall Risk: Fall Risk  12/05/2016 03/19/2015 03/19/2015 05/20/2014 03/11/2013  Falls in the past year? No No No No No   Home Safety:home with husband.  Depression/Suicide: [facility] screen [facility] 12/05/2016 03/19/2015 05/20/2014 03/11/2013  Decreased Interest 0 0 0 0  Down, Depressed, Hopeless 0 0 0 0  PHQ - 2 Score 0 0 0 0   MMSE - Mini Mental State Exam 03/19/2015  Not completed: (No Data)   Vision:annually, up to date  Dental:every 6months, up to date.  Advanced Directive: Advanced Directives 03/19/2015  Does Patient Have a Medical Advance Directive? Yes  Type of Advance Directive -  Does patient want to make changes to medical advance directive? -  Copy of Healthcare Power of Attorney in Chart? Yes    Medications and allergies reviewed with patient and updated if appropriate.  Patient Active Problem List   Diagnosis Date Noted  . Routine general medical examination at a health care facility 09/22/2015  . Osteoporosis 11/16/2009  . Hyperlipidemia 07/28/2009    Current Outpatient Prescriptions on File Prior to Visit  Medication Sig Dispense Refill  . Calcium Carbonate-Vitamin D (CALTRATE 600+D) 600-400 MG-UNIT per tablet Take 1  tablet by mouth 2 (two) times daily.      . Multiple Vitamin (MULTIVITAMIN) tablet Take 1 tablet by mouth daily.       No current facility-administered medications on file prior to visit.     Past Medical History:  Diagnosis Date  . ALLERGIC RHINITIS   . ECZEMA   . GOITER, MULTINODULAR    s/p I-131 ablation 10/2009  . Hypercalcemia   . HYPERLIPIDEMIA   . OSTEOPOROSIS     Past Surgical History:  Procedure Laterality Date  . CATARACT EXTRACTION W/ INTRAOCULAR LENS IMPLANT Bilateral 2013    Social History   Social History  . Marital status: Married    Spouse name: N/A  . Number of children: N/A  . Years of education: N/A   Social History Main Topics  . Smoking status: Never Smoker  . Smokeless tobacco: Never Used     Comment: Married, lives with spouse. retired  . Alcohol use No  . Drug use: No  . Sexual activity: Not on file   Other Topics Concern  . Not on file   Social History Narrative  . No narrative on file    Family History  Problem Relation Age of Onset  . Stroke Mother   . Arthritis Mother   . Hypertension Mother   . Prostate cancer Father   . Arthritis Father   . Hypertension Sister   . Osteoporosis Sister   . Hypertension Brother   . Hyperlipidemia Brother  Review of Systems  Constitutional: Negative for fever, malaise/fatigue and weight loss.  HENT: Positive for hearing loss. Negative for congestion, ear discharge, ear pain, sore throat and tinnitus.   Eyes:       Negative for visual changes  Respiratory: Negative for cough and shortness of breath.   Cardiovascular: Negative for chest pain, palpitations and leg swelling.  Gastrointestinal: Positive for constipation. Negative for blood in stool, diarrhea and heartburn.  Genitourinary: Negative for dysuria, frequency and urgency.  Musculoskeletal: Negative for falls, joint pain and myalgias.  Skin: Negative for rash.  Neurological: Negative for dizziness, sensory change and  headaches.  Endo/Heme/Allergies: Does not bruise/bleed easily.  Psychiatric/Behavioral: Negative for depression, memory loss, substance abuse and suicidal ideas. The patient is not nervous/anxious and does not have insomnia.     Objective:   Vitals:   12/05/16 0905  BP: 130/82  Pulse: 63  Temp: 98.4 F (36.9 C)    Body mass index is 21.92 kg/m.   Physical Examination:  Physical Exam  Constitutional: She is oriented to person, place, and time and well-developed, well-nourished, and in no distress. No distress.  HENT:  Right Ear: External ear normal.  Left Ear: External ear normal.  Nose: Nose normal.  Mouth/Throat: Oropharynx is clear and moist. No oropharyngeal exudate.  Eyes: Conjunctivae and EOM are normal. Pupils are equal, round, and reactive to light. No scleral icterus.  Neck: Normal range of motion. Neck supple. No thyromegaly present.  Cardiovascular: Normal rate, normal heart sounds and intact distal pulses.   Pulmonary/Chest: Effort normal and breath sounds normal. She exhibits no tenderness.  Abdominal: Soft. Bowel sounds are normal. She exhibits no distension. There is no tenderness.  Musculoskeletal: Normal range of motion. She exhibits no edema or tenderness.  Lymphadenopathy:    She has no cervical adenopathy.  Neurological: She is alert and oriented to person, place, and time. No cranial nerve deficit. Gait normal. Coordination normal.  Skin: Skin is warm and dry.  Psychiatric: Affect and judgment normal.    ASSESSMENT and PLAN:  Jasmine Esparza was seen today for annual exam.  Diagnoses and all orders for this visit:  Routine general medical examination at a health care facility -     Comprehensive metabolic panel; Future -     TSH; Future -     Lipid panel; Future -     CBC; Future  Bilateral hearing loss, unspecified hearing loss type -     Ambulatory referral to Audiology  Mixed hyperlipidemia  Chronic idiopathic constipation -     docusate sodium  (COLACE) 100 MG capsule; Take 2 capsules (200 mg total) by mouth daily.  Age-related osteoporosis without current pathological fracture -     Vitamin D 1,25 dihydroxy; Future    No problem-specific Assessment & Plan notes found for this encounter.      Follow up: Return if symptoms worsen or fail to improve.  Alysia Penna, NP

## 2016-12-05 NOTE — Patient Instructions (Signed)
Go to basement for blood draw.  Please schedule appt for mammogram  Continue exercise and dance class.  This is a list of the screening recommended for you and due dates:  Health Maintenance  Topic Date Due  . Mammogram  03/13/2015  . Tetanus Vaccine  11/27/2017*  . Flu Shot  12/28/2016  . Colon Cancer Screening  03/11/2018  . DEXA scan (bone density measurement)  Completed  . Pneumonia vaccines  Completed  *Topic was postponed. The date shown is not the original due date.   You may use colace or miralax for constipation.  If these do not help, call office for linzess or amitiza presciption.  Constipation, Adult Constipation is when a person:  Poops (has a bowel movement) fewer times in a week than normal.  Has a hard time pooping.  Has poop that is dry, hard, or bigger than normal.  Follow these instructions at home: Eating and drinking   Eat foods that have a lot of fiber, such as: ? Fresh fruits and vegetables. ? Whole grains. ? Beans.  Eat less of foods that are high in fat, low in fiber, or overly processed, such as: ? [place] fries. ? Hamburgers. ? Cookies. ? Candy. ? Soda.  Drink enough fluid to keep your pee (urine) clear or pale yellow. General instructions  Exercise regularly or as told by your doctor.  Go to the restroom when you feel like you need to poop. Do not hold it in.  Take over-the-counter and prescription medicines only as told by your doctor. These include any fiber supplements.  Do pelvic floor retraining exercises, such as: ? Doing deep breathing while relaxing your lower belly (abdomen). ? Relaxing your pelvic floor while pooping.  Watch your condition for any changes.  Keep all follow-up visits as told by your doctor. This is important. Contact a doctor if:  You have pain that gets worse.  You have a fever.  You have not pooped for 4 days.  You throw up (vomit).  You are not hungry.  You lose weight.  You are  bleeding from the anus.  You have thin, pencil-like poop (stool). Get help right away if:  You have a fever, and your symptoms suddenly get worse.  You leak poop or have blood in your poop.  Your belly feels hard or bigger than normal (is bloated).  You have very bad belly pain.  You feel dizzy or you faint. This information is not intended to replace advice given to you by your health care provider. Make sure you discuss any questions you have with your health care provider. Document Released: 11/02/2007 Document Revised: 12/04/2015 Document Reviewed: 11/04/2015 Elsevier Interactive Patient Education  2017 [employer]

## 2016-12-08 ENCOUNTER — Encounter: Payer: Self-pay | Admitting: Nurse Practitioner

## 2016-12-08 LAB — VITAMIN D 1,25 DIHYDROXY
VITAMIN D 1, 25 (OH) TOTAL: 67 pg/mL (ref 18–72)
VITAMIN D3 1, 25 (OH): 67 pg/mL
Vitamin D2 1, 25 (OH)2: <8 pg/mL

## 2017-03-09 DIAGNOSIS — M810 Age-related osteoporosis without current pathological fracture: Secondary | ICD-10-CM | POA: Diagnosis not present

## 2017-03-09 LAB — HM DEXA SCAN

## 2017-03-16 ENCOUNTER — Encounter: Payer: Self-pay | Admitting: Internal Medicine

## 2017-03-20 ENCOUNTER — Telehealth: Payer: Self-pay | Admitting: Internal Medicine

## 2017-03-20 NOTE — Telephone Encounter (Signed)
Patient wanted to know if we had results for the Bone DEXA yet?

## 2017-03-20 NOTE — Telephone Encounter (Signed)
Called patient and she states she wanted to know if we had the results in and what they were. I explained as soon as the results are sent to us we will giver her a call back.

## 2017-03-20 NOTE — Telephone Encounter (Signed)
Is she wanting results explained to her or wanting to know if we got her records? We have the records but they are not in computer yet so I cannot advise on them.

## 2017-03-23 ENCOUNTER — Ambulatory Visit (INDEPENDENT_AMBULATORY_CARE_PROVIDER_SITE_OTHER): Payer: Medicare HMO | Admitting: General Practice

## 2017-03-23 DIAGNOSIS — Z23 Encounter for immunization: Secondary | ICD-10-CM

## 2017-05-03 NOTE — Telephone Encounter (Signed)
error 

## 2017-06-20 ENCOUNTER — Ambulatory Visit: Payer: Medicare HMO | Admitting: *Deleted

## 2017-06-20 ENCOUNTER — Encounter: Payer: Self-pay | Admitting: *Deleted

## 2017-06-20 DIAGNOSIS — M810 Age-related osteoporosis without current pathological fracture: Secondary | ICD-10-CM

## 2017-06-20 MED ORDER — DENOSUMAB 60 MG/ML ~~LOC~~ SOLN
60.0000 mg | Freq: Once | SUBCUTANEOUS | Status: AC
  Administered 2017-06-20: 60 mg via SUBCUTANEOUS

## 2017-06-27 ENCOUNTER — Ambulatory Visit: Payer: Medicare HMO

## 2017-11-23 ENCOUNTER — Ambulatory Visit (INDEPENDENT_AMBULATORY_CARE_PROVIDER_SITE_OTHER): Payer: Medicare HMO | Admitting: *Deleted

## 2017-11-23 VITALS — BP 128/62 | HR 82 | Resp 18 | Ht 61.0 in | Wt 121.0 lb

## 2017-11-23 DIAGNOSIS — Z0000 Encounter for general adult medical examination without abnormal findings: Secondary | ICD-10-CM | POA: Diagnosis not present

## 2017-11-23 NOTE — Patient Instructions (Addendum)
Continue doing brain stimulating activities (puzzles, reading, adult coloring books, staying active) to keep memory sharp.   .Continue to eat heart healthy diet (full of fruits, vegetables, whole grains, lean protein, water--limit salt, fat, and sugar intake) and increase physical activity as tolerated.   [name] , Thank you for taking time to come for your Medicare Wellness Visit. I appreciate your ongoing commitment to your health goals. Please review the following plan we discussed and let me know if I can assist you in the future.   These are the goals we discussed: Goals      Patient Stated   . pateint goal (pt-stated)     Wants to maintain health and keep dancing       Other   . Patient Stated     Continue to do ball room dancing, stay socially active with friends, be active in church, enjoy life and family.       This is a list of the screening recommended for you and due dates:  Health Maintenance  Topic Date Due  . Tetanus Vaccine  11/27/2017*  . Flu Shot  12/28/2017  . Colon Cancer Screening  03/11/2018  . DEXA scan (bone density measurement)  Completed  . Pneumonia vaccines  Completed  *Topic was postponed. The date shown is not the original due date.   Health Maintenance, Female Adopting a healthy lifestyle and getting preventive care can go a long way to promote health and wellness. Talk with your health care provider about what schedule of regular examinations is right for you. This is a good chance for you to check in with your provider about disease prevention and staying healthy. In between checkups, there are plenty of things you can do on your own. Experts have done a lot of research about which lifestyle changes and preventive measures are most likely to keep you healthy. Ask your health care provider for more information. Weight and diet Eat a healthy diet  Be sure to include plenty of vegetables, fruits, low-fat dairy products, and lean protein.  Do not  eat a lot of foods high in solid fats, added sugars, or salt.  Get regular exercise. This is one of the most important things you can do for your health. ? Most adults should exercise for at least 150 minutes each week. The exercise should increase your heart rate and make you sweat (moderate-intensity exercise). ? Most adults should also do strengthening exercises at least twice a week. This is in addition to the moderate-intensity exercise.  Maintain a healthy weight  Body mass index (BMI) is a measurement that can be used to identify possible weight problems. It estimates body fat based on height and weight. Your health care provider can help determine your BMI and help you achieve or maintain a healthy weight.  For females 40 years of age and older: ? A BMI below 18.5 is considered underweight. ? A BMI of 18.5 to 24.9 is normal. ? A BMI of 25 to 29.9 is considered overweight. ? A BMI of 30 and above is considered obese.  Watch levels of cholesterol and blood lipids  You should start having your blood tested for lipids and cholesterol at 75 years of age, then have this test every 5 years.  You may need to have your cholesterol levels checked more often if: ? Your lipid or cholesterol levels are high. ? You are older than 75 years of age. ? You are at high risk for heart disease.  Cancer screening Lung Cancer  Lung cancer screening is recommended for adults 55-80 years old who are at high risk for lung cancer because of a history of smoking.  A yearly low-dose CT scan of the lungs is recommended for people who: ? Currently smoke. ? Have quit within the past 15 years. ? Have at least a 30-pack-year history of smoking. A pack year is smoking an average of one pack of cigarettes a day for 1 year.  Yearly screening should continue until it has been 15 years since you quit.  Yearly screening should stop if you develop a health problem that would prevent you from having lung cancer  treatment.  Breast Cancer  Practice breast self-awareness. This means understanding how your breasts normally appear and feel.  It also means doing regular breast self-exams. Let your health care provider know about any changes, no matter how small.  If you are in your 20s or 30s, you should have a clinical breast exam (CBE) by a health care provider every 1-3 years as part of a regular health exam.  If you are 40 or older, have a CBE every year. Also consider having a breast X-ray (mammogram) every year.  If you have a family history of breast cancer, talk to your health care provider about genetic screening.  If you are at high risk for breast cancer, talk to your health care provider about having an MRI and a mammogram every year.  Breast cancer gene (BRCA) assessment is recommended for women who have family members with BRCA-related cancers. BRCA-related cancers include: ? Breast. ? Ovarian. ? Tubal. ? Peritoneal cancers.  Results of the assessment will determine the need for genetic counseling and BRCA1 and BRCA2 testing.  Cervical Cancer Your health care provider may recommend that you be screened regularly for cancer of the pelvic organs (ovaries, uterus, and vagina). This screening involves a pelvic examination, including checking for microscopic changes to the surface of your cervix (Pap test). You may be encouraged to have this screening done every 3 years, beginning at age 21.  For women ages 30-65, health care providers may recommend pelvic exams and Pap testing every 3 years, or they may recommend the Pap and pelvic exam, combined with testing for human papilloma virus (HPV), every 5 years. Some types of HPV increase your risk of cervical cancer. Testing for HPV may also be done on women of any age with unclear Pap test results.  Other health care providers may not recommend any screening for nonpregnant women who are considered low risk for pelvic cancer and who do not have  symptoms. Ask your health care provider if a screening pelvic exam is right for you.  If you have had past treatment for cervical cancer or a condition that could lead to cancer, you need Pap tests and screening for cancer for at least 20 years after your treatment. If Pap tests have been discontinued, your risk factors (such as having a new sexual partner) need to be reassessed to determine if screening should resume. Some women have medical problems that increase the chance of getting cervical cancer. In these cases, your health care provider may recommend more frequent screening and Pap tests.  Colorectal Cancer  This type of cancer can be detected and often prevented.  Routine colorectal cancer screening usually begins at 75 years of age and continues through 75 years of age.  Your health care provider may recommend screening at an earlier age if you have risk factors   for colon cancer.  Your health care provider may also recommend using home test kits to check for hidden blood in the stool.  A small camera at the end of a tube can be used to examine your colon directly (sigmoidoscopy or colonoscopy). This is done to check for the earliest forms of colorectal cancer.  Routine screening usually begins at age 50.  Direct examination of the colon should be repeated every 5-10 years through 75 years of age. However, you may need to be screened more often if early forms of precancerous polyps or small growths are found.  Skin Cancer  Check your skin from head to toe regularly.  Tell your health care provider about any new moles or changes in moles, especially if there is a change in a mole's shape or color.  Also tell your health care provider if you have a mole that is larger than the size of a pencil eraser.  Always use sunscreen. Apply sunscreen liberally and repeatedly throughout the day.  Protect yourself by wearing long sleeves, pants, a wide-brimmed hat, and sunglasses whenever you  are outside.  Heart disease, diabetes, and high blood pressure  High blood pressure causes heart disease and increases the risk of stroke. High blood pressure is more likely to develop in: ? People who have blood pressure in the high end of the normal range (130-139/85-89 mm Hg). ? People who are overweight or obese. ? People who are African American.  If you are 18-39 years of age, have your blood pressure checked every 3-5 years. If you are 40 years of age or older, have your blood pressure checked every year. You should have your blood pressure measured twice-once when you are at a hospital or clinic, and once when you are not at a hospital or clinic. Record the average of the two measurements. To check your blood pressure when you are not at a hospital or clinic, you can use: ? An automated blood pressure machine at a pharmacy. ? A home blood pressure monitor.  If you are between 55 years and 79 years old, ask your health care provider if you should take aspirin to prevent strokes.  Have regular diabetes screenings. This involves taking a blood sample to check your fasting blood sugar level. ? If you are at a normal weight and have a low risk for diabetes, have this test once every three years after 75 years of age. ? If you are overweight and have a high risk for diabetes, consider being tested at a younger age or more often. Preventing infection Hepatitis B  If you have a higher risk for hepatitis B, you should be screened for this virus. You are considered at high risk for hepatitis B if: ? You were born in a country where hepatitis B is common. Ask your health care provider which countries are considered high risk. ? Your parents were born in a high-risk country, and you have not been immunized against hepatitis B (hepatitis B vaccine). ? You have HIV or AIDS. ? You use needles to inject street drugs. ? You live with someone who has hepatitis B. ? You have had sex with someone who  has hepatitis B. ? You get hemodialysis treatment. ? You take certain medicines for conditions, including cancer, organ transplantation, and autoimmune conditions.  Hepatitis C  Blood testing is recommended for: ? Everyone born from 1945 through 1965. ? Anyone with known risk factors for hepatitis C.  Sexually transmitted infections (STIs)    You should be screened for sexually transmitted infections (STIs) including gonorrhea and chlamydia if: ? You are sexually active and are younger than 75 years of age. ? You are older than 75 years of age and your health care provider tells you that you are at risk for this type of infection. ? Your sexual activity has changed since you were last screened and you are at an increased risk for chlamydia or gonorrhea. Ask your health care provider if you are at risk.  If you do not have HIV, but are at risk, it may be recommended that you take a prescription medicine daily to prevent HIV infection. This is called pre-exposure prophylaxis (PrEP). You are considered at risk if: ? You are sexually active and do not regularly use condoms or know the HIV status of your partner(s). ? You take drugs by injection. ? You are sexually active with a partner who has HIV.  Talk with your health care provider about whether you are at high risk of being infected with HIV. If you choose to begin PrEP, you should first be tested for HIV. You should then be tested every 3 months for as long as you are taking PrEP. Pregnancy  If you are premenopausal and you may become pregnant, ask your health care provider about preconception counseling.  If you may become pregnant, take 400 to 800 micrograms (mcg) of folic acid every day.  If you want to prevent pregnancy, talk to your health care provider about birth control (contraception). Osteoporosis and menopause  Osteoporosis is a disease in which the bones lose minerals and strength with aging. This can result in serious bone  fractures. Your risk for osteoporosis can be identified using a bone density scan.  If you are 65 years of age or older, or if you are at risk for osteoporosis and fractures, ask your health care provider if you should be screened.  Ask your health care provider whether you should take a calcium or vitamin D supplement to lower your risk for osteoporosis.  Menopause may have certain physical symptoms and risks.  Hormone replacement therapy may reduce some of these symptoms and risks. Talk to your health care provider about whether hormone replacement therapy is right for you. Follow these instructions at home:  Schedule regular health, dental, and eye exams.  Stay current with your immunizations.  Do not use any tobacco products including cigarettes, chewing tobacco, or electronic cigarettes.  If you are pregnant, do not drink alcohol.  If you are breastfeeding, limit how much and how often you drink alcohol.  Limit alcohol intake to no more than 1 drink per day for nonpregnant women. One drink equals 12 ounces of beer, 5 ounces of Montez Cuda, or 1 ounces of hard liquor.  Do not use street drugs.  Do not share needles.  Ask your health care provider for help if you need support or information about quitting drugs.  Tell your health care provider if you often feel depressed.  Tell your health care provider if you have ever been abused or do not feel safe at home. This information is not intended to replace advice given to you by your health care provider. Make sure you discuss any questions you have with your health care provider. Document Released: 11/29/2010 Document Revised: 10/22/2015 Document Reviewed: 02/17/2015 Elsevier Interactive Patient Education  2018 Elsevier Inc.  

## 2017-11-23 NOTE — Progress Notes (Signed)
Subjective:   Jasmine Esparza is a 77 y.o. female who presents for Medicare Annual (Subsequent) preventive examination.  Review of Systems:  No ROS.  Medicare Wellness Visit. Additional risk factors are reflected in the social history.  Cardiac Risk Factors include: advanced age (>1955men, 62>65 women);dyslipidemia Sleep patterns: gets up 1 times nightly to void and sleeps 7-8 hours nightly.    Home Safety/Smoke Alarms: Feels safe in home. Smoke alarms in place.  Living environment; residence and Firearm Safety: 2-story house, no firearms. Lives with husband, no needs for DME, good support system Seat Belt Safety/Bike Helmet: Wears seat belt.      Objective:     Vitals: BP 128/62   Pulse 82   Resp 18   Ht 5\' 1"  (1.549 m)   Wt 121 lb (54.9 kg)   SpO2 99%   BMI 22.86 kg/m   Body mass index is 22.86 kg/m.  Advanced Directives 11/23/2017 03/19/2015 05/20/2014  Does Patient Have a Medical Advance Directive? No Yes Yes  Type of Advance Directive - - Healthcare Power of [place];Living will  Does patient want to make changes to medical advance directive? - - No - Patient declined  Copy of Healthcare Power of Attorney in Chart? - Yes No - copy requested  Would patient like information on creating a medical advance directive? Yes (ED - Information included in AVS) - -    Tobacco Social History   Tobacco Use  Smoking Status Never Smoker  Smokeless Tobacco Never Used  Tobacco Comment   Married, lives with spouse. retired     Counseling given: Not Answered Comment: Married, lives with spouse. retired  Past Medical History:  Diagnosis Date  . ALLERGIC RHINITIS   . ECZEMA   . GOITER, MULTINODULAR    s/p I-131 ablation 10/2009  . Hypercalcemia   . HYPERLIPIDEMIA   . OSTEOPOROSIS    Past Surgical History:  Procedure Laterality Date  . CATARACT EXTRACTION W/ INTRAOCULAR LENS IMPLANT Bilateral 2013   Family History  Problem Relation Age of Onset  . Stroke Mother   .  Arthritis Mother   . Hypertension Mother   . Prostate cancer Father   . Arthritis Father   . Hypertension Sister   . Osteoporosis Sister   . Hypertension Brother   . Hyperlipidemia Brother    Social History   Socioeconomic History  . Marital status: Married    Spouse name: Not on file  . Number of children: 3  . Years of education: Not on file  . Highest education level: Not on file  Occupational History  . Not on file  Social Needs  . Financial resource strain: Not hard at all  . Food insecurity:    Worry: Never true    Inability: Never true  . Transportation needs:    Medical: No    Non-medical: No  Tobacco Use  . Smoking status: Never Smoker  . Smokeless tobacco: Never Used  . Tobacco comment: Married, lives with spouse. retired  Substance and Sexual Activity  . Alcohol use: No  . Drug use: No  . Sexual activity: Not Currently  Lifestyle  . Physical activity:    Days per week: 5 days    Minutes per session: 40 min  . Stress: Not at all  Relationships  . Social connections:    Talks on phone: More than three times a week    Gets together: More than three times a week    Attends religious service: More  than 4 times per year    Active member of club or organization: Yes    Attends meetings of clubs or organizations: More than 4 times per year    Relationship status: Married  Other Topics Concern  . Not on file  Social History Narrative  . Not on file    Outpatient Encounter Medications as of 11/23/2017  Medication Sig  . Calcium Carbonate-Vitamin D (CALTRATE 600+D) 600-400 MG-UNIT per tablet Take 1 tablet by mouth 2 (two) times daily.    Marland Kitchen ibuprofen (ADVIL,MOTRIN) 200 MG tablet Take 200 mg by mouth every 6 (six) hours as needed.  . Multiple Vitamin (MULTIVITAMIN) tablet Take 1 tablet by mouth daily.    . [DISCONTINUED] docusate sodium (COLACE) 100 MG capsule Take 2 capsules (200 mg total) by mouth daily. (Patient not taking: Reported on 11/23/2017)   No  facility-administered encounter medications on file as of 11/23/2017.     Activities of Daily Living In your present state of health, do you have any difficulty performing the following activities: 11/23/2017  Hearing? N  Vision? N  Difficulty concentrating or making decisions? N  Walking or climbing stairs? N  Dressing or bathing? N  Doing errands, shopping? N  Preparing Food and eating ? N  Using the Toilet? N  In the past six months, have you accidently leaked urine? N  Do you have problems with loss of bowel control? N  Managing your Medications? N  Managing your Finances? N  Housekeeping or managing your Housekeeping? N  Some recent data might be hidden    Patient Care Team: Myrlene Broker, MD as PCP - General (Internal Medicine) Herma Carson, NT as Technician    Assessment:   This is a routine wellness examination for [name]. Physical assessment deferred to PCP.   Exercise Activities and Dietary recommendations Current Exercise Habits: Structured exercise class, Type of exercise: walking(ballroom dancing), Time (Minutes): 40, Frequency (Times/Week): 5, Weekly Exercise (Minutes/Week): 200, Intensity: Mild, Exercise limited by: None identified  Diet (meal preparation, eat out, water intake, caffeinated beverages, dairy products, fruits and vegetables): in general, a "healthy" diet  , well balanced, eats a variety of fruits and vegetables daily, limits salt, fat/cholesterol, sugar,carbohydrates,caffeine, drinks 6-8 glasses of water daily.  Goals      Patient Stated   . pateint goal (pt-stated)     Wants to maintain health and keep dancing       Other   . Patient Stated     Continue to do ball room dancing, stay socially active with friends, be active in church, enjoy life and family.       Fall Risk Fall Risk  12/05/2016 03/19/2015 03/19/2015 05/20/2014 03/11/2013  Falls in the past year? No No No No No    Depression Screen PHQ 2/9 Scores 11/23/2017  12/05/2016 03/19/2015 05/20/2014  PHQ - 2 Score 0 0 0 0     Cognitive Function MMSE - Mini Mental State Exam 11/23/2017 03/19/2015  Not completed: - (No Data)  Orientation to time 5 -  Orientation to Place 5 -  Registration 3 -  Attention/ Calculation 5 -  Recall 2 -  Language- name 2 objects 2 -  Language- repeat 1 -  Language- follow 3 step command 3 -  Language- read & follow direction 1 -  Write a sentence 1 -  Copy design 1 -  Total score 29 -        Immunization History  Administered Date(s) Administered  .  Influenza Split 02/22/2011, 03/05/2012  . Influenza Whole 02/02/2009, 02/19/2010  . Influenza, High Dose Seasonal PF 02/27/2014, 03/01/2016, 03/23/2017  . Influenza,inj,Quad PF,6+ Mos 02/28/2013, 03/03/2015  . Pneumococcal Conjugate-13 03/19/2015  . Pneumococcal Polysaccharide-23 03/11/2013  . Td 09/08/2005   Screening Tests Health Maintenance  Topic Date Due  . TETANUS/TDAP  11/27/2017 (Originally 09/09/2015)  . INFLUENZA VACCINE  12/28/2017  . COLONOSCOPY  03/11/2018  . DEXA SCAN  Completed  . PNA vac Low Risk Adult  Completed       Plan:     I have personally reviewed and noted the following in the patient's chart:   . Medical and social history . Use of alcohol, tobacco or illicit drugs  . Current medications and supplements . Functional ability and status . Nutritional status . Physical activity . Advanced directives . List of other physicians . Vitals . Screenings to include cognitive, depression, and falls . Referrals and appointments  In addition, I have reviewed and discussed with patient certain preventive protocols, quality metrics, and best practice recommendations. A written personalized care plan for preventive services as well as general preventive health recommendations were provided to patient.     Wanda Plump, RN  11/23/2017

## 2017-11-24 NOTE — Progress Notes (Signed)
Medical screening examination/treatment/procedure(s) were performed by non-physician practitioner and as supervising physician I was immediately available for consultation/collaboration. I agree with above. Tiffny Gemmer A Zelina Jimerson, MD 

## 2017-12-12 ENCOUNTER — Telehealth: Payer: Self-pay | Admitting: Internal Medicine

## 2017-12-12 NOTE — Telephone Encounter (Signed)
I tried to call [employer]---even when I choose "provider", it's requiring me to enter patient social security number to continue with the call, I do not have that information---if this person calls back, I will need an extension number to be able to reach them

## 2017-12-12 NOTE — Telephone Encounter (Unsigned)
Copied from CRM [id number]. Topic: Quick Communication - Rx Refill/Question >> Dec 12, 2017  1:08 PM Raquel SarnaHayes, Teresa G wrote: Aggie Cosierrystal [facility]/ [facility]   [phone]  Case # [id number]  Prolia   Needing additional info to continue the review for pt's medication.

## 2017-12-13 NOTE — Telephone Encounter (Signed)
Copied from CRM [id number]. Topic: Inquiry >> Dec 13, 2017 11:14 AM Alexander BergeronBarksdale, Jasmine B wrote: Reason for CRM: [employer] called and wanted to speak w/ a Jasmine Esparza; contact [phone] when possible  There's no way to get thru this recording without knowing a direct extension---

## 2017-12-21 DIAGNOSIS — Z961 Presence of intraocular lens: Secondary | ICD-10-CM | POA: Diagnosis not present

## 2017-12-21 DIAGNOSIS — H31093 Other chorioretinal scars, bilateral: Secondary | ICD-10-CM | POA: Diagnosis not present

## 2017-12-21 DIAGNOSIS — H16223 Keratoconjunctivitis sicca, not specified as Sjogren's, bilateral: Secondary | ICD-10-CM | POA: Diagnosis not present

## 2018-01-16 DIAGNOSIS — Z961 Presence of intraocular lens: Secondary | ICD-10-CM | POA: Diagnosis not present

## 2018-01-16 DIAGNOSIS — H31093 Other chorioretinal scars, bilateral: Secondary | ICD-10-CM | POA: Diagnosis not present

## 2018-01-16 DIAGNOSIS — H16223 Keratoconjunctivitis sicca, not specified as Sjogren's, bilateral: Secondary | ICD-10-CM | POA: Diagnosis not present

## 2018-01-16 DIAGNOSIS — H1851 Endothelial corneal dystrophy: Secondary | ICD-10-CM | POA: Diagnosis not present

## 2018-02-20 ENCOUNTER — Telehealth: Payer: Self-pay

## 2018-02-20 NOTE — Telephone Encounter (Signed)
Copied from CRM [id number]. Topic: General - Other >> Feb 20, 2018  1:31 PM Gerrianne ScalePayne, Angela L wrote: Reason for CRM: pt calling stating that she need a Prolia injection that she received a letter in the mail stating that her insurance will pay for it please call pt at [phone]  Left message advising patient that PA has been approved by her insurance, however, I am still waiting on summary of benefits so that we will know if there is a copay for this injection, I will call patient back after I get that information

## 2018-02-22 ENCOUNTER — Ambulatory Visit (INDEPENDENT_AMBULATORY_CARE_PROVIDER_SITE_OTHER): Payer: Medicare HMO

## 2018-02-22 DIAGNOSIS — Z23 Encounter for immunization: Secondary | ICD-10-CM

## 2018-02-23 ENCOUNTER — Telehealth: Payer: Self-pay

## 2018-02-23 NOTE — Telephone Encounter (Signed)
Copied from CRM [id number]. Topic: General - Other >> Feb 20, 2018  1:31 PM Gerrianne Scale wrote: Reason for CRM: pt calling stating that she need a Prolia injection that she received a letter in the mail stating that her insurance will pay for it please call pt at [phone] >> Feb 21, 2018  9:48 AM Leafy Ro wrote: Pt is aware she must pay 395.00. Pt called her ins company  I have to wait until [employer] notifies me with prior auth approval and summary of benefits with copay ---it would be better if I have this in documentation form, so that I could guarantee patient copay/approval is valid---patient advised, I will call patient back after I have that documentation to schedule her prolia injection

## 2018-03-12 NOTE — Telephone Encounter (Signed)
Pt states she needs a PA to get her Prolia shot. Please call pt to schedule when approved.   [phone]

## 2018-03-13 NOTE — Telephone Encounter (Signed)
Pt calling states that insurance is telling her she can have the Prolia shot.  Pt states she will fax over the letter that she received from them.  Pt is wanting to get this scheduled.

## 2018-03-15 NOTE — Telephone Encounter (Signed)
Patient's insurance co, Monia Pouch was requiring prior auth---I now have auth#[id number], ok to get prolia injection, summary of benefits says estimated $255 copay due for injection---patient has been advised, she has scheduled nurse visit for 03/22/18

## 2018-03-15 NOTE — Telephone Encounter (Signed)
Prior Berkley Harvey is now approved, see other tele encounter

## 2018-03-22 ENCOUNTER — Ambulatory Visit (INDEPENDENT_AMBULATORY_CARE_PROVIDER_SITE_OTHER): Payer: Medicare HMO

## 2018-03-22 DIAGNOSIS — M810 Age-related osteoporosis without current pathological fracture: Secondary | ICD-10-CM | POA: Diagnosis not present

## 2018-03-22 MED ORDER — DENOSUMAB 60 MG/ML ~~LOC~~ SOSY
60.0000 mg | PREFILLED_SYRINGE | Freq: Once | SUBCUTANEOUS | Status: AC
  Administered 2018-03-22: 60 mg via SUBCUTANEOUS

## 2018-03-22 NOTE — Progress Notes (Signed)
Medical treatment/procedure(s) were performed by non-physician practitioner and as supervising physician I was immediately available for consultation/collaboration. I agree with above. Myrlene Broker, MD  Please call patient and schedule visit as she is overdue for physical.

## 2018-05-15 ENCOUNTER — Encounter: Payer: Self-pay | Admitting: Internal Medicine

## 2018-05-15 ENCOUNTER — Ambulatory Visit (INDEPENDENT_AMBULATORY_CARE_PROVIDER_SITE_OTHER)
Admission: RE | Admit: 2018-05-15 | Discharge: 2018-05-15 | Disposition: A | Discharge status: Home / Self Care | Payer: Medicare HMO | Source: Ambulatory Visit | Attending: Internal Medicine | Admitting: Internal Medicine

## 2018-05-15 ENCOUNTER — Ambulatory Visit (INDEPENDENT_AMBULATORY_CARE_PROVIDER_SITE_OTHER): Payer: Medicare HMO | Admitting: Internal Medicine

## 2018-05-15 ENCOUNTER — Other Ambulatory Visit (INDEPENDENT_AMBULATORY_CARE_PROVIDER_SITE_OTHER): Payer: Medicare HMO

## 2018-05-15 VITALS — BP 110/80 | HR 67 | Temp 98.5°F | Ht 61.0 in | Wt 118.0 lb

## 2018-05-15 DIAGNOSIS — R1032 Left lower quadrant pain: Secondary | ICD-10-CM

## 2018-05-15 DIAGNOSIS — K5900 Constipation, unspecified: Secondary | ICD-10-CM | POA: Diagnosis not present

## 2018-05-15 LAB — CBC
HCT: 41.7 % (ref 36.0–46.0)
HEMOGLOBIN: 13.9 g/dL (ref 12.0–15.0)
MCHC: 33.4 g/dL (ref 30.0–36.0)
MCV: 92.9 fl (ref 78.0–100.0)
Platelets: 264 10*3/uL (ref 150.0–400.0)
RBC: 4.49 Mil/uL (ref 3.87–5.11)
RDW: 12.8 % (ref 11.5–15.5)
WBC: 7.7 10*3/uL (ref 4.0–10.5)

## 2018-05-15 LAB — COMPREHENSIVE METABOLIC PANEL
ALT: 15 U/L (ref 0–35)
AST: 17 U/L (ref 0–37)
Albumin: 4.2 g/dL (ref 3.5–5.2)
Alkaline Phosphatase: 53 U/L (ref 39–117)
BUN: 13 mg/dL (ref 6–23)
CO2: 29 mEq/L (ref 19–32)
Calcium: 9.3 mg/dL (ref 8.4–10.5)
Chloride: 106 mEq/L (ref 96–112)
Creatinine, Ser: 0.75 mg/dL (ref 0.40–1.20)
GFR: 79.88 mL/min (ref >60.00)
Glucose, Bld: 100 mg/dL — ABNORMAL HIGH (ref 70–99)
POTASSIUM: 4.1 meq/L (ref 3.5–5.1)
Sodium: 140 mEq/L (ref 135–145)
Total Bilirubin: 0.4 mg/dL (ref 0.2–1.2)
Total Protein: 6.4 g/dL (ref 6.0–8.3)

## 2018-05-15 LAB — LIPASE: LIPASE: 26 U/L (ref 11.0–59.0)

## 2018-05-15 NOTE — Progress Notes (Signed)
   Subjective:    Patient ID: Jasmine Esparza, female    DOB: 03/11/1941, 77 y.o.   MRN: [id number]  HPI The patient is a 77 YO female coming in for constipation and LLQ pain. She remembers being told she had diverticulitis many years ago and is not sure if that is what is going on. She has always been prone to constipation her whole life. She has been using a tea to help with constipation which was working decently until the last several weeks to month or so. She then was trying dulcolax the first or second week which did help her to have a BM but it was small and pieces. She tried the tea again some times and got a rare liquid bowel movement. She is not sure of her last bowel movement but at least several days. She was nauseous this morning and has not eaten any food in the last day or so. She denies passing any gas but cannot say when the last time was. Denies blood in stool. Is drinking liquids and no vomiting.   Review of Systems  Constitutional: Positive for appetite change and fatigue.  HENT: Negative.   Eyes: Negative.   Respiratory: Negative for cough, chest tightness and shortness of breath.   Cardiovascular: Negative for chest pain, palpitations and leg swelling.  Gastrointestinal: Positive for abdominal pain, constipation and nausea. Negative for abdominal distention, anal bleeding, blood in stool, diarrhea and vomiting.  Musculoskeletal: Negative.   Skin: Negative.   Neurological: Negative.   Psychiatric/Behavioral: Negative.       Objective:   Physical Exam Constitutional:      Appearance: She is well-developed.  HENT:     Head: Normocephalic and atraumatic.  Neck:     Musculoskeletal: Normal range of motion.  Cardiovascular:     Rate and Rhythm: Normal rate and regular rhythm.  Pulmonary:     Effort: Pulmonary effort is normal. No respiratory distress.     Breath sounds: Normal breath sounds. No wheezing or rales.  Abdominal:     General: Bowel sounds are decreased.  There is no distension.     Palpations: Abdomen is soft.     Tenderness: There is abdominal tenderness. There is no rebound.     Comments: Pain LLQ no rebound or guarding. BS hypoactive.   Skin:    General: Skin is warm and dry.  Neurological:     Mental Status: She is alert and oriented to person, place, and time.     Coordination: Coordination normal.    Vitals:   05/15/18 1359  BP: 110/80  Pulse: 67  Temp: 98.5 F (36.9 C)  TempSrc: Oral  SpO2: 97%  Weight: 118 lb (53.5 kg)  Height: 5\' 1"  (1.549 m)      Assessment & Plan:

## 2018-05-15 NOTE — Assessment & Plan Note (Signed)
Checking CBC, CMP, lipase and x-ray. Hypoactive BS on exam although exam without rebound or guarding. If no bowel obstruction can use prescription laxatives to help.

## 2018-05-15 NOTE — Patient Instructions (Signed)
We are checking an x-ray of the bowels today and blood work. We will call you back about the results and go from there.

## 2018-05-17 ENCOUNTER — Other Ambulatory Visit: Payer: Self-pay | Admitting: Internal Medicine

## 2018-05-17 MED ORDER — MAGNESIUM CITRATE PO SOLN: 1.0000 | Freq: Once | ORAL | 1 refills | Status: AC

## 2018-10-11 ENCOUNTER — Ambulatory Visit (INDEPENDENT_AMBULATORY_CARE_PROVIDER_SITE_OTHER): Payer: Medicare HMO

## 2018-10-11 DIAGNOSIS — M810 Age-related osteoporosis without current pathological fracture: Secondary | ICD-10-CM

## 2018-10-11 MED ORDER — DENOSUMAB 60 MG/ML ~~LOC~~ SOSY
60.0000 mg | PREFILLED_SYRINGE | Freq: Once | SUBCUTANEOUS | Status: AC
  Administered 2018-10-11: 60 mg via SUBCUTANEOUS

## 2018-10-11 NOTE — Progress Notes (Signed)
Medical treatment/procedure(s) were performed by non-physician practitioner and as supervising physician I was immediately available for consultation/collaboration. I agree with above. Laela Deviney A Santanna Olenik, MD  

## 2019-01-04 ENCOUNTER — Telehealth: Payer: Self-pay | Admitting: Internal Medicine

## 2019-01-04 NOTE — Telephone Encounter (Signed)
Attempted to call patient to schedule AWV, but phone busy. Will try to call back at later time. SF

## 2019-01-28 DIAGNOSIS — H1851 Endothelial corneal dystrophy: Secondary | ICD-10-CM | POA: Diagnosis not present

## 2019-01-28 DIAGNOSIS — H16223 Keratoconjunctivitis sicca, not specified as Sjogren's, bilateral: Secondary | ICD-10-CM | POA: Diagnosis not present

## 2019-01-28 DIAGNOSIS — H31093 Other chorioretinal scars, bilateral: Secondary | ICD-10-CM | POA: Diagnosis not present

## 2019-01-28 DIAGNOSIS — Z961 Presence of intraocular lens: Secondary | ICD-10-CM | POA: Diagnosis not present

## 2019-03-01 DIAGNOSIS — R69 Illness, unspecified: Secondary | ICD-10-CM | POA: Diagnosis not present

## 2019-03-25 ENCOUNTER — Telehealth: Payer: Self-pay | Admitting: Internal Medicine

## 2019-03-25 NOTE — Telephone Encounter (Signed)
Patient has been submitted for insurance approval for Prolia injection through the Prolia Portal.  °Waiting on benefits. °Will contact patient once these have been received. °

## 2019-06-16 ENCOUNTER — Ambulatory Visit: Payer: Medicare Other | Attending: Internal Medicine

## 2019-06-16 DIAGNOSIS — Z23 Encounter for immunization: Secondary | ICD-10-CM

## 2019-06-16 NOTE — Progress Notes (Unsigned)
   Covid-19 Vaccination Clinic  Name:  Jasmine Esparza    MRN: [id number] DOB: [birth year]  06/16/2019  Jasmine Esparza was observed post Covid-19 immunization for {COVID Vaccine Observation Times:23551} without incidence. She was provided with Vaccine Information Sheet and instruction to access the V-Safe system.   Jasmine Esparza was instructed to call 911 with any severe reactions post vaccine: Marland Kitchen Difficulty breathing  . Swelling of your face and throat  . A fast heartbeat  . A bad rash all over your body  . Dizziness and weakness

## 2019-07-04 ENCOUNTER — Ambulatory Visit: Payer: Medicare HMO | Attending: Internal Medicine

## 2019-07-04 DIAGNOSIS — Z23 Encounter for immunization: Secondary | ICD-10-CM | POA: Insufficient documentation

## 2019-07-04 NOTE — Progress Notes (Signed)
   Covid-19 Vaccination Clinic  Name:  IMAAN PADGETT    MRN: [id number] DOB: [birth year]  07/04/2019  Ms. Aguinaga was observed post Covid-19 immunization for 15 minutes without incidence. She was provided with Vaccine Information Sheet and instruction to access the V-Safe system.   Ms. Paulsen was instructed to call 911 with any severe reactions post vaccine: Marland Kitchen Difficulty breathing  . Swelling of your face and throat  . A fast heartbeat  . A bad rash all over your body  . Dizziness and weakness    Immunizations Administered    Name Date Dose VIS Date Route   Pfizer COVID-19 Vaccine 07/04/2019  1:29 PM 0.3 mL 05/10/2019 Intramuscular   Manufacturer: [employer], [employer]   Lot: [id number]   NDC: [address]-3

## 2019-07-09 DIAGNOSIS — H31093 Other chorioretinal scars, bilateral: Secondary | ICD-10-CM | POA: Diagnosis not present

## 2019-07-09 DIAGNOSIS — H18513 Endothelial corneal dystrophy, bilateral: Secondary | ICD-10-CM | POA: Diagnosis not present

## 2019-07-09 DIAGNOSIS — H21233 Degeneration of iris (pigmentary), bilateral: Secondary | ICD-10-CM | POA: Diagnosis not present

## 2019-07-09 DIAGNOSIS — H16223 Keratoconjunctivitis sicca, not specified as Sjogren's, bilateral: Secondary | ICD-10-CM | POA: Diagnosis not present

## 2019-07-09 DIAGNOSIS — Z961 Presence of intraocular lens: Secondary | ICD-10-CM | POA: Diagnosis not present

## 2019-07-23 ENCOUNTER — Telehealth: Payer: Self-pay | Admitting: Internal Medicine

## 2019-07-23 NOTE — Telephone Encounter (Signed)
Insurance has been submitted and verified for Prolia. Patient is responsible for a $255 copay. Due anytime. Tried to call patient to inform. No answer and unable to leave a message.   Okay to schedule... Visit Note: Prolia ($255 copay - okay to give per Colon Branch) Visit Type: Nurse Provider: Nurse

## 2019-10-15 DIAGNOSIS — Z822 Family history of deafness and hearing loss: Secondary | ICD-10-CM | POA: Diagnosis not present

## 2019-10-15 DIAGNOSIS — H903 Sensorineural hearing loss, bilateral: Secondary | ICD-10-CM | POA: Diagnosis not present

## 2019-12-18 ENCOUNTER — Ambulatory Visit (INDEPENDENT_AMBULATORY_CARE_PROVIDER_SITE_OTHER): Payer: Medicare HMO | Admitting: *Deleted

## 2019-12-18 ENCOUNTER — Other Ambulatory Visit: Payer: Self-pay

## 2019-12-18 DIAGNOSIS — M810 Age-related osteoporosis without current pathological fracture: Secondary | ICD-10-CM | POA: Diagnosis not present

## 2019-12-18 MED ORDER — DENOSUMAB 60 MG/ML ~~LOC~~ SOSY
60.0000 mg | PREFILLED_SYRINGE | Freq: Once | SUBCUTANEOUS | Status: AC
  Administered 2019-12-18: 60 mg via SUBCUTANEOUS

## 2019-12-18 NOTE — Progress Notes (Signed)
Pls cosign for Prolia inj in absence of PCP.../lmb  

## 2020-01-28 DIAGNOSIS — H31093 Other chorioretinal scars, bilateral: Secondary | ICD-10-CM | POA: Diagnosis not present

## 2020-01-28 DIAGNOSIS — H21233 Degeneration of iris (pigmentary), bilateral: Secondary | ICD-10-CM | POA: Diagnosis not present

## 2020-01-28 DIAGNOSIS — H18513 Endothelial corneal dystrophy, bilateral: Secondary | ICD-10-CM | POA: Diagnosis not present

## 2020-01-28 DIAGNOSIS — Z961 Presence of intraocular lens: Secondary | ICD-10-CM | POA: Diagnosis not present

## 2020-01-28 DIAGNOSIS — H16223 Keratoconjunctivitis sicca, not specified as Sjogren's, bilateral: Secondary | ICD-10-CM | POA: Diagnosis not present

## 2020-01-28 DIAGNOSIS — Z9889 Other specified postprocedural states: Secondary | ICD-10-CM | POA: Diagnosis not present

## 2020-01-28 DIAGNOSIS — H26493 Other secondary cataract, bilateral: Secondary | ICD-10-CM | POA: Diagnosis not present

## 2020-02-13 DIAGNOSIS — R69 Illness, unspecified: Secondary | ICD-10-CM | POA: Diagnosis not present

## 2020-03-04 ENCOUNTER — Ambulatory Visit: Payer: Medicare HMO

## 2020-03-16 ENCOUNTER — Ambulatory Visit (INDEPENDENT_AMBULATORY_CARE_PROVIDER_SITE_OTHER): Payer: Medicare HMO | Admitting: *Deleted

## 2020-03-16 ENCOUNTER — Other Ambulatory Visit: Payer: Self-pay

## 2020-03-16 DIAGNOSIS — Z23 Encounter for immunization: Secondary | ICD-10-CM | POA: Diagnosis not present

## 2020-08-05 ENCOUNTER — Telehealth: Payer: Self-pay | Admitting: Internal Medicine

## 2020-08-05 DIAGNOSIS — E2839 Other primary ovarian failure: Secondary | ICD-10-CM

## 2020-08-05 NOTE — Telephone Encounter (Signed)
Patient requesting order for bone density to be done at [facility]

## 2020-08-06 NOTE — Telephone Encounter (Signed)
Patient scheduled for Prolia 3/14 She would like to verify benefits and authorization before appointment

## 2020-08-07 ENCOUNTER — Telehealth: Payer: Self-pay | Admitting: Internal Medicine

## 2020-08-07 NOTE — Telephone Encounter (Signed)
Patient called and was wondering if her insurance was going to pay for anything for her Prolia. She is scheduled for 08/10/20. Please advise. She can be reached at [phone]

## 2020-08-07 NOTE — Telephone Encounter (Signed)
Ok to place orders

## 2020-08-07 NOTE — Telephone Encounter (Signed)
Ordered

## 2020-08-10 ENCOUNTER — Ambulatory Visit: Payer: Medicare HMO

## 2020-08-10 NOTE — Telephone Encounter (Signed)
Patient scheduled for Prolia today. She wnts to know her benefit

## 2020-08-12 NOTE — Telephone Encounter (Signed)
Prolia authorized Authorization #[id number] From: 3.14.22  To: 3.14.23  $275 copayment  Patient scheduled 3.17.22

## 2020-08-13 ENCOUNTER — Other Ambulatory Visit: Payer: Self-pay

## 2020-08-13 ENCOUNTER — Ambulatory Visit (INDEPENDENT_AMBULATORY_CARE_PROVIDER_SITE_OTHER): Payer: Medicare HMO | Admitting: *Deleted

## 2020-08-13 DIAGNOSIS — M810 Age-related osteoporosis without current pathological fracture: Secondary | ICD-10-CM | POA: Diagnosis not present

## 2020-08-13 MED ORDER — DENOSUMAB 60 MG/ML ~~LOC~~ SOSY
60.0000 mg | PREFILLED_SYRINGE | Freq: Once | SUBCUTANEOUS | Status: AC
  Administered 2020-08-13: 60 mg via SUBCUTANEOUS

## 2020-08-13 NOTE — Progress Notes (Signed)
Pls cosign for prolia inj../lmb 

## 2020-08-20 ENCOUNTER — Encounter: Payer: Self-pay | Admitting: Internal Medicine

## 2020-08-20 DIAGNOSIS — M85852 Other specified disorders of bone density and structure, left thigh: Secondary | ICD-10-CM | POA: Diagnosis not present

## 2020-08-20 DIAGNOSIS — M85851 Other specified disorders of bone density and structure, right thigh: Secondary | ICD-10-CM | POA: Diagnosis not present

## 2020-08-20 DIAGNOSIS — M810 Age-related osteoporosis without current pathological fracture: Secondary | ICD-10-CM | POA: Diagnosis not present

## 2021-04-01 DIAGNOSIS — H18513 Endothelial corneal dystrophy, bilateral: Secondary | ICD-10-CM | POA: Diagnosis not present

## 2021-04-01 DIAGNOSIS — Z961 Presence of intraocular lens: Secondary | ICD-10-CM | POA: Diagnosis not present

## 2021-04-01 DIAGNOSIS — H26493 Other secondary cataract, bilateral: Secondary | ICD-10-CM | POA: Diagnosis not present

## 2021-04-01 DIAGNOSIS — H31093 Other chorioretinal scars, bilateral: Secondary | ICD-10-CM | POA: Diagnosis not present

## 2021-04-01 DIAGNOSIS — H16223 Keratoconjunctivitis sicca, not specified as Sjogren's, bilateral: Secondary | ICD-10-CM | POA: Diagnosis not present

## 2021-04-01 DIAGNOSIS — H21233 Degeneration of iris (pigmentary), bilateral: Secondary | ICD-10-CM | POA: Diagnosis not present

## 2021-05-07 ENCOUNTER — Telehealth: Payer: Self-pay | Admitting: Internal Medicine

## 2021-05-07 NOTE — Telephone Encounter (Signed)
Patient calling in to schedule Prolia injection  Patient was asking would insurance cover & how much would her copay be  Advised patient last amount paid 07/2020 was around $275  Patient would like someone to verify benefits & copay amount before nurse visit 05/14/21

## 2021-05-09 NOTE — Telephone Encounter (Signed)
Prolia VOB initiated via [email or web address]  Last OV:  Next OV:  Last Prolia inj:  Next Prolia inj DUE:   

## 2021-05-10 NOTE — Telephone Encounter (Signed)
Pt ready for scheduling on or after 02/14/21  Out-of-pocket cost due at time of visit: $295  Primary: Aetna Medicare Prolia co-insurance: 20% (approximately $270) Admin fee co-insurance: 20% (approximately $25)  Secondary: n/a Prolia co-insurance:  Admin fee co-insurance:   Deductible: does not applt  Prior Auth: APPROVED PA# [id number] Valid: 08/10/20-08/10/21  ** This summary of benefits is an estimation of the patient's out-of-pocket cost. Exact cost may vary based on individual plan coverage.

## 2021-05-14 ENCOUNTER — Ambulatory Visit: Payer: Medicare HMO

## 2021-05-17 ENCOUNTER — Ambulatory Visit (INDEPENDENT_AMBULATORY_CARE_PROVIDER_SITE_OTHER): Payer: Medicare HMO

## 2021-05-17 ENCOUNTER — Other Ambulatory Visit: Payer: Self-pay

## 2021-05-17 DIAGNOSIS — M810 Age-related osteoporosis without current pathological fracture: Secondary | ICD-10-CM

## 2021-05-17 MED ORDER — DENOSUMAB 60 MG/ML ~~LOC~~ SOSY
60.0000 mg | PREFILLED_SYRINGE | Freq: Once | SUBCUTANEOUS | Status: AC
  Administered 2021-05-17: 11:00:00 60 mg via SUBCUTANEOUS

## 2021-05-17 NOTE — Progress Notes (Signed)
Pt given Prolia injection w/o any complications. 

## 2021-07-15 NOTE — Telephone Encounter (Signed)
Last Prolia inj 05/17/21 ?Next Prolia inj due 11/16/21 ?

## 2021-08-31 ENCOUNTER — Ambulatory Visit (INDEPENDENT_AMBULATORY_CARE_PROVIDER_SITE_OTHER): Payer: Medicare HMO | Admitting: Internal Medicine

## 2021-08-31 ENCOUNTER — Encounter: Payer: Self-pay | Admitting: Internal Medicine

## 2021-08-31 VITALS — BP 114/70 | HR 81 | Resp 18 | Ht 61.0 in | Wt 121.2 lb

## 2021-08-31 DIAGNOSIS — Z0000 Encounter for general adult medical examination without abnormal findings: Secondary | ICD-10-CM

## 2021-08-31 DIAGNOSIS — M810 Age-related osteoporosis without current pathological fracture: Secondary | ICD-10-CM

## 2021-08-31 DIAGNOSIS — E782 Mixed hyperlipidemia: Secondary | ICD-10-CM | POA: Diagnosis not present

## 2021-08-31 LAB — LIPID PANEL
Cholesterol: 220 mg/dL — ABNORMAL HIGH (ref 0–200)
HDL: 74.9 mg/dL (ref >39.00)
LDL Cholesterol: 124 mg/dL — ABNORMAL HIGH (ref 0–99)
NonHDL: 144.79
Total CHOL/HDL Ratio: 3
Triglycerides: 102 mg/dL (ref 0.0–149.0)
VLDL: 20.4 mg/dL (ref 0.0–40.0)

## 2021-08-31 LAB — COMPREHENSIVE METABOLIC PANEL
ALT: 16 U/L (ref 0–35)
AST: 22 U/L (ref 0–37)
Albumin: 4.5 g/dL (ref 3.5–5.2)
Alkaline Phosphatase: 47 U/L (ref 39–117)
BUN: 17 mg/dL (ref 6–23)
CO2: 32 mEq/L (ref 19–32)
Calcium: 9.4 mg/dL (ref 8.4–10.5)
Chloride: 104 mEq/L (ref 96–112)
Creatinine, Ser: 0.82 mg/dL (ref 0.40–1.20)
GFR: 68.15 mL/min (ref >60.00)
Glucose, Bld: 93 mg/dL (ref 70–99)
Potassium: 3.8 mEq/L (ref 3.5–5.1)
Sodium: 143 mEq/L (ref 135–145)
Total Bilirubin: 0.6 mg/dL (ref 0.2–1.2)
Total Protein: 6.9 g/dL (ref 6.0–8.3)

## 2021-08-31 LAB — CBC
HCT: 44.3 % (ref 36.0–46.0)
Hemoglobin: 14.7 g/dL (ref 12.0–15.0)
MCHC: 33.1 g/dL (ref 30.0–36.0)
MCV: 92.9 fl (ref 78.0–100.0)
Platelets: 273 10*3/uL (ref 150.0–400.0)
RBC: 4.77 Mil/uL (ref 3.87–5.11)
RDW: 13.6 % (ref 11.5–15.5)
WBC: 5.1 10*3/uL (ref 4.0–10.5)

## 2021-08-31 LAB — VITAMIN D 25 HYDROXY (VIT D DEFICIENCY, FRACTURES): VITD: 35.54 ng/mL (ref 30.00–100.00)

## 2021-08-31 NOTE — Progress Notes (Signed)
? ?Subjective:  ? ?Patient ID: Jasmine Esparza, female    DOB: 06/16/1940, 81 y.o.   MRN: [id number] ? ?HPI ?Here for medicare wellness and physical, no new complaints. Please see A/P for status and treatment of chronic medical problems.  ? ?Diet: heart healthy ?Physical activity: sedentary, ballroom dancing ?Depression/mood screen: negative ?Hearing: intact to whispered voice, bilateral hearing aids ?Visual acuity: grossly normal, performs annual eye exam  ?ADLs: capable ?Fall risk: none ?Home safety: good ?Cognitive evaluation: intact to orientation, naming, recall and repetition ?EOL planning: adv directives discussed ? ?Flowsheet Row Office Visit from 08/31/2021 in [facility] [place]  ?PHQ-2 Total Score 0  ? ?  ?  ? ? ?  03/19/2015  ? 10:41 AM 03/19/2015  ? 10:51 AM 12/05/2016  ?  9:09 AM 05/15/2018  ?  2:03 PM 08/31/2021  ?  9:08 AM  ?Fall Risk  ?Falls in the past year? No No No 0 1  ?Was there an injury with Fall?     0  ?Fall Risk Category Calculator     2  ?Fall Risk Category     Moderate  ?Patient Fall Risk Level     Moderate fall risk  ? ? ?I have personally reviewed and have noted ?1. The patient's medical and social history - reviewed today no changes ?2. Their use of alcohol, tobacco or illicit drugs ?3. Their current medications and supplements ?4. The patient's functional ability including ADL's, fall risks, home safety risks and hearing or visual impairment. ?5. Diet and physical activities ?6. Evidence for depression or mood disorders ?7. Care team reviewed and updated ?8.  The patient is not on an opioid pain medication. ? ?Patient Care Team: ?Myrlene Broker, MD as PCP - General (Internal Medicine) ?Herma Carson, NT as Technician ?Past Medical History:  ?Diagnosis Date  ? ALLERGIC RHINITIS   ? ECZEMA   ? GOITER, MULTINODULAR   ? s/p I-131 ablation 10/2009  ? Hypercalcemia   ? HYPERLIPIDEMIA   ? OSTEOPOROSIS   ? ?Past Surgical History:  ?Procedure Laterality Date  ? CATARACT  EXTRACTION W/ INTRAOCULAR LENS IMPLANT Bilateral 2013  ? ?Family History  ?Problem Relation Age of Onset  ? Stroke Mother   ? Arthritis Mother   ? Hypertension Mother   ? Prostate cancer Father   ? Arthritis Father   ? Hypertension Sister   ? Osteoporosis Sister   ? Hypertension Brother   ? Hyperlipidemia Brother   ? ?Review of Systems  ?Constitutional: Negative.   ?HENT: Negative.    ?Eyes: Negative.   ?Respiratory:  Negative for cough, chest tightness and shortness of breath.   ?Cardiovascular:  Negative for chest pain, palpitations and leg swelling.  ?Gastrointestinal:  Negative for abdominal distention, abdominal pain, constipation, diarrhea, nausea and vomiting.  ?Musculoskeletal: Negative.   ?Skin: Negative.   ?Neurological: Negative.   ?Psychiatric/Behavioral: Negative.    ? ?Objective:  ?Physical Exam ?Constitutional:   ?   Appearance: She is well-developed.  ?HENT:  ?   Head: Normocephalic and atraumatic.  ?Cardiovascular:  ?   Rate and Rhythm: Normal rate and regular rhythm.  ?Pulmonary:  ?   Effort: Pulmonary effort is normal. No respiratory distress.  ?   Breath sounds: Normal breath sounds. No wheezing or rales.  ?Abdominal:  ?   General: Bowel sounds are normal. There is no distension.  ?   Palpations: Abdomen is soft.  ?   Tenderness: There is no abdominal tenderness. There is  no rebound.  ?Musculoskeletal:  ?   Cervical back: Normal range of motion.  ?Skin: ?   General: Skin is warm and dry.  ?Neurological:  ?   Mental Status: She is alert and oriented to person, place, and time.  ?   Coordination: Coordination normal.  ? ? ?Vitals:  ? 08/31/21 0904  ?BP: 114/70  ?Pulse: 81  ?Resp: 18  ?SpO2: 94%  ?Weight: 121 lb 3.2 oz (55 kg)  ?Height: 5\' 1"  (1.549 m)  ? ?This visit occurred during the SARS-CoV-2 public health emergency.  Safety protocols were in place, including screening questions prior to the visit, additional usage of staff PPE, and extensive cleaning of exam room while observing appropriate  contact time as indicated for disinfecting solutions.  ? ?Assessment & Plan:  ? ?

## 2021-08-31 NOTE — Assessment & Plan Note (Signed)
Flu shot yearly. Covid-19 counseled. Pneumonia complete. Shingrix counseled to get at pharmacy. Tetanus due declines. Colonoscopy aged out future. Mammogram gets yearly, pap smear aged out and dexa due 2024. Counseled about sun safety and mole surveillance. Counseled about the dangers of distracted driving. Given 10 year screening recommendations.  ? ?

## 2021-08-31 NOTE — Assessment & Plan Note (Signed)
Still getting prolia but no recent labs. Checking CMP and vitamin D. Adjust as needed. Last DEXA 2022 improved bone density. Will plan to continue prolia twice yearly. DEXA due 2024. ?

## 2021-08-31 NOTE — Patient Instructions (Signed)
We are checking the labs today.  

## 2021-08-31 NOTE — Assessment & Plan Note (Signed)
Checking lipid panel and adjust as needed. Not on medication currently and goal LDL <130 ?

## 2021-09-30 NOTE — Telephone Encounter (Signed)
Prolia VOB initiated via [email or web address] ? ?Last Prolia inj 05/17/21 ?Next Prolia inj due 11/16/21 ?

## 2021-10-12 NOTE — Telephone Encounter (Signed)
Prior auth required for PROLIA  PA PROCESS DETAILS: Precertification is required. Call [phone] or complete the Precertification form available at [email or web address] 

## 2021-10-29 ENCOUNTER — Encounter: Payer: Self-pay | Admitting: Family Medicine

## 2021-10-29 ENCOUNTER — Ambulatory Visit (HOSPITAL_COMMUNITY): Payer: Self-pay

## 2021-10-29 ENCOUNTER — Other Ambulatory Visit: Payer: Medicare HMO

## 2021-10-29 ENCOUNTER — Telehealth: Payer: Self-pay

## 2021-10-29 ENCOUNTER — Ambulatory Visit (INDEPENDENT_AMBULATORY_CARE_PROVIDER_SITE_OTHER): Payer: Medicare HMO | Admitting: Family Medicine

## 2021-10-29 VITALS — BP 126/72 | HR 70 | Temp 97.8°F | Ht 61.0 in | Wt 117.0 lb

## 2021-10-29 DIAGNOSIS — R10811 Right upper quadrant abdominal tenderness: Secondary | ICD-10-CM

## 2021-10-29 DIAGNOSIS — K5900 Constipation, unspecified: Secondary | ICD-10-CM | POA: Diagnosis not present

## 2021-10-29 DIAGNOSIS — R1031 Right lower quadrant pain: Secondary | ICD-10-CM

## 2021-10-29 DIAGNOSIS — R140 Abdominal distension (gaseous): Secondary | ICD-10-CM

## 2021-10-29 NOTE — Addendum Note (Signed)
Addended by: Catalia Massett L on: 10/29/2021 04:59 PM   Modules accepted: Orders  

## 2021-10-29 NOTE — Progress Notes (Signed)
Subjective:     Patient ID: Jasmine Esparza, female    DOB: 1941-02-06, 81 y.o.   MRN: [id number]  Chief Complaint  Patient presents with   Constipation    RLQ pain that has been going on for a week. No appetite.  Last bowel movement was approximately Wednesday even though Jasmine Esparza has been taking laxatives.     Constipation  Patient is in today for a one week history of RLQ pain, decreased appetite and bloating.  Pain is not worse with movement or after eating.  States Jasmine Esparza had a bowel movement 2 days ago.  It was normal for Jasmine Esparza and no blood.  States Jasmine Esparza is taking Dulcolax several times each week. States Jasmine Esparza has increased Jasmine Esparza fiber intake but it has not helped.   States Jasmine Esparza normal bowel regimen is to have a bowel movement every 3 to 4 days.  States Jasmine Esparza has always had to take laxatives as long as Jasmine Esparza can remember to have a bowel movement.   Denies fever, chills, dizziness, chest pain, palpitations, shortness of breath,  N/V/D, urinary symptoms.      Health Maintenance Due  Topic Date Due   Hepatitis C Screening  Never done    Past Medical History:  Diagnosis Date   ALLERGIC RHINITIS    ECZEMA    GOITER, MULTINODULAR    s/p I-131 ablation 10/2009   Hypercalcemia    HYPERLIPIDEMIA    OSTEOPOROSIS     Past Surgical History:  Procedure Laterality Date   CATARACT EXTRACTION W/ INTRAOCULAR LENS IMPLANT Bilateral 2013    Family History  Problem Relation Age of Onset   Stroke Mother    Arthritis Mother    Hypertension Mother    Prostate cancer Father    Arthritis Father    Hypertension Sister    Osteoporosis Sister    Hypertension Brother    Hyperlipidemia Brother     Social History   Socioeconomic History   Marital status: Married    Spouse name: Not on file   Number of children: 3   Years of education: Not on file   Highest education level: Not on file  Occupational History   Not on file  Tobacco Use   Smoking status: Never   Smokeless tobacco: Never    Tobacco comments:    Married, lives with spouse. retired  Building services engineerVaping Use   Vaping Use: Never used  Substance and Sexual Activity   Alcohol use: No   Drug use: No   Sexual activity: Not Currently  Other Topics Concern   Not on file  Social History Narrative   Not on file   Social Determinants of Health   Financial Resource Strain: Not on file  Food Insecurity: Not on file  Transportation Needs: Not on file  Physical Activity: Not on file  Stress: Not on file  Social Connections: Not on file  Intimate Partner Violence: Not on file    Outpatient Medications Prior to Visit  Medication Sig Dispense Refill   ibuprofen (ADVIL,MOTRIN) 200 MG tablet Take 200 mg by mouth every 6 (six) hours as needed.     Calcium Carbonate-Vitamin D 600-400 MG-UNIT tablet Take 1 tablet by mouth 2 (two) times daily.   (Patient not taking: Reported on 10/29/2021)     Multiple Vitamin (MULTIVITAMIN) tablet Take 1 tablet by mouth daily.   (Patient not taking: Reported on 10/29/2021)     No facility-administered medications prior to visit.    Allergies  Allergen Reactions  Codeine     REACTION: nausea    Review of Systems  Gastrointestinal:  Positive for constipation.      Objective:    Physical Exam Constitutional:      General: Jasmine Esparza is not in acute distress.    Appearance: Jasmine Esparza is not ill-appearing.  HENT:     Mouth/Throat:     Mouth: Mucous membranes are moist.  Eyes:     Conjunctiva/sclera: Conjunctivae normal.     Pupils: Pupils are equal, round, and reactive to light.  Cardiovascular:     Rate and Rhythm: Normal rate and regular rhythm.     Pulses: Normal pulses.  Pulmonary:     Effort: Pulmonary effort is normal.     Breath sounds: Normal breath sounds.  Abdominal:     General: There is no distension.     Palpations: Abdomen is soft.     Tenderness: There is abdominal tenderness in the right upper quadrant. There is no right CVA tenderness, left CVA tenderness, guarding or rebound.  Negative signs include Murphy's sign, McBurney's sign and psoas sign.  Skin:    General: Skin is warm and dry.  Neurological:     Mental Status: Jasmine Esparza is alert and oriented to person, place, and time.     Cranial Nerves: Cranial nerves 2-12 are intact.     Sensory: Sensation is intact.     Motor: Motor function is intact.     Gait: Gait is intact.    BP 126/72 (BP Location: Left Arm, Patient Position: Sitting, Cuff Size: Large)   Pulse 70   Temp 97.8 F (36.6 C) (Temporal)   Ht 5\' 1"  (1.549 m)   Wt 117 lb (53.1 kg)   SpO2 98%   BMI 22.11 kg/m  Wt Readings from Last 3 Encounters:  10/29/21 117 lb (53.1 kg)  08/31/21 121 lb 3.2 oz (55 kg)  05/15/18 118 lb (53.5 kg)       Assessment & Plan:   Problem List Items Addressed This Visit       Other   Constipation    Jasmine Esparza is not in any acute distress.  No sign of an infectious process or obstruction.  Jasmine Esparza has apparently been taking Dulcolax for years.  Jasmine Esparza normal bowel regimen is every 3 to 4 days and Jasmine Esparza does not seem to be outside of Jasmine Esparza normal regimen.  Discussed cutting back on the Dulcolax and trying over-the-counter MiraLAX.  Discussed titrating it.  We also discussed getting adequate fiber in Jasmine Esparza diet, drinking plenty of water and being active to help promote motility.  Abdominal ultrasound ordered.       Relevant Orders   05/17/18 Abdomen Complete   Right upper quadrant abdominal tenderness without rebound tenderness    Mild tenderness to palpation without rebound or guarding or referred pain.  Labs ordered including lipase and abdominal ultrasound ordered.       Relevant Orders   US Abdomen Complete   CBC with Differential/Platelet   Comprehensive metabolic panel   Lipase   RLQ abdominal pain - Primary    Jasmine Esparza is not in any acute distress.  No red flag symptoms.  Jasmine Esparza does not appear to have appendicitis at this time.  Labs ordered as well as abdominal ultrasound.  We discussed keeping a close eye on Jasmine Esparza symptoms and if Jasmine Esparza  develops any fever, severe pain or vomiting then Jasmine Esparza will go to the emergency department for evaluation.       Relevant Orders  US Abdomen Complete   CBC with Differential/Platelet   Comprehensive metabolic panel   Other Visit Diagnoses     Bloating       Relevant Orders   US Abdomen Complete       I am having [name]. Claros maintain Jasmine Esparza Calcium Carbonate-Vitamin D, multivitamin, and ibuprofen.  No orders of the defined types were placed in this encounter.

## 2021-10-29 NOTE — Patient Instructions (Signed)
  Please go to [facility] lab on [facility] for your blood work today.   I have scheduled an abdominal ultrasound and you will receive a call from [facility] to schedule this.  Try 1 cap of MiraLAX and a 6 or 8 ounce glass of fluid.  You can do this daily.  If you develop diarrhea then you can reduce the amount you are taking.  If you get much worse over the weekend (develop fever, severe abdominal pain, vomiting, etc) then you will need to go to the emergency department.   Constipation, Adult Constipation is when a person has fewer than three bowel movements in a week, has difficulty having a bowel movement, or has stools (feces) that are dry, hard, or larger than normal. Constipation may be caused by an underlying condition. It may become worse with age if a person takes certain medicines and does not take in enough fluids. Follow these instructions at home: Eating and drinking  Eat foods that have a lot of fiber, such as beans, whole grains, and fresh fruits and vegetables. Limit foods that are low in fiber and high in fat and processed sugars, such as fried or sweet foods. These include french fries, hamburgers, cookies, candies, and soda. Drink enough fluid to keep your urine pale yellow. General instructions Exercise regularly or as told by your health care provider. Try to do 150 minutes of moderate exercise each week. Use the bathroom when you have the urge to go. Do not hold it in. Take over-the-counter and prescription medicines only as told by your health care provider. This includes any fiber supplements. During bowel movements: Practice deep breathing while relaxing the lower abdomen. Practice pelvic floor relaxation. Watch your condition for any changes. Let your health care provider know about them. Keep all follow-up visits as told by your health care provider. This is important. Contact a health care provider if: You have pain that gets worse. You have a fever. You  do not have a bowel movement after 4 days. You vomit. You are not hungry or you lose weight. You are bleeding from the opening between the buttocks (anus). You have thin, pencil-like stools. Get help right away if: You have a fever and your symptoms suddenly get worse. You leak stool or have blood in your stool. Your abdomen is bloated. You have severe pain in your abdomen. You feel dizzy or you faint. Summary Constipation is when a person has fewer than three bowel movements in a week, has difficulty having a bowel movement, or has stools (feces) that are dry, hard, or larger than normal. Eat foods that have a lot of fiber, such as beans, whole grains, and fresh fruits and vegetables. Drink enough fluid to keep your urine pale yellow. Take over-the-counter and prescription medicines only as told by your health care provider. This includes any fiber supplements. This information is not intended to replace advice given to you by your health care provider. Make sure you discuss any questions you have with your health care provider. Document Revised: 04/03/2019 Document Reviewed: 04/03/2019 Elsevier Patient Education  2023 [employer].

## 2021-10-29 NOTE — Assessment & Plan Note (Signed)
Mild tenderness to palpation without rebound or guarding or referred pain.  Labs ordered including lipase and abdominal ultrasound ordered.

## 2021-10-29 NOTE — Addendum Note (Signed)
Addended by: Blenda Mounts on: 10/29/2021 05:00 PM   Modules accepted: Orders

## 2021-10-29 NOTE — Assessment & Plan Note (Signed)
She is not in any acute distress.  No sign of an infectious process or obstruction.  She has apparently been taking Dulcolax for years.  Her normal bowel regimen is every 3 to 4 days and she does not seem to be outside of her normal regimen.  Discussed cutting back on the Dulcolax and trying over-the-counter MiraLAX.  Discussed titrating it.  We also discussed getting adequate fiber in her diet, drinking plenty of water and being active to help promote motility.  Abdominal ultrasound ordered.

## 2021-10-29 NOTE — Telephone Encounter (Signed)
Please advise PCP is out of office  Spoke with the patient and she is having rib pain but she also stated that she has been having issues with her bowel movements. She stated that she is unable to have a bowel movement without taking Dulcolax(Dr. Okey Dupre is aware), she was advised by Dr. Okey Dupre that she can increase her vegetable and fruit intake. Since then patient has had doubled up on her Dulcolax and given herself a enema, she is no eating. When asked she has only eaten 3 saltine crackers and ginger ale.

## 2021-10-29 NOTE — Assessment & Plan Note (Addendum)
She is not in any acute distress.  No red flag symptoms.  She does not appear to have appendicitis at this time.  Labs ordered as well as abdominal ultrasound.  We discussed keeping a close eye on her symptoms and if she develops any fever, severe pain or vomiting then she will go to the emergency department for evaluation.

## 2021-10-29 NOTE — Addendum Note (Signed)
Addended by: Azzie Almas on: 10/29/2021 04:59 PM   Modules accepted: Orders

## 2021-10-29 NOTE — Telephone Encounter (Signed)
Pt has been scheduled for a in office visit with Hetty Blend, NP at 4:20 pm today. Pt is aware of appt date and time.

## 2021-10-29 NOTE — Telephone Encounter (Signed)
Pt states she is having rt sided pain near her ribs... Pt denies SOB. Pt states this pain has been going on her a week.  *Pt wants to speak with [facility] nurse.

## 2021-10-30 LAB — CBC WITH DIFFERENTIAL/PLATELET
Absolute Monocytes: 832 cells/uL (ref 200–950)
Basophils Absolute: 33 cells/uL (ref 0–200)
Basophils Relative: 0.5 %
Eosinophils Absolute: 191 cells/uL (ref 15–500)
Eosinophils Relative: 2.9 %
HCT: 45.4 % — ABNORMAL HIGH (ref 35.0–45.0)
Hemoglobin: 15.2 g/dL (ref 11.7–15.5)
Lymphs Abs: 1650 cells/uL (ref 850–3900)
MCH: 31.2 pg (ref 27.0–33.0)
MCHC: 33.5 g/dL (ref 32.0–36.0)
MCV: 93.2 fL (ref 80.0–100.0)
MPV: 9.3 fL (ref 7.5–12.5)
Monocytes Relative: 12.6 %
Neutro Abs: 3894 cells/uL (ref 1500–7800)
Neutrophils Relative %: 59 %
Platelets: 290 10*3/uL (ref 140–400)
RBC: 4.87 10*6/uL (ref 3.80–5.10)
RDW: 12.3 % (ref 11.0–15.0)
Total Lymphocyte: 25 %
WBC: 6.6 10*3/uL (ref 3.8–10.8)

## 2021-10-30 LAB — COMPREHENSIVE METABOLIC PANEL
AG Ratio: 1.7 (calc) (ref 1.0–2.5)
ALT: 14 U/L (ref 6–29)
AST: 17 U/L (ref 10–35)
Albumin: 4.1 g/dL (ref 3.6–5.1)
Alkaline phosphatase (APISO): 45 U/L (ref 37–153)
BUN: 18 mg/dL (ref 7–25)
CO2: 24 mmol/L (ref 20–32)
Calcium: 9.4 mg/dL (ref 8.6–10.4)
Chloride: 103 mmol/L (ref 98–110)
Creat: 0.87 mg/dL (ref 0.60–1.00)
Globulin: 2.4 g/dL (calc) (ref 1.9–3.7)
Glucose, Bld: 85 mg/dL (ref 65–99)
Potassium: 4.1 mmol/L (ref 3.5–5.3)
Sodium: 140 mmol/L (ref 135–146)
Total Bilirubin: 0.5 mg/dL (ref 0.2–1.2)
Total Protein: 6.5 g/dL (ref 6.1–8.1)

## 2021-10-30 LAB — LIPASE: Lipase: 27 U/L (ref 7–60)

## 2021-11-01 ENCOUNTER — Ambulatory Visit (INDEPENDENT_AMBULATORY_CARE_PROVIDER_SITE_OTHER): Payer: Medicare HMO

## 2021-11-01 ENCOUNTER — Telehealth: Payer: Self-pay | Admitting: Internal Medicine

## 2021-11-01 ENCOUNTER — Telehealth: Payer: Self-pay

## 2021-11-01 ENCOUNTER — Other Ambulatory Visit: Payer: Self-pay | Admitting: Family Medicine

## 2021-11-01 ENCOUNTER — Emergency Department (HOSPITAL_COMMUNITY)
Admission: EM | Admit: 2021-11-01 | Discharge: 2021-11-02 | Disposition: A | Discharge status: Home / Self Care | Payer: Medicare HMO | Attending: Emergency Medicine | Admitting: Emergency Medicine

## 2021-11-01 DIAGNOSIS — K5900 Constipation, unspecified: Secondary | ICD-10-CM

## 2021-11-01 DIAGNOSIS — R109 Unspecified abdominal pain: Secondary | ICD-10-CM | POA: Diagnosis not present

## 2021-11-01 DIAGNOSIS — R195 Other fecal abnormalities: Secondary | ICD-10-CM | POA: Diagnosis not present

## 2021-11-01 DIAGNOSIS — R1084 Generalized abdominal pain: Secondary | ICD-10-CM | POA: Insufficient documentation

## 2021-11-01 DIAGNOSIS — N3289 Other specified disorders of bladder: Secondary | ICD-10-CM | POA: Diagnosis not present

## 2021-11-01 DIAGNOSIS — K5730 Diverticulosis of large intestine without perforation or abscess without bleeding: Secondary | ICD-10-CM | POA: Diagnosis not present

## 2021-11-01 DIAGNOSIS — I700 Atherosclerosis of aorta: Secondary | ICD-10-CM | POA: Diagnosis not present

## 2021-11-01 NOTE — ED Provider Triage Note (Signed)
Emergency Medicine Provider Triage Evaluation Note  Jasmine Esparza , a 81 y.o. female  was evaluated in triage.  Pt complains of constipation.  Patient states that since last Wednesday she has not had a bowel movement.  Patient states that she has been seen by her PCP for this, had lab work done as well as abdominal x-rays which showed nonobstructive bowel gas pattern with moderate colonic stool burden.  Patient denies any recent fevers, opioid usage.  I advised the patient of ways to treat her constipation at home however on vital sign check her blood pressure was elevated to 236/86.  The patient has no history of high blood pressure.  The patient denies chest pain, shortness of breath, blurred vision, headache.  I advised the patient to stay to be evaluated.  Review of Systems  Positive:  Negative:   Physical Exam  BP (!) 216/94 (BP Location: Right Arm)   Pulse 78   Temp 98.3 F (36.8 C) (Oral)   Resp 18   Ht 5\' 1"  (1.549 m)   Wt 53.5 kg   SpO2 100%   BMI 22.30 kg/m  Gen:   Awake, no distress   Resp:  Normal effort  MSK:   Moves extremities without difficulty  Other:    Medical Decision Making  Medically screening exam initiated at 11:57 PM.  Appropriate orders placed.  [name] was informed that the remainder of the evaluation will be completed by another provider, this initial triage assessment does not replace that evaluation, and the importance of remaining in the ED until their evaluation is complete.     [name], PA-C 11/01/21 2358

## 2021-11-01 NOTE — Progress Notes (Signed)
Please let her know that her X ray shows a moderate amount of stool throughout her colon but no sign of an obstruction. She may continue Miralax and increase to twice daily dosing for now until she starts having bowel movements and then she can back off and titrate the dose to 1 cap full per day or 1/2 cap full to help keep her more regular. If this is not working for her then we can refer her to GI also. Please check on the scheduling of her abdominal US and schedule her for a follow up with me Friday. Thanks.

## 2021-11-01 NOTE — Telephone Encounter (Signed)
Pt came in and said Vickie sent her for X-Rays, pt had them taken , but wants to know what is going to happen next. Please call and advise pt.

## 2021-11-01 NOTE — Telephone Encounter (Signed)
Called pt and informed her of Xray ordered and recommendations below. Pt verbalized understanding and will come in to get her Xray sometime today.   Pt denies any fever, chills, nausea or vomiting but is having some abdominal pain.

## 2021-11-01 NOTE — Telephone Encounter (Signed)
Pt is calling to report she start the Miralax right away Friday 6/2. Pt reports that she hasn't had a BM since Wednesday 5/31.   She is taking 17g of Miralax once day. Pt also states she took 2 Dulcolax last night and still no BM. Pt is wanting to know is there something else she should be doing.   Please advise

## 2021-11-01 NOTE — Telephone Encounter (Signed)
Pt was called and given both results and Vickie's recommendations.

## 2021-11-01 NOTE — ED Triage Notes (Addendum)
Pt complains of abdominal pain and constipation x 1 week. Pt states that she was seen and given Miralax but nothing is helping. Pt reports not having high blood pressure.

## 2021-11-01 NOTE — Telephone Encounter (Signed)
Called pt and she has still not been able to have a BM, also states she is unable to pass gas and is wondering what the next steps are?

## 2021-11-02 ENCOUNTER — Other Ambulatory Visit: Payer: Self-pay

## 2021-11-02 ENCOUNTER — Emergency Department (HOSPITAL_COMMUNITY): Payer: Medicare HMO

## 2021-11-02 ENCOUNTER — Encounter (HOSPITAL_COMMUNITY): Payer: Self-pay

## 2021-11-02 DIAGNOSIS — K5900 Constipation, unspecified: Secondary | ICD-10-CM | POA: Diagnosis not present

## 2021-11-02 DIAGNOSIS — N3289 Other specified disorders of bladder: Secondary | ICD-10-CM | POA: Diagnosis not present

## 2021-11-02 DIAGNOSIS — K5730 Diverticulosis of large intestine without perforation or abscess without bleeding: Secondary | ICD-10-CM | POA: Diagnosis not present

## 2021-11-02 DIAGNOSIS — I700 Atherosclerosis of aorta: Secondary | ICD-10-CM | POA: Diagnosis not present

## 2021-11-02 LAB — COMPREHENSIVE METABOLIC PANEL
ALT: 17 U/L (ref 0–44)
AST: 20 U/L (ref 15–41)
Albumin: 4.3 g/dL (ref 3.5–5.0)
Alkaline Phosphatase: 50 U/L (ref 38–126)
Anion gap: 7 (ref 5–15)
BUN: 15 mg/dL (ref 8–23)
CO2: 26 mmol/L (ref 22–32)
Calcium: 8.9 mg/dL (ref 8.9–10.3)
Chloride: 105 mmol/L (ref 98–111)
Creatinine, Ser: 0.63 mg/dL (ref 0.44–1.00)
GFR, Estimated: >60 mL/min (ref >60)
Glucose, Bld: 106 mg/dL — ABNORMAL HIGH (ref 70–99)
Potassium: 3.7 mmol/L (ref 3.5–5.1)
Sodium: 138 mmol/L (ref 135–145)
Total Bilirubin: 0.8 mg/dL (ref 0.3–1.2)
Total Protein: 7 g/dL (ref 6.5–8.1)

## 2021-11-02 LAB — CBC
HCT: 45.3 % (ref 36.0–46.0)
Hemoglobin: 15 g/dL (ref 12.0–15.0)
MCH: 30.8 pg (ref 26.0–34.0)
MCHC: 33.1 g/dL (ref 30.0–36.0)
MCV: 93 fL (ref 80.0–100.0)
Platelets: 276 10*3/uL (ref 150–400)
RBC: 4.87 MIL/uL (ref 3.87–5.11)
RDW: 12.7 % (ref 11.5–15.5)
WBC: 5.8 10*3/uL (ref 4.0–10.5)
nRBC: 0 % (ref 0.0–0.2)

## 2021-11-02 LAB — LIPASE, BLOOD: Lipase: 39 U/L (ref 11–51)

## 2021-11-02 MED ORDER — ONDANSETRON HCL 4 MG/2ML IJ SOLN
4.0000 mg | Freq: Once | INTRAMUSCULAR | Status: DC
  Filled 2021-11-02: qty 2

## 2021-11-02 MED ORDER — BISACODYL 10 MG RE SUPP: 10.0000 mg | RECTAL | 0 refills | Status: DC | PRN

## 2021-11-02 MED ORDER — SODIUM CHLORIDE 0.9 % IV BOLUS
500.0000 mL | Freq: Once | INTRAVENOUS | Status: AC
  Administered 2021-11-02: 500 mL via INTRAVENOUS

## 2021-11-02 MED ORDER — FAMOTIDINE IN NACL 20-0.9 MG/50ML-% IV SOLN
20.0000 mg | Freq: Once | INTRAVENOUS | Status: AC
Start: 2021-11-02 — End: 2021-11-02
  Administered 2021-11-02: 20 mg via INTRAVENOUS
  Filled 2021-11-02: qty 50

## 2021-11-02 MED ORDER — IOHEXOL 300 MG/ML  SOLN
100.0000 mL | Freq: Once | INTRAMUSCULAR | Status: AC | PRN
  Administered 2021-11-02: 100 mL via INTRAVENOUS

## 2021-11-02 NOTE — Discharge Instructions (Signed)
You were evaluated in the Emergency Department and after careful evaluation, we did not find any emergent condition requiring admission or further testing in the hospital.  Your exam/testing today was overall reassuring.  Symptoms seem to be due to constipation.  Use the MiraLAX up to 6 times daily until you achieve soft frequent stools.  At that time can reduce dosing to once or twice daily can use the Dulcolax suppositories as needed as well.  Please return to the Emergency Department if you experience any worsening of your condition.  Thank you for allowing [place] to be a part of your care.

## 2021-11-02 NOTE — ED Notes (Signed)
Urine sent down to main lab

## 2021-11-02 NOTE — ED Provider Notes (Signed)
[facility]-EMERGENCY DEPT [facility] Emergency Department Provider Note MRN:  [id number]  Arrival date & time: 11/02/21     Chief Complaint   Abdominal Pain and Constipation   History of Present Illness   Jasmine Esparza is a 81 y.o. year-old female with no pertinent past medical history presenting to the ED with chief complaint of abdominal pain.  Severe abdominal pain, unable to sleep this evening.  No bowel movement for the past week, not passing any gas over the past day or 2.  Pain is diffuse, currently worst in the right upper quadrant.  Nausea but no vomiting, no chest pain or shortness of breath.  Review of Systems  A thorough review of systems was obtained and all systems are negative except as noted in the HPI and PMH.   Patient's Health History    Past Medical History:  Diagnosis Date   ALLERGIC RHINITIS    ECZEMA    GOITER, MULTINODULAR    s/p I-131 ablation 10/2009   Hypercalcemia    HYPERLIPIDEMIA    OSTEOPOROSIS     Past Surgical History:  Procedure Laterality Date   CATARACT EXTRACTION W/ INTRAOCULAR LENS IMPLANT Bilateral 2013    Family History  Problem Relation Age of Onset   Stroke Mother    Arthritis Mother    Hypertension Mother    Prostate cancer Father    Arthritis Father    Hypertension Sister    Osteoporosis Sister    Hypertension Brother    Hyperlipidemia Brother     Social History   Socioeconomic History   Marital status: Married    Spouse name: Not on file   Number of children: 3   Years of education: Not on file   Highest education level: Not on file  Occupational History   Not on file  Tobacco Use   Smoking status: Never   Smokeless tobacco: Never   Tobacco comments:    Married, lives with spouse. retired  Building services engineer Use: Never used  Substance and Sexual Activity   Alcohol use: No   Drug use: No   Sexual activity: Not Currently  Other Topics Concern   Not on file  Social History Narrative   Not on file    Social Determinants of Health   Financial Resource Strain: Not on file  Food Insecurity: Not on file  Transportation Needs: Not on file  Physical Activity: Not on file  Stress: Not on file  Social Connections: Not on file  Intimate Partner Violence: Not on file     Physical Exam   Vitals:   11/02/21 0453 11/02/21 0615  BP: (!) 198/99 (!) 178/94  Pulse: 75 82  Resp: 16 20  Temp:    SpO2: 97% 99%    CONSTITUTIONAL: Well-appearing, NAD NEURO/PSYCH:  Alert and oriented x 3, no focal deficits EYES:  eyes equal and reactive ENT/NECK:  no LAD, no JVD CARDIO: Regular rate, well-perfused, normal S1 and S2 PULM:  CTAB no wheezing or rhonchi GI/GU:  non-distended, non-tender MSK/SPINE:  No gross deformities, no edema SKIN:  no rash, atraumatic   *Additional and/or pertinent findings included in MDM below  Diagnostic and Interventional Summary    EKG Interpretation  Date/Time:    Ventricular Rate:    PR Interval:    QRS Duration:   QT Interval:    QTC Calculation:   R Axis:     Text Interpretation:         Labs Reviewed  COMPREHENSIVE  METABOLIC PANEL - Abnormal; Notable for the following components:      Result Value   Glucose, Bld 106 (*)    All other components within normal limits  CBC  LIPASE, BLOOD    CT ABDOMEN PELVIS W CONTRAST    (Results Pending)    Medications  ondansetron (ZOFRAN) injection 4 mg (has no administration in time range)  famotidine (PEPCID) IVPB 20 mg premix (20 mg Intravenous New Bag/Given 11/02/21 0534)  sodium chloride 0.9 % bolus 500 mL (500 mLs Intravenous New Bag/Given 11/02/21 0533)  iohexol (OMNIPAQUE) 300 MG/ML solution 100 mL (100 mLs Intravenous Contrast Given 11/02/21 4097)     Procedures  /  Critical Care Procedures  ED Course and Medical Decision Making  Initial Impression and Ddx Favoring constipation versus fecal impaction however considering small bowel obstruction given lack of flatus.  Awaiting labs, CT  Past  medical/surgical history that increases complexity of ED encounter: None  Interpretation of Diagnostics I personally reviewed the laboratory assessment and my interpretation is as follows: No significant blood count or electrolyte disturbance  Awaiting CT imaging  Patient Reassessment and Ultimate Disposition/Management     Signed out to oncoming provider.  Patient management required discussion with the following services or consulting groups:  None  Complexity of Problems Addressed Acute illness or injury that poses threat of life of bodily function  Additional Data Reviewed and Analyzed Further history obtained from: Further history from spouse/family member  Additional Factors Impacting ED Encounter Risk Consideration of hospitalization  Elmer Sow. Pilar Plate, MD [facility] Emergency Medicine [facility] [email or web address]  Final Clinical Impressions(s) / ED Diagnoses     ICD-10-CM   1. Generalized abdominal pain  R10.84       ED Discharge Orders     None        Discharge Instructions Discussed with and Provided to Patient:   Discharge Instructions   None      Sabas Sous, MD 11/02/21 [phone]

## 2021-11-03 ENCOUNTER — Encounter: Payer: Self-pay | Admitting: Internal Medicine

## 2021-11-03 ENCOUNTER — Ambulatory Visit (INDEPENDENT_AMBULATORY_CARE_PROVIDER_SITE_OTHER): Payer: Medicare HMO | Admitting: Internal Medicine

## 2021-11-03 VITALS — BP 218/106 | HR 79 | Temp 98.6°F | Ht 61.0 in | Wt 119.0 lb

## 2021-11-03 DIAGNOSIS — R030 Elevated blood-pressure reading, without diagnosis of hypertension: Secondary | ICD-10-CM | POA: Insufficient documentation

## 2021-11-03 DIAGNOSIS — R109 Unspecified abdominal pain: Secondary | ICD-10-CM

## 2021-11-03 DIAGNOSIS — K5900 Constipation, unspecified: Secondary | ICD-10-CM | POA: Diagnosis not present

## 2021-11-03 MED ORDER — MELOXICAM 15 MG PO TABS: 15.0000 mg | ORAL_TABLET | Freq: Every day | ORAL | 2 refills | Status: DC | PRN

## 2021-11-03 MED ORDER — AMLODIPINE BESYLATE 5 MG PO TABS: 5.0000 mg | ORAL_TABLET | Freq: Every day | ORAL | 5 refills | Status: DC

## 2021-11-03 MED ORDER — CEPHALEXIN 500 MG PO CAPS: 500.0000 mg | ORAL_CAPSULE | Freq: Three times a day (TID) | ORAL | 0 refills | Status: DC

## 2021-11-03 NOTE — Assessment & Plan Note (Signed)
Also miralax not working all that well - for sample trulance daily for 5- 7 days until improved

## 2021-11-03 NOTE — Assessment & Plan Note (Signed)
Severe today, likely reactive, ok for norvasc 5 qd but may not need long term

## 2021-11-03 NOTE — Progress Notes (Signed)
Patient ID: KALANA YUST, female   DOB: 12/18/1940, 81 y.o.   MRN: [id number]        Chief Complaint: follow up post ED visit 6/5 abd pain       HPI:  JALEYAH LONGHI is a 81 y.o. female here with c/o persistent abd pain after ED visit, pain is moderate, generalized abut possible worse to the lower abd, constant, with nausea but no vomiting, radiation, back pain, fever, chills, uncomfortable with any movement, nothing else seems to make better or worse.  Denies urinary symptoms such as dysuria, frequency, urgency, flank pain, hematuria; Pt states UA collected at ED but no results on chart.  Last BM x 1 wk but passing gas.  Lab with normal cbc, lipase, LFTs, lytes, glc 106.  CT abd/pelvis wit no acute findings, but + for mild stool burden.  Also some ? Bladder wall thickening.   Pt denies chest pain, increased sob or doe, wheezing, orthopnea, PND, increased LE swelling, palpitations, dizziness or syncope.   Pt denies polydipsia, polyuria, or new focal neuro s/s.      Wt Readings from Last 3 Encounters:  11/03/21 119 lb (54 kg)  11/01/21 118 lb (53.5 kg)  10/29/21 117 lb (53.1 kg)   BP Readings from Last 3 Encounters:  11/03/21 (!) 218/106  11/02/21 (!) 187/76  10/29/21 126/72         Past Medical History:  Diagnosis Date   ALLERGIC RHINITIS    ECZEMA    GOITER, MULTINODULAR    s/p I-131 ablation 10/2009   Hypercalcemia    HYPERLIPIDEMIA    OSTEOPOROSIS    Past Surgical History:  Procedure Laterality Date   CATARACT EXTRACTION W/ INTRAOCULAR LENS IMPLANT Bilateral 2013    reports that she has never smoked. She has never used smokeless tobacco. She reports that she does not drink alcohol and does not use drugs. family history includes Arthritis in her father and mother; Hyperlipidemia in her brother; Hypertension in her brother, mother, and sister; Osteoporosis in her sister; Prostate cancer in her father; Stroke in her mother. Allergies  Allergen Reactions   Codeine     REACTION:  nausea   Current Outpatient Medications on File Prior to Visit  Medication Sig Dispense Refill   bisacodyl (DULCOLAX) 10 MG suppository Place 1 suppository (10 mg total) rectally as needed for moderate constipation. 12 suppository 0   polyethylene glycol (MIRALAX / GLYCOLAX) 17 g packet Take 17 g by mouth daily.     Calcium Carbonate-Vitamin D 600-400 MG-UNIT tablet Take 1 tablet by mouth 2 (two) times daily.   (Patient not taking: Reported on 10/29/2021)     No current facility-administered medications on file prior to visit.        ROS:  All others reviewed and negative.  Objective        PE:  BP (!) 218/106 (BP Location: Left Arm, Patient Position: Sitting, Cuff Size: Normal)   Pulse 79   Temp 98.6 F (37 C) (Oral)   Ht 5\' 1"  (1.549 m)   Wt 119 lb (54 kg)   SpO2 98%   BMI 22.48 kg/m                 Constitutional: Pt appears in NAD               HENT: Head: NCAT.                Right Ear: External ear normal.  Left Ear: External ear normal.                Eyes: . Pupils are equal, round, and reactive to light. Conjunctivae and EOM are normal               Nose: without d/c or deformity               Neck: Neck supple. Gross normal ROM               Cardiovascular: Normal rate and regular rhythm.                 Pulmonary/Chest: Effort normal and breath sounds without rales or wheezing.                Abd:  Soft, diffuse mild tender mild worse at the right > left lower abd without guarding or rebound, ND, + BS, no organomegaly               Neurological: Pt is alert. At baseline orientation, motor grossly intact               Skin: Skin is warm. No rashes, no other new lesions, LE edema - none               Psychiatric: Pt behavior is normal with marked anxiety  Micro: none  Cardiac tracings I have personally interpreted today:  none  Pertinent Radiological findings (summarize): none   Lab Results  Component Value Date   WBC 5.8 11/02/2021   HGB 15.0  11/02/2021   HCT 45.3 11/02/2021   PLT 276 11/02/2021   GLUCOSE 106 (H) 11/02/2021   CHOL 220 (H) 08/31/2021   TRIG 102.0 08/31/2021   HDL 74.90 08/31/2021   LDLDIRECT 114.9 03/11/2013   LDLCALC 124 (H) 08/31/2021   ALT 17 11/02/2021   AST 20 11/02/2021   NA 138 11/02/2021   K 3.7 11/02/2021   CL 105 11/02/2021   CREATININE 0.63 11/02/2021   BUN 15 11/02/2021   CO2 26 11/02/2021   TSH 1.62 12/05/2016   Assessment/Plan:  KAIDANCE PANTOJA is a 81 y.o. White or Caucasian [1] female with  has a past medical history of ALLERGIC RHINITIS, ECZEMA, GOITER, MULTINODULAR, Hypercalcemia, HYPERLIPIDEMIA, and OSTEOPOROSIS.  Abdominal pain Today primarily RLQ to mid lower abd, and right mid abd as well; pain about 7/10, clutching stomach very uncomfortable with significant anxiety and severe elevated BP -   Suspect possible UTI ? not resulted at ED visit 6/6 +/- constipation (pt states UA obtained but no results done or listed in results today)   For urine studies/culture, mobic 15 qd prn (renal fxn ok and avoid further constipation with narcotics); empiric cephalexin and f/u PCP 1 wk  Constipation Also miralax not working all that well - for sample trulance daily for 5- 7 days until improved  Blood pressure elevated without history of HTN Severe today, likely reactive, ok for norvasc 5 qd but may not need long term  Followup: No follow-ups on file.  Oliver Barre, MD 11/06/2021 7:37 AM [facility] Internal Medicine

## 2021-11-03 NOTE — Patient Instructions (Addendum)
You are given the trulance samples today -  1 per day until improved, then resume the daily miralax  Please take all new medication as prescribed - the antibiotic (cephalexin), amlodipine 5 mg per day for blood pressure, and pain medicine as needed  Please continue all other medications as before, and refills have been done if requested.  Please have the pharmacy call with any other refills you may need  Please keep your appointments with your specialists as you may have planned  Please go to the LAB at the blood drawing area for the tests to be done - just the urine testing today  You will be contacted by phone if any changes need to be made immediately.  Otherwise, you will receive a letter about your results with an explanation, but please check with MyChart first.  Please remember to sign up for MyChart if you have not done so, as this will be important to you in the future with finding out test results, communicating by private email, and scheduling acute appointments online when needed.  Please see Dr Okey Dupre in 1 week to [facility]

## 2021-11-03 NOTE — Assessment & Plan Note (Addendum)
Today primarily RLQ to mid lower abd, and right mid abd as well; pain about 7/10, clutching stomach very uncomfortable with significant anxiety and severe elevated BP -   Suspect possible UTI ? not resulted at ED visit 6/6 +/- constipation (pt states UA obtained but no results done or listed in results today)   For urine studies/culture, mobic 15 qd prn (renal fxn ok and avoid further constipation with narcotics); empiric cephalexin and f/u PCP 1 wk

## 2021-11-05 ENCOUNTER — Ambulatory Visit: Payer: Medicare HMO | Admitting: Family Medicine

## 2021-11-05 LAB — URINE CULTURE

## 2021-11-06 ENCOUNTER — Encounter: Payer: Self-pay | Admitting: Internal Medicine

## 2021-11-08 ENCOUNTER — Other Ambulatory Visit: Payer: Medicare HMO

## 2021-11-10 ENCOUNTER — Ambulatory Visit (INDEPENDENT_AMBULATORY_CARE_PROVIDER_SITE_OTHER): Payer: Medicare HMO | Admitting: Internal Medicine

## 2021-11-10 ENCOUNTER — Ambulatory Visit: Payer: Medicare HMO | Admitting: Internal Medicine

## 2021-11-10 VITALS — BP 132/84 | HR 72 | Temp 98.7°F | Ht 61.0 in | Wt 123.2 lb

## 2021-11-10 DIAGNOSIS — R69 Illness, unspecified: Secondary | ICD-10-CM | POA: Diagnosis not present

## 2021-11-10 DIAGNOSIS — R030 Elevated blood-pressure reading, without diagnosis of hypertension: Secondary | ICD-10-CM

## 2021-11-10 DIAGNOSIS — K5900 Constipation, unspecified: Secondary | ICD-10-CM | POA: Diagnosis not present

## 2021-11-10 DIAGNOSIS — F419 Anxiety disorder, unspecified: Secondary | ICD-10-CM

## 2021-11-10 NOTE — Progress Notes (Signed)
Patient ID: EDIT Jasmine Esparza, female   DOB: September 18, 1940, 81 y.o.   MRN: [id number]        Chief Complaint: follow up HTN, dysuria, constipation, anxiety       HPI:  Jasmine Esparza is a 81 y.o. female here to f/u at 1 wk post significant episode of dysuria (urine culture neg), constipation, and severe htn all in the setting of worsening anxiety.  Today - Denies urinary symptoms such as dysuria, frequency, urgency, flank pain, hematuria or n/v, fever, chills.  Denies worsening reflux, abd pain, dysphagia, n/v, bowel change or blood.  Pt is quite please with her BP and in fact has been lower than this at home in last few days.  Would like to stop the amlodipine if possible.  Denies worsening depressive symptoms, suicidal ideation, or panic; has ongoing anxiety, with marked increased last wk with abd pain it seems.        Wt Readings from Last 3 Encounters:  11/10/21 123 lb 4 oz (55.9 kg)  11/03/21 119 lb (54 kg)  11/01/21 118 lb (53.5 kg)   BP Readings from Last 3 Encounters:  11/10/21 132/84  11/03/21 (!) 218/106  11/02/21 (!) 187/76         Past Medical History:  Diagnosis Date   ALLERGIC RHINITIS    ECZEMA    GOITER, MULTINODULAR    s/p I-131 ablation 10/2009   Hypercalcemia    HYPERLIPIDEMIA    OSTEOPOROSIS    Past Surgical History:  Procedure Laterality Date   CATARACT EXTRACTION W/ INTRAOCULAR LENS IMPLANT Bilateral 2013    reports that she has never smoked. She has never used smokeless tobacco. She reports that she does not drink alcohol and does not use drugs. family history includes Arthritis in her father and mother; Hyperlipidemia in her brother; Hypertension in her brother, mother, and sister; Osteoporosis in her sister; Prostate cancer in her father; Stroke in her mother. Allergies  Allergen Reactions   Codeine     REACTION: nausea   Current Outpatient Medications on File Prior to Visit  Medication Sig Dispense Refill   Calcium Carbonate-Vitamin D 600-400 MG-UNIT tablet  Take 1 tablet by mouth 2 (two) times daily.     meloxicam (MOBIC) 15 MG tablet Take 1 tablet (15 mg total) by mouth daily as needed for pain. 30 tablet 2   polyethylene glycol (MIRALAX / GLYCOLAX) 17 g packet Take 17 g by mouth daily.     No current facility-administered medications on file prior to visit.        ROS:  All others reviewed and negative.  Objective        PE:  BP 132/84   Pulse 72   Temp 98.7 F (37.1 C) (Oral)   Ht 5\' 1"  (1.549 m)   Wt 123 lb 4 oz (55.9 kg)   SpO2 96%   BMI 23.29 kg/m                 Constitutional: Pt appears in NAD               HENT: Head: NCAT.                Right Ear: External ear normal.                 Left Ear: External ear normal.                Eyes: . Pupils are equal, round, and reactive to  light. Conjunctivae and EOM are normal               Nose: without d/c or deformity               Neck: Neck supple. Gross normal ROM               Cardiovascular: Normal rate and regular rhythm.                 Pulmonary/Chest: Effort normal and breath sounds without rales or wheezing.                Abd:  Soft, NT, ND, + BS, no organomegaly               Neurological: Pt is alert. At baseline orientation, motor grossly intact               Skin: Skin is warm. No rashes, no other new lesions, LE edema - none               Psychiatric: Pt behavior is normal without agitation , moderatley nervous  Micro: none  Cardiac tracings I have personally interpreted today:  none  Pertinent Radiological findings (summarize): none   Lab Results  Component Value Date   WBC 5.8 11/02/2021   HGB 15.0 11/02/2021   HCT 45.3 11/02/2021   PLT 276 11/02/2021   GLUCOSE 106 (H) 11/02/2021   CHOL 220 (H) 08/31/2021   TRIG 102.0 08/31/2021   HDL 74.90 08/31/2021   LDLDIRECT 114.9 03/11/2013   LDLCALC 124 (H) 08/31/2021   ALT 17 11/02/2021   AST 20 11/02/2021   NA 138 11/02/2021   K 3.7 11/02/2021   CL 105 11/02/2021   CREATININE 0.63 11/02/2021   BUN  15 11/02/2021   CO2 26 11/02/2021   TSH 1.62 12/05/2016   Assessment/Plan:  Jasmine Esparza is a 81 y.o. White or Caucasian [1] female with  has a past medical history of ALLERGIC RHINITIS, ECZEMA, GOITER, MULTINODULAR, Hypercalcemia, HYPERLIPIDEMIA, and OSTEOPOROSIS.  Blood pressure elevated without history of HTN Much improved, likely situational due to pain it seems, should be ok to stop the amlodipine, but to continue to monitor closely at home and next visit  Constipation Much improved with trulance samples and symptomatically resolved for now, pt quite pleased, declines rx for now, and will continue to monitor  Anxiety Rather mod to severe recently, now improved and declines need for med tx such as SSRI for now, or referral for counseling  Followup: Return in about 6 months (around 05/12/2022).  Oliver Barre, MD 11/13/2021 5:13 PM [facility] Internal Medicine

## 2021-11-10 NOTE — Patient Instructions (Addendum)
Ok to stop the amlodipine, and continue to monitor your BP at home, with the goal to be at least less than 140/90 most often  Please continue all other medications as before, and refills have been done if requested.  Please have the pharmacy call with any other refills you may need.  Please keep your appointments with your specialists as you may have planned  Please see Dr Okey Dupre in 6 months, or sooner if needed

## 2021-11-13 ENCOUNTER — Encounter: Payer: Self-pay | Admitting: Internal Medicine

## 2021-11-13 DIAGNOSIS — F418 Other specified anxiety disorders: Secondary | ICD-10-CM | POA: Insufficient documentation

## 2021-11-13 DIAGNOSIS — F419 Anxiety disorder, unspecified: Secondary | ICD-10-CM | POA: Insufficient documentation

## 2021-11-13 DIAGNOSIS — R4589 Other symptoms and signs involving emotional state: Secondary | ICD-10-CM | POA: Insufficient documentation

## 2021-11-13 NOTE — Assessment & Plan Note (Signed)
Rather mod to severe recently, now improved and declines need for med tx such as SSRI for now, or referral for counseling

## 2021-11-13 NOTE — Assessment & Plan Note (Signed)
Much improved with trulance samples and symptomatically resolved for now, pt quite pleased, declines rx for now, and will continue to monitor

## 2021-11-13 NOTE — Assessment & Plan Note (Signed)
Much improved, likely situational due to pain it seems, should be ok to stop the amlodipine, but to continue to monitor closely at home and next visit

## 2021-11-16 ENCOUNTER — Emergency Department (HOSPITAL_BASED_OUTPATIENT_CLINIC_OR_DEPARTMENT_OTHER): Payer: Medicare HMO

## 2021-11-16 ENCOUNTER — Encounter (HOSPITAL_BASED_OUTPATIENT_CLINIC_OR_DEPARTMENT_OTHER): Payer: Self-pay | Admitting: Obstetrics and Gynecology

## 2021-11-16 ENCOUNTER — Emergency Department (HOSPITAL_BASED_OUTPATIENT_CLINIC_OR_DEPARTMENT_OTHER)
Admission: EM | Admit: 2021-11-16 | Discharge: 2021-11-17 | Disposition: A | Discharge status: Home / Self Care | Payer: Medicare HMO | Attending: Emergency Medicine | Admitting: Emergency Medicine

## 2021-11-16 ENCOUNTER — Other Ambulatory Visit: Payer: Self-pay

## 2021-11-16 DIAGNOSIS — I10 Essential (primary) hypertension: Secondary | ICD-10-CM | POA: Insufficient documentation

## 2021-11-16 DIAGNOSIS — R002 Palpitations: Secondary | ICD-10-CM

## 2021-11-16 DIAGNOSIS — R600 Localized edema: Secondary | ICD-10-CM | POA: Diagnosis present

## 2021-11-16 DIAGNOSIS — E876 Hypokalemia: Secondary | ICD-10-CM | POA: Insufficient documentation

## 2021-11-16 DIAGNOSIS — M542 Cervicalgia: Secondary | ICD-10-CM | POA: Diagnosis not present

## 2021-11-16 DIAGNOSIS — R42 Dizziness and giddiness: Secondary | ICD-10-CM | POA: Diagnosis not present

## 2021-11-16 DIAGNOSIS — R030 Elevated blood-pressure reading, without diagnosis of hypertension: Secondary | ICD-10-CM

## 2021-11-16 DIAGNOSIS — Z79899 Other long term (current) drug therapy: Secondary | ICD-10-CM | POA: Diagnosis not present

## 2021-11-16 DIAGNOSIS — R519 Headache, unspecified: Secondary | ICD-10-CM | POA: Diagnosis not present

## 2021-11-16 LAB — URINALYSIS, ROUTINE W REFLEX MICROSCOPIC
Bilirubin Urine: NEGATIVE
Glucose, UA: NEGATIVE mg/dL
Hgb urine dipstick: NEGATIVE
Ketones, ur: NEGATIVE mg/dL
Leukocytes,Ua: NEGATIVE
Nitrite: NEGATIVE
Protein, ur: NEGATIVE mg/dL
Specific Gravity, Urine: <1.005 — ABNORMAL LOW (ref 1.005–1.030)
pH: 7.5 (ref 5.0–8.0)

## 2021-11-16 LAB — BASIC METABOLIC PANEL
Anion gap: 12 (ref 5–15)
BUN: 20 mg/dL (ref 8–23)
CO2: 31 mmol/L (ref 22–32)
Calcium: 10.1 mg/dL (ref 8.9–10.3)
Chloride: 101 mmol/L (ref 98–111)
Creatinine, Ser: 0.86 mg/dL (ref 0.44–1.00)
GFR, Estimated: >60 mL/min (ref >60)
Glucose, Bld: 104 mg/dL — ABNORMAL HIGH (ref 70–99)
Potassium: 3.1 mmol/L — ABNORMAL LOW (ref 3.5–5.1)
Sodium: 144 mmol/L (ref 135–145)

## 2021-11-16 LAB — CBG MONITORING, ED: Glucose-Capillary: 88 mg/dL (ref 70–99)

## 2021-11-16 LAB — CBC
HCT: 46.3 % — ABNORMAL HIGH (ref 36.0–46.0)
Hemoglobin: 15.2 g/dL — ABNORMAL HIGH (ref 12.0–15.0)
MCH: 30.3 pg (ref 26.0–34.0)
MCHC: 32.8 g/dL (ref 30.0–36.0)
MCV: 92.4 fL (ref 80.0–100.0)
Platelets: 262 10*3/uL (ref 150–400)
RBC: 5.01 MIL/uL (ref 3.87–5.11)
RDW: 12.9 % (ref 11.5–15.5)
WBC: 7.6 10*3/uL (ref 4.0–10.5)
nRBC: 0 % (ref 0.0–0.2)

## 2021-11-16 LAB — TROPONIN I (HIGH SENSITIVITY): Troponin I (High Sensitivity): 4 ng/L (ref <18)

## 2021-11-16 LAB — TSH: TSH: 2.459 u[IU]/mL (ref 0.350–4.500)

## 2021-11-16 LAB — BRAIN NATRIURETIC PEPTIDE: B Natriuretic Peptide: 26.5 pg/mL (ref 0.0–100.0)

## 2021-11-16 LAB — MAGNESIUM: Magnesium: 2.5 mg/dL — ABNORMAL HIGH (ref 1.7–2.4)

## 2021-11-16 MED ORDER — IOHEXOL 350 MG/ML SOLN
100.0000 mL | Freq: Once | INTRAVENOUS | Status: AC | PRN
  Administered 2021-11-16: 100 mL via INTRAVENOUS

## 2021-11-16 MED ORDER — HYDRALAZINE HCL 20 MG/ML IJ SOLN
5.0000 mg | Freq: Once | INTRAMUSCULAR | Status: AC
  Administered 2021-11-16: 5 mg via INTRAVENOUS
  Filled 2021-11-16: qty 1

## 2021-11-16 MED ORDER — SODIUM CHLORIDE 0.9 % IV BOLUS
500.0000 mL | Freq: Once | INTRAVENOUS | Status: AC
  Administered 2021-11-16: 500 mL via INTRAVENOUS

## 2021-11-16 MED ORDER — POTASSIUM CHLORIDE CRYS ER 20 MEQ PO TBCR
40.0000 meq | EXTENDED_RELEASE_TABLET | Freq: Once | ORAL | Status: AC
  Administered 2021-11-16: 40 meq via ORAL
  Filled 2021-11-16: qty 2

## 2021-11-16 NOTE — ED Provider Notes (Signed)
[facility] EMERGENCY DEPT Provider Note   CSN: [id number] Arrival date & time: 11/16/21  1845     History {Add pertinent medical, surgical, social history, OB history to HPI:1} Chief Complaint  Patient presents with   Hypertension    Jasmine Esparza is a 81 y.o. female.   Hypertension       Home Medications Prior to Admission medications   Medication Sig Start Date End Date Taking? Authorizing Provider  Calcium Carbonate-Vitamin D 600-400 MG-UNIT tablet Take 1 tablet by mouth 2 (two) times daily.    [provider]  meloxicam (MOBIC) 15 MG tablet Take 1 tablet (15 mg total) by mouth daily as needed for pain. 11/03/21   Corwin Levins, MD  polyethylene glycol (MIRALAX / GLYCOLAX) 17 g packet Take 17 g by mouth daily.    [provider]      Allergies    Codeine    Review of Systems   Review of Systems  Physical Exam Updated Vital Signs BP (!) 187/77   Pulse 81   Temp 98.2 F (36.8 C)   Resp 18   Ht 5\' 1"  (1.549 m)   Wt 55.9 kg   SpO2 98%   BMI 23.29 kg/m  Physical Exam  ED Results / Procedures / Treatments   Labs (all labs ordered are listed, but only abnormal results are displayed) Labs Reviewed  BASIC METABOLIC PANEL - Abnormal; Notable for the following components:      Result Value   Potassium 3.1 (*)    Glucose, Bld 104 (*)    All other components within normal limits  CBC - Abnormal; Notable for the following components:   Hemoglobin 15.2 (*)    HCT 46.3 (*)    All other components within normal limits  URINALYSIS, ROUTINE W REFLEX MICROSCOPIC - Abnormal; Notable for the following components:   Color, Urine COLORLESS (*)    Specific Gravity, Urine <1.005 (*)    All other components within normal limits  CBG MONITORING, ED    EKG None  Radiology No results found.  Procedures Procedures  {Document cardiac monitor, telemetry assessment procedure when appropriate:1}  Medications Ordered in ED Medications -  No data to display  ED Course/ Medical Decision Making/ A&P                           Medical Decision Making Amount and/or Complexity of Data Reviewed Labs: ordered.    Jasmine Esparza is a 81 y.o. female with a past medical history significant for hyperlipidemia, osteoporosis, eczema, elevated blood pressure currently on amlodipine, and recent urinary tract infection status post antibiotics who presents with multiple complaints including blood pressures into the 190s at home, neck pain and headache, very dry mouth and gland soreness under her jaw, and dizziness.  Patient reports that over the last 2 weeks she has been on amlodipine after being found to have elevated blood pressures.  She said that her blood present improved but over the last day or 2 it started going up again.  She reports that she has been having more episodes of dizziness where she felt unsteady but denying lightheadedness.  She reports she is also having some pain in her anterior neck and head.  She does not report any speech difficulty or vision changes.  She denies any nausea, vomiting, constipation, or diarrhea.  She reports that her urinary pain has improved after the antibiotics for the UTI that  has cleared.  She reports some mild edema in her legs.  She says that she has had episodes of feeling that "something was wrong with my heart" with palpitations when she was having some of the symptoms.  She is denying any chest pain or shortness of breath.  She denies any fevers, chills, congestion, or cough.  Denies any posterior neck pain or neck stiffness.  On exam, mouth is very dry.  Patient has some soreness and tenderness under her tongue but there is no unilateral tenderness or swelling.  Normal neck range of motion.  No carotid bruit appreciated.  No murmur.  Chest and abdomen nontender.  Lungs clear.  Back and flanks nontender.  No focal neurologic deficits with intact sensation, strength, and pulses in extremities.  Pupils  symmetric and reactive normal extraocular movements.  Clear speech.  Given the patient's reported blood pressure 7 going into the 190s with her sensation of dizziness, palpitations, and the neck soreness, I do feel need to rule out a vascular problem or even stroke.  We will give her some blood pressure medicine to improve her blood pressure, we will get a CT of the head and neck, and we will get a chest x-ray with the "heart feeling wrong" sensation.  We will get electrolytes and put her on the monitor.  We will get EKG.  Patient had some labs in triage that were overall reassuring.  She does have mild hypokalemia so we will give her some oral supplementation.   {Document critical care time when appropriate:1} {Document review of labs and clinical decision tools ie heart score, Chads2Vasc2 etc:1}  {Document your independent review of radiology images, and any outside records:1} {Document your discussion with family members, caretakers, and with consultants:1} {Document social determinants of health affecting pt's care:1} {Document your decision making why or why not admission, treatments were needed:1} Final Clinical Impression(s) / ED Diagnoses Final diagnoses:  None    Rx / DC Orders ED Discharge Orders     None

## 2021-11-16 NOTE — ED Notes (Signed)
Patient transported to CT 

## 2021-11-16 NOTE — ED Notes (Signed)
Per EDP Tegeler result trop from initial labs and recollect an additional trop now for the delta trop. Communicated to primary RN Delice Bison and [employer].

## 2021-11-16 NOTE — ED Triage Notes (Signed)
Patient reports to the ER for hypertension. Patient reports she feels like she has swelling in her glands and her mouth feels dry. Patient reports she believes she is having an allergic reaction to her Amlodopine which she has been taking since 11/03/21

## 2021-11-16 NOTE — ED Notes (Signed)
Lab notified of BNP add on.

## 2021-11-17 ENCOUNTER — Telehealth: Payer: Self-pay | Admitting: Internal Medicine

## 2021-11-17 LAB — TROPONIN I (HIGH SENSITIVITY): Troponin I (High Sensitivity): 4 ng/L (ref <18)

## 2021-11-17 MED ORDER — AMLODIPINE BESYLATE 5 MG PO TABS: 5.0000 mg | ORAL_TABLET | Freq: Every day | ORAL | 1 refills | Status: DC

## 2021-11-17 NOTE — Telephone Encounter (Signed)
BP Readings from Last 3 Encounters:  11/17/21 (!) 145/62  11/10/21 132/84  11/03/21 (!) 218/106   I think that would be fine - I will send amlodipine 5 mg refils - done erx

## 2021-11-17 NOTE — Telephone Encounter (Signed)
Pt called in to see if she could resume taking amlodipine due to elevated BP.   Please advise.

## 2021-11-17 NOTE — Discharge Instructions (Signed)
Your history, exam, work-up today did not show evidence of acute stroke.  The imaging did show evidence of some stenosis in your neurovasculature but no evidence of acute blockages.  I suspect your elevated blood pressures contributed some of your dizzy symptoms however your blood pressures were able to improve while in the emergency department and your other work-up was reassuring.  We did find some low potassium which we replaced but otherwise no evidence of acute infection at this time.  Please rest and stay hydrated and follow-up with your primary doctor.  Please call them tomorrow to discuss blood pressure management changes and also follow-up with neurology given the dizziness and the stenotic vessels in your head.  If any symptoms change or worsen acutely, please return to the nearest emergency department.

## 2021-11-17 NOTE — Telephone Encounter (Signed)
Pt stated she went to ER also wanted to know if she should continue to take meds to bring BP down that she got from ER visit in addition to the BP Meds from Dr.John   Please Advise

## 2021-11-18 ENCOUNTER — Emergency Department (HOSPITAL_BASED_OUTPATIENT_CLINIC_OR_DEPARTMENT_OTHER)
Admission: EM | Admit: 2021-11-18 | Discharge: 2021-11-18 | Disposition: A | Discharge status: Home / Self Care | Payer: Medicare HMO | Attending: Emergency Medicine | Admitting: Emergency Medicine

## 2021-11-18 ENCOUNTER — Other Ambulatory Visit: Payer: Self-pay

## 2021-11-18 ENCOUNTER — Telehealth: Payer: Self-pay | Admitting: Internal Medicine

## 2021-11-18 ENCOUNTER — Ambulatory Visit: Payer: Self-pay

## 2021-11-18 ENCOUNTER — Encounter (HOSPITAL_BASED_OUTPATIENT_CLINIC_OR_DEPARTMENT_OTHER): Payer: Self-pay | Admitting: Emergency Medicine

## 2021-11-18 DIAGNOSIS — T887XXA Unspecified adverse effect of drug or medicament, initial encounter: Secondary | ICD-10-CM

## 2021-11-18 DIAGNOSIS — R42 Dizziness and giddiness: Secondary | ICD-10-CM | POA: Diagnosis not present

## 2021-11-18 DIAGNOSIS — E876 Hypokalemia: Secondary | ICD-10-CM | POA: Insufficient documentation

## 2021-11-18 DIAGNOSIS — T461X5A Adverse effect of calcium-channel blockers, initial encounter: Secondary | ICD-10-CM | POA: Diagnosis not present

## 2021-11-18 DIAGNOSIS — Z79899 Other long term (current) drug therapy: Secondary | ICD-10-CM | POA: Insufficient documentation

## 2021-11-18 DIAGNOSIS — I10 Essential (primary) hypertension: Secondary | ICD-10-CM | POA: Diagnosis not present

## 2021-11-18 HISTORY — DX: Essential (primary) hypertension: I10

## 2021-11-18 MED ORDER — LISINOPRIL 5 MG PO TABS: 5.0000 mg | ORAL_TABLET | Freq: Every day | ORAL | 0 refills | Status: DC

## 2021-11-18 NOTE — ED Provider Notes (Signed)
[facility] EMERGENCY DEPT Provider Note   CSN: [id number] Arrival date & time: 11/18/21  1323     History  Chief Complaint  Patient presents with   Allergic Reaction   Hypertension    Jasmine Esparza is a 81 y.o. female.  HPI     81yo female with history of hyperlipidemia, recent hypertension diagnosis on amlodipine, history of thyroid disease, recent emergency department visit for elevated blood pressures, dry mouth, feeling lightheaded after starting amlodipine 6/7 with work up 2 days ago in the ED including CTA without occlusion or ICH, CXR without acute abnormalities, UA without infection, negative troponins x2, normal TSH, normal Mg, mild hypokalemia, who presents with concern for continued symptoms and that she is having side effects of amlodipine with continued high blood pressures.    Began amlodipine 6/7, ever since beginning it has had feeling of swollen glands, dizziness/lightheadedness.  Constant feeling of lightheadedness since the 7th, since starting it. Mores constant.  No shortness of breath, chest pain, nausea, vomiting, diarrhea, black or bloody stools, fever, urinary symptoms, Denies numbness, weakness, difficulty talking or walking, visual changes or facial droop.   Feels unsteady and more lightheaded when first gets up and goes from sitting to standing.  Not getting any better, has still been taking the medications.  Took one this morning and feels like BP medication is not bringing down pressure and is causing other side effects. Can't be seen until the 6/27.    Symptoms unchanged since 6/20  Has not had problems with BP until this   Past Medical History:  Diagnosis Date   ALLERGIC RHINITIS    ECZEMA    GOITER, MULTINODULAR    s/p I-131 ablation 10/2009   Hypercalcemia    HYPERLIPIDEMIA    Hypertension    OSTEOPOROSIS     Home Medications Prior to Admission medications   Medication Sig Start Date End Date Taking? Authorizing Provider   lisinopril (ZESTRIL) 5 MG tablet Take 1 tablet (5 mg total) by mouth daily. 11/18/21 12/18/21 Yes Alvira Monday, MD  Calcium Carbonate-Vitamin D 600-400 MG-UNIT tablet Take 1 tablet by mouth 2 (two) times daily.    [provider]  meloxicam (MOBIC) 15 MG tablet Take 1 tablet (15 mg total) by mouth daily as needed for pain. 11/03/21   Corwin Levins, MD  polyethylene glycol (MIRALAX / GLYCOLAX) 17 g packet Take 17 g by mouth daily.    [provider]      Allergies    Codeine    Review of Systems   Review of Systems  Physical Exam Updated Vital Signs BP (!) 170/80   Pulse 85   Temp 98 F (36.7 C)   Resp 19   Ht 5\' 1"  (1.549 m)   Wt 55.9 kg   SpO2 97%   BMI 23.29 kg/m  Physical Exam Vitals and nursing note reviewed.  Constitutional:      General: She is not in acute distress.    Appearance: Normal appearance. She is well-developed. She is not ill-appearing or diaphoretic.  HENT:     Head: Normocephalic and atraumatic.  Eyes:     General: No visual field deficit.    Extraocular Movements: Extraocular movements intact.     Conjunctiva/sclera: Conjunctivae normal.     Pupils: Pupils are equal, round, and reactive to light.  Cardiovascular:     Rate and Rhythm: Normal rate and regular rhythm.     Pulses: Normal pulses.     Heart sounds:  Normal heart sounds. No murmur heard.    No friction rub. No gallop.  Pulmonary:     Effort: Pulmonary effort is normal. No respiratory distress.     Breath sounds: Normal breath sounds. No wheezing or rales.  Abdominal:     General: There is no distension.     Palpations: Abdomen is soft.     Tenderness: There is no abdominal tenderness. There is no guarding.  Musculoskeletal:        General: No swelling or tenderness.     Cervical back: Normal range of motion.  Skin:    General: Skin is warm and dry.     Findings: No erythema or rash.  Neurological:     General: No focal deficit present.     Mental Status: She is  alert and oriented to person, place, and time.     GCS: GCS eye subscore is 4. GCS verbal subscore is 5. GCS motor subscore is 6.     Cranial Nerves: No cranial nerve deficit, dysarthria or facial asymmetry.     Sensory: No sensory deficit.     Motor: No weakness or tremor.     Coordination: Coordination normal. Finger-Nose-Finger Test normal.     Gait: Gait normal.     ED Results / Procedures / Treatments   Labs (all labs ordered are listed, but only abnormal results are displayed) Labs Reviewed - No data to display  EKG None  Radiology DG Chest Portable 1 View  Result Date: 11/16/2021 CLINICAL DATA:  Palpitations, hypertension, dizziness. EXAM: PORTABLE CHEST 1 VIEW COMPARISON:  07/06/2009. FINDINGS: The heart is borderline enlarged and the mediastinal contour is within normal limits. Atherosclerotic calcification of the aorta is noted. There is hyperinflation of the lungs with interstitial prominence bilaterally. No consolidation, effusion, or pneumothorax. No acute osseous abnormality. IMPRESSION: 1. Hyperinflation of the lungs with mild interstitial prominence bilaterally, which may be chronic versus edema or infiltrate. 2. Borderline cardiomegaly. Electronically Signed   By: Thornell Sartorius M.D.   On: 11/16/2021 22:54    Procedures Procedures    Medications Ordered in ED Medications - No data to display  ED Course/ Medical Decision Making/ A&P                           Medical Decision Making Risk Prescription drug management.   81yo female with history of hyperlipidemia, recent hypertension diagnosis on amlodipine, history of thyroid disease, recent emergency department visit for elevated blood pressures, dry mouth, feeling lightheaded after starting amlodipine 6/7 with work up 2 days ago in the ED including CTA without occlusion or ICH, CXR without acute abnormalities, UA without infection, negative troponins x2, normal TSH, normal Mg, mild hypokalemia, who presents with  concern for continued symptoms and that she is having side effects of amlodipine with continued high blood pressures.   Reviewed labs and imaging performed 2 days ago as above and denies any change in symptoms since then. No focal findings, normal gait, do not suspect CVA by history, exam.  No new or acute changes to indicate need for repeat imaging or labs.  Given symptoms began after beginning amlodipine, feel it is appropriate to stop amlodipine and try a different antihypertensive medication. History and exam not consistent with anaphylaxis.    Will discontinue amlodipine and trial lisinopril 5mg .  She will follow up with her PCP in 5 days, recommend repeat labs and reevaluation through PCP. Patient discharged in stable condition with  understanding of reasons to return.         Final Clinical Impression(s) / ED Diagnoses Final diagnoses:  Medication side effects  Essential hypertension    Rx / DC Orders ED Discharge Orders          Ordered    lisinopril (ZESTRIL) 5 MG tablet  Daily        11/18/21 1440              Alvira Monday, MD 11/18/21 2256

## 2021-11-18 NOTE — Telephone Encounter (Signed)
Pt is calling to report that she is experiencing throat, ankle swelling, dizziness and still having elevated BP of 170s/90s while taking the amLODipine (NORVASC) 5 MG tablet.  I advised the pt that Dr. Jonny Ruiz requested that she stop taking the amLODipine (NORVASC) 5 MG tablet, Bisacodyl 10mg , Cephalexin 500mg  on 11/10/21.  Pt states that her BP was still high and she is concerned.   I was advised by , CMA that she should report to the ER for the medication reactions and that she should stop taking the medications per Dr. 11/12/21 request. Also advised that she keep the HOS fu for 11/23/21.  FYI

## 2021-11-18 NOTE — Telephone Encounter (Signed)
  Chief Complaint: HTN, Swollen ankles, swollen glands Symptoms: ibid Frequency: Ongoing since Tuesday Pertinent Negatives: Patient denies SOB, Chest pin Disposition: [] ED /[] Urgent Care (no appt availability in office) / [] Appointment(In office/virtual)/ []  [place] [facility]/ [] Home Care/ [] Refused Recommended Disposition /[] [place] [facility]/ [x]  Follow-up with PCP Additional Notes: Pt hs had high BP readings since Tuesday and was seen in the ED . PT continues to have high BP readings, mild ankle swelling and swollen glands and dry mouth. PT has  appt with provider on Tuesday. Pt is very concerned about high BP and will seek care at ED. Reason for Disposition  [1] MILD swelling of both ankles (i.e., pedal edema) AND [2] new-onset or worsening  Systolic BP  >= 160 OR Diastolic >= 100  Answer Assessment - Initial Assessment Questions 1. ONSET: "When did the swelling start?" (e.g., minutes, hours, days)     Ankles - Tuesday 2. LOCATION: "What part of the leg is swollen?"  "Are both legs swollen or just one leg?"     Ankles 3. SEVERITY: "How bad is the swelling?" (e.g., localized; mild, moderate, severe)  - Localized - small area of swelling localized to one leg  - MILD pedal edema - swelling limited to foot and ankle, pitting edema < 1/4 inch (6 mm) deep, rest and elevation eliminate most or all swelling  - MODERATE edema - swelling of lower leg to knee, pitting edema > 1/4 inch (6 mm) deep, rest and elevation only partially reduce swelling  - SEVERE edema - swelling extends above knee, facial or hand swelling present      mild 4. REDNESS: "Does the swelling look red or infected?"     no 5. PAIN: "Is the swelling painful to touch?" If Yes, ask: "How painful is it?"   (Scale 1-10; mild, moderate or severe)     no 6. FEVER: "Do you have a fever?" If Yes, ask: "What is it, how was it measured, and when did it start?"      no 7. CAUSE: "What do you think is causing the leg  swelling?"     Unsure 8. MEDICAL HISTORY: "Do you have a history of heart failure, kidney disease, liver failure, or cancer?"     HTN 9. RECURRENT SYMPTOM: "Have you had leg swelling before?" If Yes, ask: "When was the last time?" "What happened that time?"     *No Answer* 10. OTHER SYMPTOMS: "Do you have any other symptoms?" (e.g., chest pain, difficulty breathing)       *No Answer* 11. PREGNANCY: "Is there any chance you are pregnant?" "When was your last menstrual period?"       *No Answer*  Protocols used: Leg Swelling and Edema-A-AH, Blood Pressure - High-A-AH

## 2021-11-18 NOTE — Telephone Encounter (Signed)
Pt stated she was on the phone with Ladona Ridgel and the line got disconnected. BP is high. She is having a reaction to medication.  She is requesting a callback at [phone]

## 2021-11-18 NOTE — ED Triage Notes (Signed)
Patient here for allergic reaction to amlodipine. Patient states she feels like she has swelling in her glands, feeling flushed and having dry mouth. Patient last took dose at 11:15 today. Called PCP and can not get an appt until next week.

## 2021-11-21 ENCOUNTER — Emergency Department (HOSPITAL_BASED_OUTPATIENT_CLINIC_OR_DEPARTMENT_OTHER)
Admission: EM | Admit: 2021-11-21 | Discharge: 2021-11-21 | Disposition: A | Discharge status: Home / Self Care | Payer: Medicare HMO | Attending: Emergency Medicine | Admitting: Emergency Medicine

## 2021-11-21 ENCOUNTER — Other Ambulatory Visit: Payer: Self-pay

## 2021-11-21 ENCOUNTER — Encounter (HOSPITAL_BASED_OUTPATIENT_CLINIC_OR_DEPARTMENT_OTHER): Payer: Self-pay | Admitting: Emergency Medicine

## 2021-11-21 ENCOUNTER — Emergency Department (HOSPITAL_COMMUNITY): Payer: Medicare HMO

## 2021-11-21 DIAGNOSIS — R42 Dizziness and giddiness: Secondary | ICD-10-CM | POA: Diagnosis not present

## 2021-11-21 DIAGNOSIS — F419 Anxiety disorder, unspecified: Secondary | ICD-10-CM | POA: Insufficient documentation

## 2021-11-21 DIAGNOSIS — I10 Essential (primary) hypertension: Secondary | ICD-10-CM | POA: Diagnosis not present

## 2021-11-21 DIAGNOSIS — R519 Headache, unspecified: Secondary | ICD-10-CM | POA: Diagnosis not present

## 2021-11-21 DIAGNOSIS — Z79899 Other long term (current) drug therapy: Secondary | ICD-10-CM | POA: Diagnosis not present

## 2021-11-21 DIAGNOSIS — R69 Illness, unspecified: Secondary | ICD-10-CM | POA: Diagnosis not present

## 2021-11-21 DIAGNOSIS — E876 Hypokalemia: Secondary | ICD-10-CM | POA: Diagnosis not present

## 2021-11-21 LAB — COMPREHENSIVE METABOLIC PANEL
ALT: 18 U/L (ref 0–44)
AST: 20 U/L (ref 15–41)
Albumin: 4.4 g/dL (ref 3.5–5.0)
Alkaline Phosphatase: 47 U/L (ref 38–126)
Anion gap: 8 (ref 5–15)
BUN: 13 mg/dL (ref 8–23)
CO2: 30 mmol/L (ref 22–32)
Calcium: 9.1 mg/dL (ref 8.9–10.3)
Chloride: 105 mmol/L (ref 98–111)
Creatinine, Ser: 0.73 mg/dL (ref 0.44–1.00)
GFR, Estimated: >60 mL/min (ref >60)
Glucose, Bld: 99 mg/dL (ref 70–99)
Potassium: 3.3 mmol/L — ABNORMAL LOW (ref 3.5–5.1)
Sodium: 143 mmol/L (ref 135–145)
Total Bilirubin: 0.4 mg/dL (ref 0.3–1.2)
Total Protein: 6.8 g/dL (ref 6.5–8.1)

## 2021-11-21 LAB — CBC WITH DIFFERENTIAL/PLATELET
Abs Immature Granulocytes: 0.03 10*3/uL (ref 0.00–0.07)
Basophils Absolute: 0 10*3/uL (ref 0.0–0.1)
Basophils Relative: 1 %
Eosinophils Absolute: 0.2 10*3/uL (ref 0.0–0.5)
Eosinophils Relative: 3 %
HCT: 42.8 % (ref 36.0–46.0)
Hemoglobin: 13.9 g/dL (ref 12.0–15.0)
Immature Granulocytes: 1 %
Lymphocytes Relative: 22 %
Lymphs Abs: 1.4 10*3/uL (ref 0.7–4.0)
MCH: 29.8 pg (ref 26.0–34.0)
MCHC: 32.5 g/dL (ref 30.0–36.0)
MCV: 91.8 fL (ref 80.0–100.0)
Monocytes Absolute: 0.6 10*3/uL (ref 0.1–1.0)
Monocytes Relative: 10 %
Neutro Abs: 4.2 10*3/uL (ref 1.7–7.7)
Neutrophils Relative %: 63 %
Platelets: 287 10*3/uL (ref 150–400)
RBC: 4.66 MIL/uL (ref 3.87–5.11)
RDW: 13.2 % (ref 11.5–15.5)
WBC: 6.5 10*3/uL (ref 4.0–10.5)
nRBC: 0 % (ref 0.0–0.2)

## 2021-11-21 LAB — TROPONIN I (HIGH SENSITIVITY): Troponin I (High Sensitivity): 4 ng/L (ref <18)

## 2021-11-21 LAB — URINALYSIS, ROUTINE W REFLEX MICROSCOPIC
Bilirubin Urine: NEGATIVE
Glucose, UA: NEGATIVE mg/dL
Hgb urine dipstick: NEGATIVE
Ketones, ur: NEGATIVE mg/dL
Leukocytes,Ua: NEGATIVE
Nitrite: NEGATIVE
Protein, ur: NEGATIVE mg/dL
Specific Gravity, Urine: 1.007 (ref 1.005–1.030)
pH: 8 (ref 5.0–8.0)

## 2021-11-21 MED ORDER — POTASSIUM CHLORIDE CRYS ER 20 MEQ PO TBCR
40.0000 meq | EXTENDED_RELEASE_TABLET | Freq: Once | ORAL | Status: AC
  Administered 2021-11-21: 40 meq via ORAL
  Filled 2021-11-21: qty 2

## 2021-11-21 MED ORDER — POTASSIUM CHLORIDE CRYS ER 20 MEQ PO TBCR: 30.0000 meq | EXTENDED_RELEASE_TABLET | Freq: Once | ORAL | Status: DC

## 2021-11-21 MED ORDER — HYDRALAZINE HCL 20 MG/ML IJ SOLN
5.0000 mg | Freq: Once | INTRAMUSCULAR | Status: AC
  Administered 2021-11-21: 5 mg via INTRAVENOUS
  Filled 2021-11-21: qty 1

## 2021-11-21 MED ORDER — LORAZEPAM 1 MG PO TABS
1.0000 mg | ORAL_TABLET | Freq: Once | ORAL | Status: AC
  Administered 2021-11-21: 1 mg via ORAL
  Filled 2021-11-21: qty 1

## 2021-11-21 NOTE — ED Provider Notes (Addendum)
MEDCENTER [facility] EMERGENCY DEPT Provider Note   CSN: [id number] Arrival date & time: 11/21/21  1036     History Chief Complaint  Patient presents with   Dizziness    Jasmine Esparza is a 81 y.o. female newly diagnosed hypertension presents the emergency department for evaluation of ongoing dizziness/lightheadedness.  Patient has been seen here 3 times over the past 5 days for similar symptoms.  Patient reports that this sensation is constant and has not worsened or improved.  She reports that it is worsened upon standing.  She denies any chest pain or shortness of breath.  She reports some possible visual changes, but denies any blurry vision.  She denies any trouble walking or any trouble talking.  The patient reports that she was originally on amlodipine and felt that it made her salivary glands swollen and she did not like the way it made her feel.  On her last ER visit, she was switched to lisinopril 5 mg which she reports that her glands are less swollen although she still not feeling better and her blood pressure has not improved.   Dizziness Associated symptoms: headaches   Associated symptoms: no chest pain, no nausea, no shortness of breath and no vomiting        Home Medications Prior to Admission medications   Medication Sig Start Date End Date Taking? Authorizing Provider  Calcium Carbonate-Vitamin D 600-400 MG-UNIT tablet Take 1 tablet by mouth 2 (two) times daily.    [provider]  lisinopril (ZESTRIL) 5 MG tablet Take 1 tablet (5 mg total) by mouth daily. 11/18/21 12/18/21  Alvira Monday, MD  meloxicam (MOBIC) 15 MG tablet Take 1 tablet (15 mg total) by mouth daily as needed for pain. 11/03/21   Corwin Levins, MD  polyethylene glycol (MIRALAX / GLYCOLAX) 17 g packet Take 17 g by mouth daily.    [provider]      Allergies    Codeine    Review of Systems   Review of Systems  Constitutional:  Negative for chills and fever.  HENT:   Negative for congestion and sore throat.   Respiratory:  Negative for shortness of breath.   Cardiovascular:  Negative for chest pain.  Gastrointestinal:  Negative for abdominal pain, nausea and vomiting.  Genitourinary:  Negative for dysuria and hematuria.  Neurological:  Positive for dizziness, light-headedness and headaches.    Physical Exam Updated Vital Signs BP (!) 145/75 (BP Location: Left Arm)   Pulse 93   Temp 98.6 F (37 C) (Oral)   Resp 18   SpO2 99%  Physical Exam Vitals and nursing note reviewed.  Constitutional:      General: She is not in acute distress.    Appearance: She is not ill-appearing or toxic-appearing.     Comments: Slightly anxious appearing  HENT:     Head: Normocephalic and atraumatic.  Eyes:     General: No scleral icterus. Neck:     Comments: No cervical lymphadenopathy palpated.  Normal range of motion of her neck. Cardiovascular:     Rate and Rhythm: Normal rate and regular rhythm.  Pulmonary:     Effort: Pulmonary effort is normal. No respiratory distress.     Breath sounds: Normal breath sounds.  Musculoskeletal:        General: No deformity.     Cervical back: Normal range of motion.  Lymphadenopathy:     Cervical: No cervical adenopathy.  Skin:    General: Skin is warm and  dry.  Neurological:     General: No focal deficit present.     Mental Status: She is alert. Mental status is at baseline.     GCS: GCS eye subscore is 4. GCS verbal subscore is 5. GCS motor subscore is 6.     Cranial Nerves: Cranial nerves 2-12 are intact. No dysarthria or facial asymmetry.     Sensory: No sensory deficit.     Motor: No weakness or pronator drift.     Coordination: Finger-Nose-Finger Test normal.     Gait: Gait normal.     Comments: GCS 15.  Cranial nerves II through XII intact.  No dysarthria or any facial asymmetry.  Sensation intact throughout.  Patient has 5 out of 5 strength in upper and lower bilateral extremities.  Normal finger-nose.   Gait normal.  No pronator drift.  Patient is answering questions appropriately with appropriate speech.     ED Results / Procedures / Treatments   Labs (all labs ordered are listed, but only abnormal results are displayed) Labs Reviewed  COMPREHENSIVE METABOLIC PANEL - Abnormal; Notable for the following components:      Result Value   Potassium 3.3 (*)    All other components within normal limits  URINALYSIS, ROUTINE W REFLEX MICROSCOPIC - Abnormal; Notable for the following components:   Color, Urine COLORLESS (*)    All other components within normal limits  CBC WITH DIFFERENTIAL/PLATELET  TROPONIN I (HIGH SENSITIVITY)    EKG EKG Interpretation  Date/Time:  Sunday November 21 2021 11:23:46 EDT Ventricular Rate:  72 PR Interval:  126 QRS Duration: 101 QT Interval:  412 QTC Calculation: 451 R Axis:   59 Text Interpretation: Sinus rhythm No significant change since prior 6/23 Confirmed by Meridee Score [phone]) on 11/21/2021 11:31:10 AM  Radiology No results found.  Procedures Procedures   Medications Ordered in ED Medications  potassium chloride SA (KLOR-CON M) CR tablet 40 mEq (has no administration in time range)  hydrALAZINE (APRESOLINE) injection 5 mg (5 mg Intravenous Given 11/21/21 1447)    ED Course/ Medical Decision Making/ A&P Clinical Course as of 11/21/21 1739  Sun Nov 21, 2021  8623 81 year old female brought in by her husband for evaluation of dizziness lightheadedness.  She is also noticed her heart feeling like it is having palpitations.  The symptoms been going on for a few weeks and she has had 2 ED visits for same.  Continues to have elevated blood pressures.  Has not been able to follow-up with primary care doctor.  She is neuro intact anxious appearing.  Getting screening labs and consideration for MRI brain.  Otherwise can follow-up outpatient with her treatment team. [MB]    Clinical Course User Index [MB] Terrilee Files, MD                            Medical Decision Making Amount and/or Complexity of Data Reviewed Labs: ordered. Radiology: ordered.  Risk Prescription drug management.   81 year old female presents emerged department for evaluation of elevated blood pressure as well as some dizziness/lightheadedness for the past 5 days has been constant.  Differential diagnosis includes but is not limited to vertigo, stroke, TIA, complex migraine, hypertensive urgency, hypertensive emergency, electrolyte abnormality, UTI.  Vital signs initially showed blood pressures in the 190s over 80s otherwise normal heart rate, satting well room air without increased work of breathing.  Afebrile.  Physical exam shows an anxious appearing patient otherwise no  acute distress.  Regular rate and rhythm.  Lungs are clear to auscultation bilaterally.  She is a benign neurological exam and has a normal gait.  This the patient's third visit in 5 days for the similar complaints.  She has tried amlodipine as well as lisinopril and so reports that these are not helping her blood pressure.  She does have an appointment on 6-27 although would like for [place] to find out why she is having high blood pressure and also this dizziness and lightheadedness.  She had a CTA of her head and neck on 11-16-2021 without occlusion or ICH.  She did chest x-ray without acute abnormalities as well as a UA without infection and normal troponins.  Given the patient's borderline bradycardic heart rate, decided to give the patient 5 mg of hydralazine.  Patient's blood pressure improved and she is now 133/60.  My attending attempted this note assessed the patient at bedside and discussed with her the possibility of going to [facility] for an MRI.  Patient like to think about this.  We will order screening labs in the meantime.  I independently reviewed and interpreted the patient's labs.  Urinalysis shows colorless urine otherwise normal.  CBC shows no leukocytosis or anemia.  Troponin at  4.  CMP shows mild decrease in potassium at 3.3 which is slightly better than her lower potassium 3.1 a few days prior.  No other LFT or electrolyte abnormalities.  We will replace her potassium with 40 mill equivalents.  I discussed with the patient and husband at bedside following up with neurology outpatient or getting MRI at [facility].  They have decided to be transferred to [facility] via personal vehicle.  Patient is stable for transfer.  Accepting physician is Dr. Durwin Nora at [facility].   If the patient's MRI is normal, per neurology, she can follow up with vestibular physical therapy and follow-up with neurology outpatient.  Patient has a primary care appointment on 6-27.  I discussed with the patient that they should head to [facility] emergency department for an MRI.  Patient is going via personal vehicle as her husband is driving her.  I discussed this case with my attending physician who cosigned this note including patient's presenting symptoms, physical exam, and planned diagnostics and interventions. Attending physician stated agreement with plan or made changes to plan which were implemented.   Attending physician assessed patient at bedside.   Final Clinical Impression(s) / ED Diagnoses Final diagnoses:  None    Rx / DC Orders ED Discharge Orders     None         Achille Rich, PA-C 11/21/21 1740    Achille Rich, PA-C 11/21/21 1740    Terrilee Files, MD 11/21/21 1747

## 2021-11-21 NOTE — ED Triage Notes (Signed)
Pt reports being here 3 times in past few weeks, no improvement . Pt was prescribed lisinopril and has started 2 days ago.

## 2021-11-23 ENCOUNTER — Encounter: Payer: Self-pay | Admitting: Internal Medicine

## 2021-11-23 ENCOUNTER — Ambulatory Visit (INDEPENDENT_AMBULATORY_CARE_PROVIDER_SITE_OTHER): Payer: Medicare HMO | Admitting: Internal Medicine

## 2021-11-23 DIAGNOSIS — R030 Elevated blood-pressure reading, without diagnosis of hypertension: Secondary | ICD-10-CM | POA: Diagnosis not present

## 2021-11-23 DIAGNOSIS — R42 Dizziness and giddiness: Secondary | ICD-10-CM | POA: Diagnosis not present

## 2021-11-23 MED ORDER — LISINOPRIL 5 MG PO TABS: 5.0000 mg | ORAL_TABLET | Freq: Every day | ORAL | 0 refills | Status: DC

## 2021-11-29 ENCOUNTER — Encounter: Payer: Self-pay | Admitting: Family Medicine

## 2021-11-29 ENCOUNTER — Ambulatory Visit (INDEPENDENT_AMBULATORY_CARE_PROVIDER_SITE_OTHER): Payer: Medicare HMO | Admitting: Family Medicine

## 2021-11-29 VITALS — BP 130/84 | HR 75 | Temp 97.8°F | Ht 61.0 in | Wt 121.0 lb

## 2021-11-29 DIAGNOSIS — R1011 Right upper quadrant pain: Secondary | ICD-10-CM | POA: Diagnosis not present

## 2021-11-29 LAB — POCT URINALYSIS DIPSTICK
Bilirubin, UA: NEGATIVE
Blood, UA: NEGATIVE
Glucose, UA: NEGATIVE
Ketones, UA: NEGATIVE
Leukocytes, UA: NEGATIVE
Nitrite, UA: NEGATIVE
Protein, UA: POSITIVE — AB
Spec Grav, UA: 1.02 (ref 1.010–1.025)
Urobilinogen, UA: 0.2 E.U./dL
pH, UA: 6 (ref 5.0–8.0)

## 2021-11-29 NOTE — Patient Instructions (Signed)
Try Tylenol 500 mg to 1,000 mg twice daily and using a heating pad to see if this helps.   If you develop any new or worsening symptoms, let us know.   Follow up as scheduled with Dr. Okey Dupre in July.

## 2021-11-29 NOTE — Progress Notes (Unsigned)
Subjective:     Patient ID: Jasmine Esparza, female    DOB: 05-Jul-1940, 81 y.o.   MRN: [id number]  Chief Complaint  Patient presents with   Abdominal Pain    Right sided pain, notes that this was the same pain she had last time she had a bladder infection, states this is what she thinks is going on    HPI Patient is in today for worsening RUQ  pain for the past 4 days. Pain is not worse with eating or movement. Normal appetite. Denies urinary frequency, urgency, dysuria.   No fever, chills, chest pain, shortness of breath, nausea, vomiting or diarrhea. Having 2 small bowel movements daily. No blood.   CT abdomen/pelvis results:  IMPRESSION: 1. No acute findings within the abdomen or pelvis. No evidence for bowel obstruction. 2. Mild circumferential wall thickening of the urinary bladder. Correlate for any clinical signs or symptoms of cystitis. 3. Colonic diverticulosis without signs of acute inflammation. 4. Increased parametrial veins bilaterally which may reflect underlying pelvic venous congestion. 5. Mild stool burden identified throughout the colon. Correlate for any clinical signs or symptoms of constipation. 6. Aortic Atherosclerosis (ICD10-I70.0).     Electronically Signed   By: Signa Kell M.D.   On: 11/02/2021 07:07  Health Maintenance Due  Topic Date Due   Hepatitis C Screening  Never done   Zoster Vaccines- Shingrix (1 of 2) Never done   COVID-19 Vaccine (4 - Pfizer series) 08/23/2021    Past Medical History:  Diagnosis Date   ALLERGIC RHINITIS    ECZEMA    GOITER, MULTINODULAR    s/p I-131 ablation 10/2009   Hypercalcemia    HYPERLIPIDEMIA    Hypertension    OSTEOPOROSIS     Past Surgical History:  Procedure Laterality Date   CATARACT EXTRACTION W/ INTRAOCULAR LENS IMPLANT Bilateral 2013    Family History  Problem Relation Age of Onset   Stroke Mother    Arthritis Mother    Hypertension Mother    Prostate cancer Father    Arthritis  Father    Hypertension Sister    Osteoporosis Sister    Hypertension Brother    Hyperlipidemia Brother     Social History   Socioeconomic History   Marital status: Married    Spouse name: Not on file   Number of children: 3   Years of education: Not on file   Highest education level: Not on file  Occupational History   Not on file  Tobacco Use   Smoking status: Never    Passive exposure: Never   Smokeless tobacco: Never   Tobacco comments:    Married, lives with spouse. retired  Building services engineer Use: Never used  Substance and Sexual Activity   Alcohol use: No   Drug use: No   Sexual activity: Not Currently  Other Topics Concern   Not on file  Social History Narrative   Not on file   Social Determinants of Health   Financial Resource Strain: Low Risk  (11/23/2017)   Overall Financial Resource Strain (CARDIA)    Difficulty of Paying Living Expenses: Not hard at all  Food Insecurity: No Food Insecurity (11/23/2017)   Hunger Vital Sign    Worried About Running Out of Food in the Last Year: Never true    Ran Out of Food in the Last Year: Never true  Transportation Needs: No Transportation Needs (11/23/2017)   PRAPARE - Administrator, Civil Service (Medical):  No    Lack of Transportation (Non-Medical): No  Physical Activity: Sufficiently Active (11/23/2017)   Exercise Vital Sign    Days of Exercise per Week: 5 days    Minutes of Exercise per Session: 40 min  Stress: No Stress Concern Present (11/23/2017)   [employer] of Occupational Health - Occupational Stress Questionnaire    Feeling of Stress : Not at all  Social Connections: Socially Integrated (11/23/2017)   Social Connection and Isolation Panel [NHANES]    Frequency of Communication with Friends and Family: More than three times a week    Frequency of Social Gatherings with Friends and Family: More than three times a week    Attends Religious Services: More than 4 times per year    Active  Member of [employer] or Organizations: Yes    Attends Engineer, structural: More than 4 times per year    Marital Status: Married  Catering manager Violence: Not At Risk (11/23/2017)   Humiliation, Afraid, Rape, and Kick questionnaire    Fear of Current or Ex-Partner: No    Emotionally Abused: No    Physically Abused: No    Sexually Abused: No    Outpatient Medications Prior to Visit  Medication Sig Dispense Refill   lisinopril (ZESTRIL) 5 MG tablet Take 1 tablet (5 mg total) by mouth daily. 90 tablet 0   No facility-administered medications prior to visit.    Allergies  Allergen Reactions   Codeine     REACTION: nausea    ROS Pertinent positives and negatives in the history of present illness.     Objective:    Physical Exam Constitutional:      General: She is not in acute distress.    Appearance: She is not ill-appearing.  HENT:     Mouth/Throat:     Mouth: Mucous membranes are moist.  Eyes:     General: No scleral icterus. Cardiovascular:     Rate and Rhythm: Normal rate and regular rhythm.  Pulmonary:     Effort: Pulmonary effort is normal.     Breath sounds: Normal breath sounds.  Abdominal:     General: Bowel sounds are normal. There is no distension.     Palpations: Abdomen is soft. There is no hepatomegaly, splenomegaly or mass.     Tenderness: There is generalized abdominal tenderness. There is no right CVA tenderness, left CVA tenderness, guarding or rebound. Negative signs include Murphy's sign, Rovsing's sign and McBurney's sign.  Skin:    General: Skin is warm and dry.     Capillary Refill: Capillary refill takes less than 2 seconds.     Findings: No rash.  Neurological:     General: No focal deficit present.     Mental Status: She is alert and oriented to person, place, and time.     Cranial Nerves: No cranial nerve deficit.     Motor: No weakness.  Psychiatric:        Mood and Affect: Mood normal.        Behavior: Behavior normal.      BP 130/84 (BP Location: Left Arm, Patient Position: Sitting, Cuff Size: Large)   Pulse 75   Temp 97.8 F (36.6 C) (Temporal)   Ht 5\' 1"  (1.549 m)   Wt 121 lb (54.9 kg)   SpO2 98%   BMI 22.86 kg/m  Wt Readings from Last 3 Encounters:  11/29/21 121 lb (54.9 kg)  11/23/21 120 lb 12.8 oz (54.8 kg)  11/18/21 123 lb 3.8  oz (55.9 kg)       Assessment & Plan:   Problem List Items Addressed This Visit       Other   RUQ abdominal pain - Primary    Unclear etiology. No red flag symptoms.  UA dipstick unremarkable. Exam benign. I ordered a RUQ [place] at her previous visit for RUQ pain but this was not done. She was evaluated in the ED however.  Reviewed ED notes and CT abdomen/pelvis from 11/02/2021 Discussed trying Tylenol and heating pad. Close follow up if any new or worsening symptoms. Consider GI referral if not resolving.       Relevant Orders   POCT urinalysis dipstick (Completed)    I am having [name]. Jinkins maintain her lisinopril.  No orders of the defined types were placed in this encounter.

## 2021-12-02 NOTE — Assessment & Plan Note (Addendum)
Unclear etiology. No red flag symptoms.  UA dipstick unremarkable. Exam benign. I ordered a RUQ [place] at her previous visit for RUQ pain but this was not done. She was evaluated in the ED however.  Reviewed ED notes and CT abdomen/pelvis from 11/02/2021 Discussed trying Tylenol and heating pad. Close follow up if any new or worsening symptoms. Consider GI referral if not resolving.

## 2021-12-12 ENCOUNTER — Emergency Department (HOSPITAL_BASED_OUTPATIENT_CLINIC_OR_DEPARTMENT_OTHER)
Admission: EM | Admit: 2021-12-12 | Discharge: 2021-12-12 | Disposition: A | Discharge status: Home / Self Care | Payer: Medicare HMO | Attending: Emergency Medicine | Admitting: Emergency Medicine

## 2021-12-12 ENCOUNTER — Encounter (HOSPITAL_BASED_OUTPATIENT_CLINIC_OR_DEPARTMENT_OTHER): Payer: Self-pay | Admitting: Emergency Medicine

## 2021-12-12 ENCOUNTER — Other Ambulatory Visit: Payer: Self-pay

## 2021-12-12 ENCOUNTER — Emergency Department (HOSPITAL_BASED_OUTPATIENT_CLINIC_OR_DEPARTMENT_OTHER): Payer: Medicare HMO

## 2021-12-12 DIAGNOSIS — E041 Nontoxic single thyroid nodule: Secondary | ICD-10-CM | POA: Diagnosis not present

## 2021-12-12 DIAGNOSIS — Z79899 Other long term (current) drug therapy: Secondary | ICD-10-CM | POA: Diagnosis not present

## 2021-12-12 DIAGNOSIS — I10 Essential (primary) hypertension: Secondary | ICD-10-CM | POA: Insufficient documentation

## 2021-12-12 DIAGNOSIS — E876 Hypokalemia: Secondary | ICD-10-CM | POA: Insufficient documentation

## 2021-12-12 DIAGNOSIS — R1032 Left lower quadrant pain: Secondary | ICD-10-CM | POA: Diagnosis not present

## 2021-12-12 DIAGNOSIS — R079 Chest pain, unspecified: Secondary | ICD-10-CM | POA: Insufficient documentation

## 2021-12-12 DIAGNOSIS — R911 Solitary pulmonary nodule: Secondary | ICD-10-CM | POA: Diagnosis not present

## 2021-12-12 DIAGNOSIS — R1011 Right upper quadrant pain: Secondary | ICD-10-CM | POA: Diagnosis not present

## 2021-12-12 LAB — TROPONIN I (HIGH SENSITIVITY): Troponin I (High Sensitivity): 6 ng/L (ref <18)

## 2021-12-12 LAB — URINALYSIS, ROUTINE W REFLEX MICROSCOPIC
Bilirubin Urine: NEGATIVE
Glucose, UA: NEGATIVE mg/dL
Hgb urine dipstick: NEGATIVE
Ketones, ur: NEGATIVE mg/dL
Leukocytes,Ua: NEGATIVE
Nitrite: NEGATIVE
Protein, ur: NEGATIVE mg/dL
Specific Gravity, Urine: <1.005 — ABNORMAL LOW (ref 1.005–1.030)
pH: 7 (ref 5.0–8.0)

## 2021-12-12 LAB — BASIC METABOLIC PANEL
Anion gap: 8 (ref 5–15)
BUN: 20 mg/dL (ref 8–23)
CO2: 31 mmol/L (ref 22–32)
Calcium: 9.6 mg/dL (ref 8.9–10.3)
Chloride: 104 mmol/L (ref 98–111)
Creatinine, Ser: 0.82 mg/dL (ref 0.44–1.00)
GFR, Estimated: >60 mL/min (ref >60)
Glucose, Bld: 95 mg/dL (ref 70–99)
Potassium: 3.2 mmol/L — ABNORMAL LOW (ref 3.5–5.1)
Sodium: 143 mmol/L (ref 135–145)

## 2021-12-12 LAB — CBC
HCT: 43.5 % (ref 36.0–46.0)
Hemoglobin: 14.4 g/dL (ref 12.0–15.0)
MCH: 30.9 pg (ref 26.0–34.0)
MCHC: 33.1 g/dL (ref 30.0–36.0)
MCV: 93.3 fL (ref 80.0–100.0)
Platelets: 236 10*3/uL (ref 150–400)
RBC: 4.66 MIL/uL (ref 3.87–5.11)
RDW: 13.2 % (ref 11.5–15.5)
WBC: 5.7 10*3/uL (ref 4.0–10.5)
nRBC: 0 % (ref 0.0–0.2)

## 2021-12-12 LAB — HEPATIC FUNCTION PANEL
ALT: 11 U/L (ref 0–44)
AST: 19 U/L (ref 15–41)
Albumin: 4.5 g/dL (ref 3.5–5.0)
Alkaline Phosphatase: 41 U/L (ref 38–126)
Bilirubin, Direct: 0.1 mg/dL (ref 0.0–0.2)
Indirect Bilirubin: 0.3 mg/dL (ref 0.3–0.9)
Total Bilirubin: 0.4 mg/dL (ref 0.3–1.2)
Total Protein: 6.9 g/dL (ref 6.5–8.1)

## 2021-12-12 LAB — CBG MONITORING, ED: Glucose-Capillary: 97 mg/dL (ref 70–99)

## 2021-12-12 LAB — LIPASE, BLOOD: Lipase: 43 U/L (ref 11–51)

## 2021-12-12 MED ORDER — LISINOPRIL 10 MG PO TABS: 10.0000 mg | ORAL_TABLET | Freq: Every day | ORAL | 1 refills | Status: DC

## 2021-12-12 MED ORDER — HYDRALAZINE HCL 20 MG/ML IJ SOLN
10.0000 mg | Freq: Once | INTRAMUSCULAR | Status: AC
  Administered 2021-12-12: 10 mg via INTRAVENOUS
  Filled 2021-12-12: qty 1

## 2021-12-12 MED ORDER — IOHEXOL 350 MG/ML SOLN
100.0000 mL | Freq: Once | INTRAVENOUS | Status: AC | PRN
  Administered 2021-12-12: 80 mL via INTRAVENOUS

## 2021-12-12 MED ORDER — POTASSIUM CHLORIDE CRYS ER 20 MEQ PO TBCR
40.0000 meq | EXTENDED_RELEASE_TABLET | Freq: Once | ORAL | Status: AC
  Administered 2021-12-12: 40 meq via ORAL
  Filled 2021-12-12: qty 2

## 2021-12-12 NOTE — ED Triage Notes (Signed)
Pt states right lower abd pain x 1 month. And High blood pressure x 1 month. Pt has been to ED and PCP multiple times with no change in symptoms. Pt states BP meds were changed 2 weeks ago with no improvement. Pt was placed on antibiotic for UTI a few weeks ago initially felt better after taking but pain has returned. Today pt states she felt really bad and when she check bp it is was 190/97. Pt denies any Chest pain or SOB.

## 2021-12-12 NOTE — ED Notes (Signed)
Patient transported to [facility] 

## 2021-12-12 NOTE — ED Provider Notes (Signed)
Patient care signed out to follow-up blood test and CT scan results and ultrasound with likely plan for outpatient follow-up.  Patient's had uncontrolled high blood pressure in the ED it ranged from 150s to 190s since 2:00.  Blood work results reviewed and reassuring negative troponin, electrolytes unremarkable except for mild low potassium, discussed oral potassium and recheck outpatient.  Patient CT scan no vascular abnormalities, small thyroid nodule which I discussed importance of following up for outpatient ultrasound the next month with primary doctor.  Prescription for increased dose of blood pressure medication prescribed.  Patient well-appearing on exam, mild right lower abdominal tenderness without guarding.  Discussed outpatient follow-up for this and possible colonoscopy or MRI nonemergent.  Patient comfortable this plan.   Blane Ohara, MD 12/12/21 [phone]

## 2021-12-12 NOTE — ED Provider Notes (Signed)
  [facility] EMERGENCY DEPT Provider Note   CSN: [id number] Arrival date & time: 12/12/21  1317     History {Add pertinent medical, surgical, social history, OB history to HPI:1} Chief Complaint  Patient presents with   Abdominal Pain   Hypertension   Weakness   Dizziness    MARGENE CHERIAN is a 81 y.o. female.  HPI     RUQ abdominal pain 6/7 June, gets busy and doesn't notice, it comes and goes. Currently is 3-4/10. Aching pain RUQ, burning, something different.  Sometimes it increases in severity but not very often. Not changed with eating or drinking, not worse with taking a deep breath or moving.  Saw RN at PCP and thought it was muscular strain.  Constant aching and burning over the last month. When it is increasing BP goes up.  Pain a little bit elevated today but not a severe pain. More tired this AM than usual, active last night. No nausea or vomiting, no fever, a little constipation, frequent urination but is also drinking water,  no dysuria (never has). No chest pain or dyspnea.  Denies numbness, weakness, difficulty talking or walking, visual changes or facial droop.        190/97, felt dizzy, automatic machine  On lisinopril 5mg  since 6/25  Home Medications Prior to Admission medications   Medication Sig Start Date End Date Taking? Authorizing Provider  lisinopril (ZESTRIL) 5 MG tablet Take 1 tablet (5 mg total) by mouth daily. 11/23/21   11/25/21, MD      Allergies    Codeine    Review of Systems   Review of Systems  Physical Exam Updated Vital Signs BP (!) 174/86   Pulse 72   Temp 97.8 F (36.6 C)   Resp 15   Ht 5\' 1"  (1.549 m)   Wt 54.9 kg   SpO2 97%   BMI 22.86 kg/m  Physical Exam  ED Results / Procedures / Treatments   Labs (all labs ordered are listed, but only abnormal results are displayed) Labs Reviewed  URINALYSIS, ROUTINE W REFLEX MICROSCOPIC - Abnormal; Notable for the following components:      Result  Value   Color, Urine COLORLESS (*)    Specific Gravity, Urine <1.005 (*)    All other components within normal limits  BASIC METABOLIC PANEL  CBC  CBG MONITORING, ED    EKG None  Radiology No results found.  Procedures Procedures  {Document cardiac monitor, telemetry assessment procedure when appropriate:1}  Medications Ordered in ED Medications - No data to display  ED Course/ Medical Decision Making/ A&P                           Medical Decision Making Amount and/or Complexity of Data Reviewed Labs: ordered.   ***  {Document critical care time when appropriate:1} {Document review of labs and clinical decision tools ie heart score, Chads2Vasc2 etc:1}  {Document your independent review of radiology images, and any outside records:1} {Document your discussion with family members, caretakers, and with consultants:1} {Document social determinants of health affecting pt's care:1} {Document your decision making why or why not admission, treatments were needed:1} Final Clinical Impression(s) / ED Diagnoses Final diagnoses:  None    Rx / DC Orders ED Discharge Orders     None

## 2021-12-12 NOTE — Discharge Instructions (Addendum)
Increase your lisinopril to 10 mg once daily and discuss further management with your primary doctor. Discuss ultrasound of your thyroid with your primary doctor in the next few weeks. Return for new concerns.

## 2021-12-13 ENCOUNTER — Encounter: Payer: Self-pay | Admitting: Family Medicine

## 2021-12-13 ENCOUNTER — Telehealth (INDEPENDENT_AMBULATORY_CARE_PROVIDER_SITE_OTHER): Payer: Medicare HMO | Admitting: Family Medicine

## 2021-12-13 ENCOUNTER — Ambulatory Visit: Payer: Self-pay

## 2021-12-13 ENCOUNTER — Telehealth: Payer: Self-pay

## 2021-12-13 DIAGNOSIS — R519 Headache, unspecified: Secondary | ICD-10-CM

## 2021-12-13 DIAGNOSIS — I10 Essential (primary) hypertension: Secondary | ICD-10-CM | POA: Diagnosis not present

## 2021-12-13 DIAGNOSIS — E041 Nontoxic single thyroid nodule: Secondary | ICD-10-CM | POA: Insufficient documentation

## 2021-12-13 NOTE — Telephone Encounter (Signed)
  Chief Complaint: HTN Symptoms: 186/98 Frequency: ongoing Pertinent Negatives: Patient denies  Disposition: [] ED /[] Urgent Care (no appt availability in office) / [] Appointment(In office/virtual)/ []  [place] [facility]/ [] Home Care/ [] Refused Recommended Disposition /[]  Mobile Bus/ []  Follow-up with PCP Additional Notes: Pt was not with caller. Caller is pt's sister. Caller states that her sister's current BP is 186/98. Reviewed proper technique for taking BP. Caller will take pt's BP when she gets to her in a few minutes, and seek care for pt if needed.   Reason for Disposition  Systolic BP  >= 180 OR Diastolic >= 110  Protocols used: Blood Pressure - High-A-AH

## 2021-12-13 NOTE — Assessment & Plan Note (Signed)
Seen on CT chest done in the ED. May need [place] for follow up.

## 2021-12-13 NOTE — Assessment & Plan Note (Signed)
Try Tylenol 500 mg and if not improving may increase to 1,00 mg bid. Hydrate. MRI brain done 11/21/2021 and negative for acute findings. Follow up as scheduled with PCP in 2 days.

## 2021-12-13 NOTE — Assessment & Plan Note (Signed)
Lisinopril increased from 5 mg to 10 mg yesterday by ED physician. She is concerned about her elevated BPs. Discussed that her BP may improve if we treat her headache since pain may increase BP. She will follow up with Dr. Okey Dupre on 12/15/2021.

## 2021-12-13 NOTE — Telephone Encounter (Signed)
Pt is calling to report an evaluated BP reading 171/85 currently lisinopril (ZESTRIL) 10 MG tablet.  Pt complains of weakness, unsteadiness, headache, dizziness, dry mouth, increase thirst.  Scheduled with Chip Boer, NP [facility]

## 2021-12-13 NOTE — Progress Notes (Signed)
MyChart Video Visit    Virtual Visit via Video Note   This visit type was conducted due to national recommendations for restrictions regarding the COVID-19 Pandemic (e.g. social distancing) in an effort to limit this patient's exposure and mitigate transmission in our community. This patient is at least at moderate risk for complications without adequate follow up. This format is felt to be most appropriate for this patient at this time. Physical exam was limited by quality of the video and audio technology used for the visit. CMA was able to get the patient set up on a video visit.  Patient location: Home. Patient and provider in visit Provider location: Office  I discussed the limitations of evaluation and management by telemedicine and the availability of in person appointments. The patient expressed understanding and agreed to proceed.  Visit Date: 12/13/2021  Today's healthcare provider: Hetty Blend, NP-C     Subjective:    Patient ID: Jasmine Esparza, female    DOB: 1941/01/18, 81 y.o.   MRN: [id number]  No chief complaint on file.   HPI  Complains of headache and elevated BP. Recent visit to the ED and her BP was elevated. The ED provider increased her lisinopril from 5 mg to 10 mg. She started the 10 mg dose yesterday. Has not taken anything for her headache. Pain is bilateral.   States the ED physician told her that she has a thyroid nodule that was detected on CT of her chest and this will need follow up.   States she has a visit with her PCP in 2 days.   Denies fever, chills, chest pain, palpitations, shortness of breath, abdominal pain, N/V/D.     Past Medical History:  Diagnosis Date   ALLERGIC RHINITIS    ECZEMA    GOITER, MULTINODULAR    s/p I-131 ablation 10/2009   Hypercalcemia    HYPERLIPIDEMIA    Hypertension    OSTEOPOROSIS     Past Surgical History:  Procedure Laterality Date   CATARACT EXTRACTION W/ INTRAOCULAR LENS IMPLANT Bilateral 2013     Family History  Problem Relation Age of Onset   Stroke Mother    Arthritis Mother    Hypertension Mother    Prostate cancer Father    Arthritis Father    Hypertension Sister    Osteoporosis Sister    Hypertension Brother    Hyperlipidemia Brother     Social History   Socioeconomic History   Marital status: Married    Spouse name: Not on file   Number of children: 3   Years of education: Not on file   Highest education level: Not on file  Occupational History   Not on file  Tobacco Use   Smoking status: Never    Passive exposure: Never   Smokeless tobacco: Never   Tobacco comments:    Married, lives with spouse. retired  Building services engineer Use: Never used  Substance and Sexual Activity   Alcohol use: No   Drug use: No   Sexual activity: Not Currently  Other Topics Concern   Not on file  Social History Narrative   Not on file   Social Determinants of Health   Financial Resource Strain: Low Risk  (11/23/2017)   Overall Financial Resource Strain (CARDIA)    Difficulty of Paying Living Expenses: Not hard at all  Food Insecurity: No Food Insecurity (11/23/2017)   Hunger Vital Sign    Worried About Running Out of Food in the  Last Year: Never true    Ran Out of Food in the Last Year: Never true  Transportation Needs: No Transportation Needs (11/23/2017)   PRAPARE - Administrator, Civil Service (Medical): No    Lack of Transportation (Non-Medical): No  Physical Activity: Sufficiently Active (11/23/2017)   Exercise Vital Sign    Days of Exercise per Week: 5 days    Minutes of Exercise per Session: 40 min  Stress: No Stress Concern Present (11/23/2017)   [employer] of Occupational Health - Occupational Stress Questionnaire    Feeling of Stress : Not at all  Social Connections: Socially Integrated (11/23/2017)   Social Connection and Isolation Panel [NHANES]    Frequency of Communication with Friends and Family: More than three times a week     Frequency of Social Gatherings with Friends and Family: More than three times a week    Attends Religious Services: More than 4 times per year    Active Member of [employer] or Organizations: Yes    Attends Engineer, structural: More than 4 times per year    Marital Status: Married  Catering manager Violence: Not At Risk (11/23/2017)   Humiliation, Afraid, Rape, and Kick questionnaire    Fear of Current or Ex-Partner: No    Emotionally Abused: No    Physically Abused: No    Sexually Abused: No    Outpatient Medications Prior to Visit  Medication Sig Dispense Refill   lisinopril (ZESTRIL) 10 MG tablet Take 1 tablet (10 mg total) by mouth daily. 30 tablet 1   No facility-administered medications prior to visit.    Allergies  Allergen Reactions   Codeine     REACTION: nausea    ROS     Objective:    Physical Exam Constitutional:      General: She is not in acute distress.    Appearance: She is not ill-appearing.  Pulmonary:     Effort: Pulmonary effort is normal.  Neurological:     Mental Status: She is alert and oriented to person, place, and time.     Cranial Nerves: No facial asymmetry.  Psychiatric:        Mood and Affect: Mood normal.        Speech: Speech normal.        Behavior: Behavior normal.     There were no vitals taken for this visit. Wt Readings from Last 3 Encounters:  12/12/21 121 lb (54.9 kg)  11/29/21 121 lb (54.9 kg)  11/23/21 120 lb 12.8 oz (54.8 kg)       Assessment & Plan:   Problem List Items Addressed This Visit       Cardiovascular and Mediastinum   Hypertension    Lisinopril increased from 5 mg to 10 mg yesterday by ED physician. She is concerned about her elevated BPs. Discussed that her BP may improve if we treat her headache since pain may increase BP. She will follow up with Dr. Okey Dupre on 12/15/2021.         Endocrine   Thyroid nodule    Seen on CT chest done in the ED. May need [place] for follow up.         Other    Acute nonintractable headache - Primary    Try Tylenol 500 mg and if not improving may increase to 1,00 mg bid. Hydrate. MRI brain done 11/21/2021 and negative for acute findings. Follow up as scheduled with PCP in 2 days.  I am having [name]. Deveny maintain her lisinopril.  No orders of the defined types were placed in this encounter.   I discussed the assessment and treatment plan with the patient. The patient was provided an opportunity to ask questions and all were answered. The patient agreed with the plan and demonstrated an understanding of the instructions.   The patient was advised to call back or seek an in-person evaluation if the symptoms worsen or if the condition fails to improve as anticipated.  I provided 19 minutes of face-to-face time during this encounter.   Hetty Blend, NP-C [employer] [facility] [place] [phone] (phone) [phone] (fax)  [facility]

## 2021-12-15 ENCOUNTER — Ambulatory Visit: Payer: Medicare HMO | Admitting: Internal Medicine

## 2021-12-15 ENCOUNTER — Ambulatory Visit (INDEPENDENT_AMBULATORY_CARE_PROVIDER_SITE_OTHER): Payer: Medicare HMO | Admitting: Internal Medicine

## 2021-12-15 ENCOUNTER — Encounter: Payer: Self-pay | Admitting: Internal Medicine

## 2021-12-15 ENCOUNTER — Telehealth: Payer: Medicare HMO | Admitting: Family Medicine

## 2021-12-15 VITALS — BP 142/88 | HR 62 | Resp 18 | Ht 61.0 in

## 2021-12-15 DIAGNOSIS — R519 Headache, unspecified: Secondary | ICD-10-CM | POA: Diagnosis not present

## 2021-12-15 DIAGNOSIS — R030 Elevated blood-pressure reading, without diagnosis of hypertension: Secondary | ICD-10-CM

## 2021-12-15 DIAGNOSIS — G44201 Tension-type headache, unspecified, intractable: Secondary | ICD-10-CM

## 2021-12-15 DIAGNOSIS — R1011 Right upper quadrant pain: Secondary | ICD-10-CM | POA: Diagnosis not present

## 2021-12-15 DIAGNOSIS — E041 Nontoxic single thyroid nodule: Secondary | ICD-10-CM | POA: Diagnosis not present

## 2021-12-15 LAB — SEDIMENTATION RATE: Sed Rate: 11 mm/hr (ref 0–30)

## 2021-12-15 MED ORDER — KETOROLAC TROMETHAMINE 30 MG/ML IJ SOLN
30.0000 mg | Freq: Once | INTRAMUSCULAR | Status: AC
  Administered 2021-12-15: 30 mg via INTRAMUSCULAR

## 2021-12-15 MED ORDER — TRAMADOL HCL 50 MG PO TABS: 50.0000 mg | ORAL_TABLET | Freq: Three times a day (TID) | ORAL | 0 refills | Status: AC | PRN

## 2021-12-15 NOTE — Progress Notes (Signed)
   Subjective:   Patient ID: Jasmine Esparza, female    DOB: 12-Apr-1941, 81 y.o.   MRN: [id number]  HPI The patient is a 81 YO female with complicated recent history. Coming in urgently today with headache worse than recent, weakness. She has been seen at our office and ER multiple times in the last month for health concerns. Recent ER visit with CT and [place] and labs reviewed with patient and husband. Taking lisinopril 10 mg daily for <1 week and took 2 this morning. Severe headache.   PMH, [facility], social history reviewed and updated.   Review of Systems  Constitutional:  Positive for activity change, appetite change and fatigue.  HENT: Negative.    Eyes: Negative.   Respiratory:  Negative for cough, chest tightness and shortness of breath.   Cardiovascular:  Negative for chest pain, palpitations and leg swelling.  Gastrointestinal:  Negative for abdominal distention, abdominal pain, constipation, diarrhea, nausea and vomiting.  Musculoskeletal: Negative.   Skin: Negative.   Neurological:  Positive for headaches.  Psychiatric/Behavioral: Negative.      Objective:  Physical Exam Constitutional:      Appearance: She is well-developed. She is ill-appearing.  HENT:     Head: Normocephalic and atraumatic.  Cardiovascular:     Rate and Rhythm: Normal rate and regular rhythm.  Pulmonary:     Effort: Pulmonary effort is normal. No respiratory distress.     Breath sounds: Normal breath sounds. No wheezing or rales.  Abdominal:     General: Bowel sounds are normal. There is no distension.     Palpations: Abdomen is soft.     Tenderness: There is no abdominal tenderness. There is no rebound.  Musculoskeletal:        General: Tenderness present.     Cervical back: Normal range of motion.  Skin:    General: Skin is warm and dry.  Neurological:     Mental Status: She is alert and oriented to person, place, and time.     Coordination: Coordination normal.     Vitals:   12/15/21 0928  12/15/21 0952 12/15/21 1034  BP: (!) 210/100 (!) 150/100 (!) 142/88  Pulse:  79 62  Resp:  18   SpO2: 98% 98% 97%  Height: 5\' 1"  (1.549 m)      Assessment & Plan:  Toradol 30 mg IM given at visit

## 2021-12-15 NOTE — Patient Instructions (Signed)
We will check the lab today and have sent in tramadol to use up to 3 times a day as needed for headache.   It is still okay to use tylenol for headache up to 1000 mg twice a day even on top of the tramadol.  Keep taking lisinopril 10 mg daily and monitor the blood pressure twice a day.   If the weakness if worse or headache you cannot stop okay to go to ER.

## 2021-12-16 ENCOUNTER — Ambulatory Visit
Admission: RE | Admit: 2021-12-16 | Discharge: 2021-12-16 | Disposition: A | Discharge status: Home / Self Care | Payer: Medicare HMO | Source: Ambulatory Visit | Attending: Internal Medicine | Admitting: Internal Medicine

## 2021-12-16 ENCOUNTER — Emergency Department (HOSPITAL_BASED_OUTPATIENT_CLINIC_OR_DEPARTMENT_OTHER): Payer: Medicare HMO

## 2021-12-16 ENCOUNTER — Encounter (HOSPITAL_BASED_OUTPATIENT_CLINIC_OR_DEPARTMENT_OTHER): Payer: Self-pay

## 2021-12-16 ENCOUNTER — Observation Stay (HOSPITAL_BASED_OUTPATIENT_CLINIC_OR_DEPARTMENT_OTHER)
Admission: EM | Admit: 2021-12-16 | Discharge: 2021-12-18 | Disposition: A | Discharge status: Home / Self Care | Payer: Medicare HMO | Attending: Family Medicine | Admitting: Family Medicine

## 2021-12-16 ENCOUNTER — Other Ambulatory Visit: Payer: Self-pay

## 2021-12-16 DIAGNOSIS — E042 Nontoxic multinodular goiter: Secondary | ICD-10-CM | POA: Diagnosis not present

## 2021-12-16 DIAGNOSIS — R030 Elevated blood-pressure reading, without diagnosis of hypertension: Secondary | ICD-10-CM

## 2021-12-16 DIAGNOSIS — R519 Headache, unspecified: Secondary | ICD-10-CM | POA: Diagnosis not present

## 2021-12-16 DIAGNOSIS — I160 Hypertensive urgency: Principal | ICD-10-CM | POA: Insufficient documentation

## 2021-12-16 DIAGNOSIS — I10 Essential (primary) hypertension: Secondary | ICD-10-CM | POA: Diagnosis not present

## 2021-12-16 DIAGNOSIS — E041 Nontoxic single thyroid nodule: Secondary | ICD-10-CM

## 2021-12-16 DIAGNOSIS — E876 Hypokalemia: Secondary | ICD-10-CM | POA: Diagnosis not present

## 2021-12-16 LAB — COMPREHENSIVE METABOLIC PANEL
ALT: 14 U/L (ref 0–44)
AST: 30 U/L (ref 15–41)
Albumin: 4.6 g/dL (ref 3.5–5.0)
Alkaline Phosphatase: 49 U/L (ref 38–126)
Anion gap: 17 — ABNORMAL HIGH (ref 5–15)
BUN: 15 mg/dL (ref 8–23)
CO2: 22 mmol/L (ref 22–32)
Calcium: 9.7 mg/dL (ref 8.9–10.3)
Chloride: 102 mmol/L (ref 98–111)
Creatinine, Ser: 0.88 mg/dL (ref 0.44–1.00)
GFR, Estimated: >60 mL/min (ref >60)
Glucose, Bld: 103 mg/dL — ABNORMAL HIGH (ref 70–99)
Potassium: 4.3 mmol/L (ref 3.5–5.1)
Sodium: 141 mmol/L (ref 135–145)
Total Bilirubin: 0.5 mg/dL (ref 0.3–1.2)
Total Protein: 6.9 g/dL (ref 6.5–8.1)

## 2021-12-16 LAB — URINALYSIS, ROUTINE W REFLEX MICROSCOPIC
Bilirubin Urine: NEGATIVE
Glucose, UA: NEGATIVE mg/dL
Hgb urine dipstick: NEGATIVE
Ketones, ur: NEGATIVE mg/dL
Nitrite: NEGATIVE
Protein, ur: NEGATIVE mg/dL
Specific Gravity, Urine: <1.005 — ABNORMAL LOW (ref 1.005–1.030)
pH: 7.5 (ref 5.0–8.0)

## 2021-12-16 LAB — CBC WITH DIFFERENTIAL/PLATELET
Abs Immature Granulocytes: 0.02 10*3/uL (ref 0.00–0.07)
Basophils Absolute: 0 10*3/uL (ref 0.0–0.1)
Basophils Relative: 1 %
Eosinophils Absolute: 0.1 10*3/uL (ref 0.0–0.5)
Eosinophils Relative: 2 %
HCT: 41.4 % (ref 36.0–46.0)
Hemoglobin: 13.8 g/dL (ref 12.0–15.0)
Immature Granulocytes: 0 %
Lymphocytes Relative: 14 %
Lymphs Abs: 0.9 10*3/uL (ref 0.7–4.0)
MCH: 30.5 pg (ref 26.0–34.0)
MCHC: 33.3 g/dL (ref 30.0–36.0)
MCV: 91.6 fL (ref 80.0–100.0)
Monocytes Absolute: 0.7 10*3/uL (ref 0.1–1.0)
Monocytes Relative: 11 %
Neutro Abs: 4.4 10*3/uL (ref 1.7–7.7)
Neutrophils Relative %: 72 %
Platelets: 220 10*3/uL (ref 150–400)
RBC: 4.52 MIL/uL (ref 3.87–5.11)
RDW: 13.1 % (ref 11.5–15.5)
WBC: 6.1 10*3/uL (ref 4.0–10.5)
nRBC: 0 % (ref 0.0–0.2)

## 2021-12-16 MED ORDER — FENTANYL CITRATE PF 50 MCG/ML IJ SOSY
25.0000 ug | PREFILLED_SYRINGE | Freq: Once | INTRAMUSCULAR | Status: AC
  Administered 2021-12-16: 25 ug via INTRAVENOUS
  Filled 2021-12-16: qty 1

## 2021-12-16 MED ORDER — HYDROCHLOROTHIAZIDE 12.5 MG PO TABS: 12.5000 mg | ORAL_TABLET | Freq: Once | ORAL | Status: DC

## 2021-12-16 MED ORDER — LISINOPRIL 10 MG PO TABS: 10.0000 mg | ORAL_TABLET | Freq: Once | ORAL | Status: DC

## 2021-12-16 MED ORDER — LABETALOL HCL 5 MG/ML IV SOLN
10.0000 mg | Freq: Once | INTRAVENOUS | Status: AC
  Administered 2021-12-16: 10 mg via INTRAVENOUS
  Filled 2021-12-16: qty 4

## 2021-12-16 MED ORDER — HYDRALAZINE HCL 20 MG/ML IJ SOLN
10.0000 mg | Freq: Once | INTRAMUSCULAR | Status: AC
  Administered 2021-12-16: 10 mg via INTRAVENOUS
  Filled 2021-12-16: qty 1

## 2021-12-16 NOTE — ED Triage Notes (Addendum)
Pt presents POV from home with ongoing high blood pressure. Per spouse this is her 6th visit for the same. Pt went to her PCP yesterday, they prescribed her a "robust headache" medication.   BP meds have been increased from 49m Lisinopril to 10 mg Lisinopril.  Per last ED note (7/16) pt's BP meds were changed.   Per spouse they have run every test and all come back negative.    BP in triage 179/113  Additional concerns..the patientc c/o headache

## 2021-12-16 NOTE — ED Provider Notes (Signed)
[facility] EMERGENCY DEPT Provider Note   CSN: [id number] Arrival date & time: 12/16/21  1718     History  Chief Complaint  Patient presents with   Hypertension    Jasmine Esparza is a 81 y.o. female.  Jasmine Esparza is a 81 year old female with past medical history of hypertension who presents with elevated blood pressure, headache, lightheadedness, and blurry vision for the past 4 hours.  Patient was recently diagnosed with hypertension and had been escalated to lisinopril 10 mg daily a few days ago.  Patient has had systolic blood pressures over 696 at home and here in the emergency room.  She notes recent fatigue and some episodes of racing heart palpitations without syncope or chest pain. She denies any chest pain, shortness of breath, extremity pain/weakness, nausea, vomiting, abdominal pain, recent illnesses, or change in bowel or bladder habits.   Hypertension Associated symptoms include headaches. Pertinent negatives include no chest pain, no abdominal pain and no shortness of breath.  Jasmine Esparza is a 81 year old female     Home Medications Prior to Admission medications   Medication Sig Start Date End Date Taking? Authorizing Provider  lisinopril (ZESTRIL) 10 MG tablet Take 1 tablet (10 mg total) by mouth daily. 12/12/21   Blane Ohara, MD  traMADol (ULTRAM) 50 MG tablet Take 1 tablet (50 mg total) by mouth every 8 (eight) hours as needed for up to 5 days. 12/15/21 12/20/21  Myrlene Broker, MD      Allergies    Codeine    Review of Systems   Review of Systems  Constitutional:  Positive for fatigue. Negative for chills and fever.  Eyes:  Positive for visual disturbance.  Respiratory:  Negative for cough and shortness of breath.   Cardiovascular:  Positive for palpitations. Negative for chest pain.  Gastrointestinal:  Negative for abdominal pain, constipation, diarrhea, nausea and vomiting.  Endocrine: Negative for polyuria.  Genitourinary:   Negative for decreased urine volume, difficulty urinating, dysuria and hematuria.  Musculoskeletal:  Negative for neck pain.  Neurological:  Positive for dizziness, light-headedness and headaches. Negative for syncope and facial asymmetry.    Physical Exam Updated Vital Signs BP (!) 152/72   Pulse 90   Temp 98.3 F (36.8 C)   Resp 12   SpO2 96%  Physical Exam Vitals reviewed.  Constitutional:      Appearance: She is not toxic-appearing.  Cardiovascular:     Rate and Rhythm: Normal rate and regular rhythm.     Pulses:          Radial pulses are 2+ on the right side and 2+ on the left side.       Dorsalis pedis pulses are 2+ on the right side and 2+ on the left side.  Pulmonary:     Effort: Pulmonary effort is normal.     Breath sounds: Normal breath sounds.  Abdominal:     Palpations: Abdomen is soft.     Tenderness: There is no abdominal tenderness.  Musculoskeletal:        General: No swelling.     Cervical back: Neck supple. No tenderness.     Right lower leg: No edema.     Left lower leg: No edema.  Skin:    Capillary Refill: Capillary refill takes less than 2 seconds.  Neurological:     Mental Status: She is alert and oriented to person, place, and time.     ED Results / Procedures / Treatments   Labs (all  labs ordered are listed, but only abnormal results are displayed) Labs Reviewed  URINALYSIS, ROUTINE W REFLEX MICROSCOPIC - Abnormal; Notable for the following components:      Result Value   Color, Urine COLORLESS (*)    Specific Gravity, Urine <1.005 (*)    Leukocytes,Ua TRACE (*)    All other components within normal limits  COMPREHENSIVE METABOLIC PANEL - Abnormal; Notable for the following components:   Glucose, Bld 103 (*)    Anion gap 17 (*)    All other components within normal limits  CBC WITH DIFFERENTIAL/PLATELET  CBC WITH DIFFERENTIAL/PLATELET    EKG EKG Interpretation  Date/Time:  Thursday December 16 2021 17:59:58 EDT Ventricular Rate:   78 PR Interval:  132 QRS Duration: 88 QT Interval:  406 QTC Calculation: 462 R Axis:   78 Text Interpretation: Normal sinus rhythm Nonspecific ST abnormality Abnormal ECG When compared with ECG of 12-Dec-2021 13:54, PREVIOUS ECG IS PRESENT when compared to prior, more artifact. No STEMI Confirmed by Theda Belfast ([id number]) on 12/16/2021 6:43:18 PM  Radiology CT Head Wo Contrast  Result Date: 12/16/2021 CLINICAL DATA:  Headache EXAM: CT HEAD WITHOUT CONTRAST TECHNIQUE: Contiguous axial images were obtained from the base of the skull through the vertex without intravenous contrast. RADIATION DOSE REDUCTION: This exam was performed according to the departmental dose-optimization program which includes automated exposure control, adjustment of the mA and/or kV according to patient size and/or use of iterative reconstruction technique. COMPARISON:  MRI 11/21/2021 FINDINGS: Brain: No acute territorial infarction, hemorrhage or intracranial mass. Mild chronic small vessel ischemic changes of the white matter. Ventricles are nonenlarged Vascular: No hyperdense vessel or unexpected calcification. Skull: Normal. Negative for fracture or focal lesion. Sinuses/Orbits: No acute finding. Other: None IMPRESSION: 1. No CT evidence for acute intracranial abnormality. 2. Mild chronic small vessel ischemic changes of the white matter Electronically Signed   By: Jasmine Pang M.D.   On: 12/16/2021 20:38    Procedures Procedures    Medications Ordered in ED Medications  lisinopril (ZESTRIL) tablet 10 mg (0 mg Oral Hold 12/16/21 2233)  hydrochlorothiazide (HYDRODIURIL) tablet 12.5 mg (0 mg Oral Hold 12/16/21 2232)  labetalol (NORMODYNE) injection 10 mg (10 mg Intravenous Given 12/16/21 1941)  hydrALAZINE (APRESOLINE) injection 10 mg (10 mg Intravenous Given 12/16/21 2155)  fentaNYL (SUBLIMAZE) injection 25 mcg (25 mcg Intravenous Given 12/16/21 2237)    ED Course/ Medical Decision Making/ A&P                            Medical Decision Making Jasmine Esparza is a 81 year old female with a past medical history of hypertension who presents in hypertensive urgency with systolic blood pressures over 604 and associated headache, dizziness, blurry vision.  She has been evaluated in the emergency setting a few times recently for the same issues and has failed outpatient treatment for her hypertension.  Her work-up included CBC, CMP, urinalysis, EKG, CT of the head and showed no evidence of hemorrhagic stroke or acute cardiac arrhythmia or ischemia at this time.  However she continued to have very elevated blood pressures despite treatment and due to her failure of outpatient treatment I believe she would need admission for further work-up and treatment.  We spoke to the hospitalist, Dr. Toniann Fail, for admission to [facility] for further evaluation and management of hypertensive urgency/emergency.  She was treated with labetalol initially in the ED with mild response and then treated with 10 mg lisinopril and  12.5 mg hydrochlorothiazide with good response and systolic blood pressure in the 150s.  Patient has been accepted for admission to [facility].  Problems Addressed: Acute nonintractable headache, unspecified headache type: acute illness or injury Elevated blood pressure reading: acute illness or injury  Amount and/or Complexity of Data Reviewed Labs: ordered. Decision-making details documented in ED Course. Radiology: ordered and independent interpretation performed. Decision-making details documented in ED Course. ECG/medicine tests: ordered and independent interpretation performed. Decision-making details documented in ED Course.  Risk Prescription drug management. Decision regarding hospitalization.          Final Clinical Impression(s) / ED Diagnoses Final diagnoses:  Elevated blood pressure reading  Acute nonintractable headache, unspecified headache type    Rx / DC Orders ED Discharge Orders      None         Rocky Morel, DO 12/16/21 2327    Tegeler, Canary Brim, MD 12/17/21 [phone]

## 2021-12-17 ENCOUNTER — Encounter (HOSPITAL_BASED_OUTPATIENT_CLINIC_OR_DEPARTMENT_OTHER): Payer: Self-pay | Admitting: Internal Medicine

## 2021-12-17 ENCOUNTER — Telehealth: Payer: Self-pay

## 2021-12-17 DIAGNOSIS — I160 Hypertensive urgency: Secondary | ICD-10-CM

## 2021-12-17 DIAGNOSIS — R519 Headache, unspecified: Secondary | ICD-10-CM

## 2021-12-17 DIAGNOSIS — R030 Elevated blood-pressure reading, without diagnosis of hypertension: Secondary | ICD-10-CM

## 2021-12-17 LAB — CBC WITH DIFFERENTIAL/PLATELET
Abs Immature Granulocytes: 0.02 10*3/uL (ref 0.00–0.07)
Basophils Absolute: 0 10*3/uL (ref 0.0–0.1)
Basophils Relative: 1 %
Eosinophils Absolute: 0.2 10*3/uL (ref 0.0–0.5)
Eosinophils Relative: 2 %
HCT: 43.5 % (ref 36.0–46.0)
Hemoglobin: 14.5 g/dL (ref 12.0–15.0)
Immature Granulocytes: 0 %
Lymphocytes Relative: 13 %
Lymphs Abs: 1 10*3/uL (ref 0.7–4.0)
MCH: 30.7 pg (ref 26.0–34.0)
MCHC: 33.3 g/dL (ref 30.0–36.0)
MCV: 92 fL (ref 80.0–100.0)
Monocytes Absolute: 0.9 10*3/uL (ref 0.1–1.0)
Monocytes Relative: 11 %
Neutro Abs: 5.8 10*3/uL (ref 1.7–7.7)
Neutrophils Relative %: 73 %
Platelets: 228 10*3/uL (ref 150–400)
RBC: 4.73 MIL/uL (ref 3.87–5.11)
RDW: 13.2 % (ref 11.5–15.5)
WBC: 7.9 10*3/uL (ref 4.0–10.5)
nRBC: 0 % (ref 0.0–0.2)

## 2021-12-17 MED ORDER — HYDROCHLOROTHIAZIDE 25 MG PO TABS
25.0000 mg | ORAL_TABLET | Freq: Every day | ORAL | Status: DC
  Administered 2021-12-17: 25 mg via ORAL
  Filled 2021-12-17: qty 1

## 2021-12-17 MED ORDER — ONDANSETRON HCL 4 MG/2ML IJ SOLN
4.0000 mg | Freq: Once | INTRAMUSCULAR | Status: AC
  Administered 2021-12-17: 4 mg via INTRAVENOUS
  Filled 2021-12-17: qty 2

## 2021-12-17 MED ORDER — ENOXAPARIN SODIUM 40 MG/0.4ML IJ SOSY
40.0000 mg | PREFILLED_SYRINGE | INTRAMUSCULAR | Status: DC
  Filled 2021-12-17: qty 0.4

## 2021-12-17 MED ORDER — AMLODIPINE BESYLATE 10 MG PO TABS
10.0000 mg | ORAL_TABLET | Freq: Every day | ORAL | Status: DC
  Administered 2021-12-17 – 2021-12-18 (×2): 10 mg via ORAL
  Filled 2021-12-17 (×2): qty 1

## 2021-12-17 MED ORDER — LABETALOL HCL 5 MG/ML IV SOLN: 10.0000 mg | Freq: Once | INTRAVENOUS | Status: AC

## 2021-12-17 MED ORDER — ONDANSETRON HCL 4 MG PO TABS: 4.0000 mg | ORAL_TABLET | Freq: Four times a day (QID) | ORAL | Status: DC | PRN

## 2021-12-17 MED ORDER — ACETAMINOPHEN 650 MG RE SUPP: 650.0000 mg | Freq: Four times a day (QID) | RECTAL | Status: DC | PRN

## 2021-12-17 MED ORDER — ACETAMINOPHEN 325 MG PO TABS
650.0000 mg | ORAL_TABLET | Freq: Once | ORAL | Status: AC
  Administered 2021-12-17: 650 mg via ORAL
  Filled 2021-12-17: qty 2

## 2021-12-17 MED ORDER — SODIUM CHLORIDE 0.9% FLUSH: 3.0000 mL | INTRAVENOUS | Status: DC | PRN

## 2021-12-17 MED ORDER — ACETAMINOPHEN 325 MG PO TABS
650.0000 mg | ORAL_TABLET | Freq: Four times a day (QID) | ORAL | Status: DC | PRN
  Administered 2021-12-17 – 2021-12-18 (×2): 650 mg via ORAL
  Filled 2021-12-17 (×2): qty 2

## 2021-12-17 MED ORDER — LABETALOL HCL 5 MG/ML IV SOLN: 20.0000 mg | Freq: Once | INTRAVENOUS | Status: DC

## 2021-12-17 MED ORDER — LABETALOL HCL 5 MG/ML IV SOLN
INTRAVENOUS | Status: AC
  Administered 2021-12-17: 10 mg via INTRAVENOUS
  Filled 2021-12-17: qty 4

## 2021-12-17 MED ORDER — LABETALOL HCL 5 MG/ML IV SOLN: 10.0000 mg | Freq: Once | INTRAVENOUS | Status: DC

## 2021-12-17 MED ORDER — LISINOPRIL 10 MG PO TABS
10.0000 mg | ORAL_TABLET | Freq: Every day | ORAL | Status: DC
  Administered 2021-12-17 – 2021-12-18 (×2): 10 mg via ORAL
  Filled 2021-12-17 (×2): qty 1

## 2021-12-17 MED ORDER — SODIUM CHLORIDE 0.9% FLUSH
3.0000 mL | Freq: Two times a day (BID) | INTRAVENOUS | Status: DC
  Administered 2021-12-17 – 2021-12-18 (×2): 3 mL via INTRAVENOUS

## 2021-12-17 MED ORDER — ONDANSETRON HCL 4 MG/2ML IJ SOLN: 4.0000 mg | Freq: Four times a day (QID) | INTRAMUSCULAR | Status: DC | PRN

## 2021-12-17 MED ORDER — KETOROLAC TROMETHAMINE 15 MG/ML IJ SOLN
15.0000 mg | Freq: Once | INTRAMUSCULAR | Status: AC
  Administered 2021-12-17: 15 mg via INTRAVENOUS
  Filled 2021-12-17: qty 1

## 2021-12-17 MED ORDER — SODIUM CHLORIDE 0.9 % IV SOLN: 250.0000 mL | INTRAVENOUS | Status: DC | PRN

## 2021-12-17 MED ORDER — HYDRALAZINE HCL 25 MG PO TABS: 25.0000 mg | ORAL_TABLET | Freq: Four times a day (QID) | ORAL | Status: DC | PRN

## 2021-12-17 NOTE — Assessment & Plan Note (Signed)
Recent US without findings. She denies that this is bad lately.

## 2021-12-17 NOTE — Assessment & Plan Note (Signed)
Given 30 mg IM toradol during visit which did resolve headache >50% during visit. Suspect this is related to BP but temporal arteritis has not been ruled out so will check ESR today. It is unclear why her BP has been elevated severely lately however pain control of her headache caused BP to come down without intervention during visit from >200/100 initially to about 150/80s after pain controlled.

## 2021-12-17 NOTE — ED Notes (Signed)
Up to restroom, gait unsteady at times, required assistance and handrails to ambulate to restroom. She states she feels very weak.

## 2021-12-17 NOTE — Progress Notes (Signed)
Patient transferred from [facility] ED at 1700hrs. Oriented to room and plan of care for shift.  Dr. Sharl Ma notified of patient's arrival and BP 187/82.  He will come to see patient.

## 2021-12-17 NOTE — Telephone Encounter (Signed)
Pt is calling back to get the results from lab on 12/16/30. I advised the pt Dr. Okey Dupre result note states This test is normal ruling out any vasculitis causing the headaches and blood pressure issues. How are you doing today?  Pt states that she went to [facility] yesterday 12/16/21 about 5:00 with BP 210/100 with an extremely bad headache. Pt is currently still at [facility] getting ready to be transported to [facility] to be admitted. Pt states that her BP has been up and down since being at [facility].   Pt has a appt on 7/26 I advised her to keep it for HOS FU and if she see that she isn't going to be discharged by then to call back to reschedule.  FYI

## 2021-12-17 NOTE — Assessment & Plan Note (Signed)
She is having severe hypertension lately. Lisinopril increased to 10 mg daily about 2 days ago so will give this about 1 week. Previously end of June she was controlled on 5 mg lisinopril daily so perhaps her headaches and RUQ pain could be driving the high BP.

## 2021-12-17 NOTE — ED Notes (Signed)
Resting quietly with eyes closed, cont to await for room assignment and tx. Husband at bedside

## 2021-12-17 NOTE — ED Notes (Signed)
VS assessed post ambulation to restroom, client states she feels very weak and tired, gait unsteady upon return from [facility]

## 2021-12-17 NOTE — H&P (Signed)
TRH H&P    Patient Demographics:    Jasmine Esparza, is a 81 y.o. female  MRN: [id number]  DOB - 06/05/1940  Admit Date - 12/16/2021  Outpatient Primary MD for the patient is Myrlene Broker, MD  Patient coming from: [facility]  Chief complaint-headache, high blood pressure   HPI:    Jasmine Esparza  is a 81 y.o. female, with medical history of recent diagnosis of hypertension, headaches who came to the hospital with headache, lightheadedness and blurry vision.  Patient said that she was recently diagnosed with hypertension in June at that time she was started on lisinopril 5 mg daily, dose was recently escalated to 10 mg daily few days ago.  At home patient systolic blood pressure was in 200s.  Also had episode of palpitations, no chest pain or shortness of breath.  Did not pass out.  Denies nausea vomiting or diarrhea.  No abdominal pain. In the ED patient was found to be in hypertensive urgency with SBP in 200s Patient was given lisinopril 10 mg, also received HCTZ 25 mg  No previous history of CAD No previous history of stroke    Review of systems:    In addition to the HPI above,    All other systems reviewed and are negative.    Past History of the following :    Past Medical History:  Diagnosis Date   ALLERGIC RHINITIS    ECZEMA    GOITER, MULTINODULAR    s/p I-131 ablation 10/2009   Hypercalcemia    HYPERLIPIDEMIA    Hypertension    OSTEOPOROSIS       Past Surgical History:  Procedure Laterality Date   CATARACT EXTRACTION W/ INTRAOCULAR LENS IMPLANT Bilateral 2013      Social History:      Social History   Tobacco Use   Smoking status: Never    Passive exposure: Never   Smokeless tobacco: Never   Tobacco comments:    Married, lives with spouse. retired  Substance Use Topics   Alcohol use: No       Family History :     Family History  Problem Relation Age of  Onset   Stroke Mother    Arthritis Mother    Hypertension Mother    Prostate cancer Father    Arthritis Father    Hypertension Sister    Osteoporosis Sister    Hypertension Brother    Hyperlipidemia Brother       Home Medications:   Prior to Admission medications   Medication Sig Start Date End Date Taking? Authorizing Provider  acetaminophen (TYLENOL) 500 MG tablet Take 500 mg by mouth 2 (two) times daily.   Yes [provider]  denosumab (PROLIA) 60 MG/ML SOSY injection Inject 1 mL into the skin.   Yes [provider]  ketorolac (TORADOL) 10 MG tablet Take 10 mg by mouth every 6 (six) hours as needed.   Yes [provider]  lisinopril (ZESTRIL) 10 MG tablet Take 1 tablet (10 mg total) by mouth daily.  12/12/21  Yes Blane Ohara, MD  traMADol (ULTRAM) 50 MG tablet Take 1 tablet (50 mg total) by mouth every 8 (eight) hours as needed for up to 5 days. Patient not taking: Reported on 12/17/2021 12/15/21 12/20/21  Myrlene Broker, MD     Allergies:     Allergies  Allergen Reactions   Codeine     REACTION: nausea     Physical Exam:   Vitals  Blood pressure (!) 187/82, pulse 73, temperature 98.6 F (37 C), temperature source Oral, resp. rate 17, height 5\' 1"  (1.549 m), weight 53.8 kg, SpO2 97 %.  1.  General: Appears in no acute distress  2. Psychiatric: Alert, oriented x3, intact insight and judgment  3. Neurologic: Cranial nerves II through XII grossly intact, no focal deficit noted, motor strength 5/5 in all extremities  4. HEENMT:  Atraumatic normocephalic, extraocular muscles are intact  5. Respiratory : Clear to auscultation bilaterally  6. Cardiovascular : S1-S2, regular, no murmur auscultated  7. Gastrointestinal:  Abdomen is soft, nontender, no organomegaly  8. Skin:  No rashes noted  9.Musculoskeletal:  Edema in the lower extremities    Data Review:    CBC Recent Labs  Lab 12/12/21 1513 12/16/21 2048  12/17/21 1642  WBC 5.7 6.1 7.9  HGB 14.4 13.8 14.5  HCT 43.5 41.4 43.5  PLT 236 220 228  MCV 93.3 91.6 92.0  MCH 30.9 30.5 30.7  MCHC 33.1 33.3 33.3  RDW 13.2 13.1 13.2  LYMPHSABS  --  0.9 1.0  MONOABS  --  0.7 0.9  EOSABS  --  0.1 0.2  BASOSABS  --  0.0 0.0   ------------------------------------------------------------------------------------------------------------------  Results for orders placed or performed during the hospital encounter of 12/16/21 (from the past 48 hour(s))  Urinalysis, Routine w reflex microscopic Urine, Clean Catch     Status: Abnormal   Collection Time: 12/16/21  6:03 PM  Result Value Ref Range   Color, Urine COLORLESS (A) YELLOW   APPearance CLEAR CLEAR   Specific Gravity, Urine <1.005 (L) 1.005 - 1.030   pH 7.5 5.0 - 8.0   Glucose, UA NEGATIVE NEGATIVE mg/dL   Hgb urine dipstick NEGATIVE NEGATIVE   Bilirubin Urine NEGATIVE NEGATIVE   Ketones, ur NEGATIVE NEGATIVE mg/dL   Protein, ur NEGATIVE NEGATIVE mg/dL   Nitrite NEGATIVE NEGATIVE   Leukocytes,Ua TRACE (A) NEGATIVE   WBC, UA 0-5 0 - 5 WBC/hpf   Squamous Epithelial / LPF 0-5 0 - 5   Mucus PRESENT     Comment: Performed at 12/18/21, [address], [place], [place]  Comprehensive metabolic panel     Status: Abnormal   Collection Time: 12/16/21  7:52 PM  Result Value Ref Range   Sodium 141 135 - 145 mmol/L   Potassium 4.3 3.5 - 5.1 mmol/L   Chloride 102 98 - 111 mmol/L   CO2 22 22 - 32 mmol/L   Glucose, Bld 103 (H) 70 - 99 mg/dL    Comment: Glucose reference range applies only to samples taken after fasting for at least 8 hours.   BUN 15 8 - 23 mg/dL   Creatinine, Ser 12/18/21 0.44 - 1.00 mg/dL   Calcium 9.7 8.9 - 6.38 mg/dL   Total Protein 6.9 6.5 - 8.1 g/dL   Albumin 4.6 3.5 - 5.0 g/dL   AST 30 15 - 41 U/L   ALT 14 0 - 44 U/L   Alkaline Phosphatase 49 38 - 126 U/L   Total  Bilirubin 0.5 0.3 - 1.2 mg/dL   GFR, Estimated >16>60 >10>60 mL/min    Comment:  (NOTE) Calculated using the CKD-EPI Creatinine Equation (2021)    Anion gap 17 (H) 5 - 15    Comment: Performed at [facility], [address], [place], [place] [address]  CBC with Differential/Platelet     Status: None   Collection Time: 12/16/21  8:48 PM  Result Value Ref Range   WBC 6.1 4.0 - 10.5 K/uL   RBC 4.52 3.87 - 5.11 MIL/uL   Hemoglobin 13.8 12.0 - 15.0 g/dL   HCT 40.941.4 81.136.0 - 91.446.0 %   MCV 91.6 80.0 - 100.0 fL   MCH 30.5 26.0 - 34.0 pg   MCHC 33.3 30.0 - 36.0 g/dL   RDW 78.213.1 95.611.5 - 21.315.5 %   Platelets 220 150 - 400 K/uL   nRBC 0.0 0.0 - 0.2 %   Neutrophils Relative % 72 %   Neutro Abs 4.4 1.7 - 7.7 K/uL   Lymphocytes Relative 14 %   Lymphs Abs 0.9 0.7 - 4.0 K/uL   Monocytes Relative 11 %   Monocytes Absolute 0.7 0.1 - 1.0 K/uL   Eosinophils Relative 2 %   Eosinophils Absolute 0.1 0.0 - 0.5 K/uL   Basophils Relative 1 %   Basophils Absolute 0.0 0.0 - 0.1 K/uL   Immature Granulocytes 0 %   Abs Immature Granulocytes 0.02 0.00 - 0.07 K/uL    Comment: Performed at [facility], [address], [place], [place] [address]  CBC with Differential     Status: None   Collection Time: 12/17/21  4:42 PM  Result Value Ref Range   WBC 7.9 4.0 - 10.5 K/uL   RBC 4.73 3.87 - 5.11 MIL/uL   Hemoglobin 14.5 12.0 - 15.0 g/dL   HCT 84.643.5 96.236.0 - 95.246.0 %   MCV 92.0 80.0 - 100.0 fL   MCH 30.7 26.0 - 34.0 pg   MCHC 33.3 30.0 - 36.0 g/dL   RDW 84.113.2 32.411.5 - 40.115.5 %   Platelets 228 150 - 400 K/uL   nRBC 0.0 0.0 - 0.2 %   Neutrophils Relative % 73 %   Neutro Abs 5.8 1.7 - 7.7 K/uL   Lymphocytes Relative 13 %   Lymphs Abs 1.0 0.7 - 4.0 K/uL   Monocytes Relative 11 %   Monocytes Absolute 0.9 0.1 - 1.0 K/uL   Eosinophils Relative 2 %   Eosinophils Absolute 0.2 0.0 - 0.5 K/uL   Basophils Relative 1 %   Basophils Absolute 0.0 0.0 - 0.1 K/uL   Immature Granulocytes 0 %   Abs Immature Granulocytes 0.02 0.00 - 0.07 K/uL    Comment: Performed at [facility], [address] [place], [place] [address]    Chemistries  Recent Labs  Lab 12/12/21 1513 12/16/21 1952  NA 143 141  K 3.2* 4.3  CL 104 102  CO2 31 22  GLUCOSE 95 103*  BUN 20 15  CREATININE 0.82 0.88  CALCIUM 9.6 9.7  AST 19 30  ALT 11 14  ALKPHOS 41 49  BILITOT 0.4 0.5   ------------------------------------------------------------------------------------------------------------------  ------------------------------------------------------------------------------------------------------------------ GFR: Estimated Creatinine Clearance: 39.1 mL/min (by C-G formula based on SCr of 0.88 mg/dL). Liver Function Tests: Recent Labs  Lab 12/12/21 1513 12/16/21 1952  AST 19 30  ALT 11 14  ALKPHOS 41 49  BILITOT 0.4 0.5  PROT 6.9 6.9  ALBUMIN 4.5 4.6   Recent Labs  Lab 12/12/21 1513  LIPASE 43   No results for input(s): "AMMONIA" in the last 168 hours. Coagulation Profile: No results for input(s): "INR", "PROTIME" in the last 168 hours. Cardiac Enzymes: No results for input(s): "CKTOTAL", "CKMB", "CKMBINDEX", "TROPONINI" in the last 168 hours. BNP (last 3 results) No results for input(s): "PROBNP" in the last 8760 hours. HbA1C: No results for input(s): "HGBA1C" in the last 72 hours. CBG: Recent Labs  Lab 12/12/21 1522  GLUCAP 97   Lipid Profile: No results for input(s): "CHOL", "HDL", "LDLCALC", "TRIG", "CHOLHDL", "LDLDIRECT" in the last 72 hours. Thyroid Function Tests: No results for input(s): "TSH", "T4TOTAL", "FREET4", "T3FREE", "THYROIDAB" in the last 72 hours. Anemia Panel: No results for input(s): "VITAMINB12", "FOLATE", "FERRITIN", "TIBC", "IRON", "RETICCTPCT" in the last 72 hours.  --------------------------------------------------------------------------------------------------------------- Urine analysis:    Component Value Date/Time   COLORURINE COLORLESS (A) 12/16/2021 1803   APPEARANCEUR CLEAR 12/16/2021 1803   LABSPEC <1.005  (L) 12/16/2021 1803   PHURINE 7.5 12/16/2021 1803   GLUCOSEU NEGATIVE 12/16/2021 1803   GLUCOSEU NEGATIVE 05/20/2014 1140   HGBUR NEGATIVE 12/16/2021 1803   BILIRUBINUR NEGATIVE 12/16/2021 1803   BILIRUBINUR negative 11/29/2021 1045   KETONESUR NEGATIVE 12/16/2021 1803   PROTEINUR NEGATIVE 12/16/2021 1803   UROBILINOGEN 0.2 11/29/2021 1045   UROBILINOGEN 0.2 05/20/2014 1140   NITRITE NEGATIVE 12/16/2021 1803   LEUKOCYTESUR TRACE (A) 12/16/2021 1803      Imaging Results:    US THYROID  Result Date: 12/17/2021 CLINICAL DATA:  Goiter. History of multinodular thyroid gland. Patient previously underwent biopsy of right inferior and left inferior thyroid nodules in April of 2011. EXAM: THYROID ULTRASOUND TECHNIQUE: Ultrasound examination of the thyroid gland and adjacent soft tissues was performed. COMPARISON:  Prior thyroid ultrasound 03/15/2013 FINDINGS: Parenchymal Echotexture: Moderately heterogenous Isthmus: 0.2 cm Right lobe: 3.8 x 2.1 x 1.2 cm Left lobe: 4.3 x 1.9 x 1.1 cm _________________________________________________________ Estimated total number of nodules >/= 1 cm: 3 Number of spongiform nodules >/=  2 cm not described below (TR1): 0 Number of mixed cystic and solid nodules >/= 1.5 cm not described below (TR2): 0 _________________________________________________________ Nodule # 2: Greater than 5 year stability of isoechoic solid nodule in the right inferior gland at 1.0 x 1.1 x 1.0 cm. Nodule # 3: Confirmed greater than 5 year stability of 1.2 cm isoechoic solid nodule in the right inferior gland. Nodule # 5: Previously biopsied nodule in the left inferior gland measures 2.2 x 1.9 by 1.6 cm, insignificantly changed compared to 2.4 x 1.7 x 1.6 cm in 2014. IMPRESSION: Confirmed greater than 5 year stability of bilateral thyroid nodules including nodules previously biopsied in the right inferior and left inferior gland. Findings are consistent with benignity. No new nodules or suspicious  features. The above is in keeping with the ACR TI-RADS recommendations - J Am Coll Radiol 2017;14:587-595. Electronically Signed   By: Malachy Moan M.D.   On: 12/17/2021 07:00   CT Head Wo Contrast  Result Date: 12/16/2021 CLINICAL DATA:  Headache EXAM: CT HEAD WITHOUT CONTRAST TECHNIQUE: Contiguous axial images were obtained from the base of the skull through the vertex without intravenous contrast. RADIATION DOSE REDUCTION: This exam was performed according to the departmental dose-optimization program which includes automated exposure control, adjustment of the mA and/or kV according to patient size and/or use of iterative reconstruction technique. COMPARISON:  MRI 11/21/2021 FINDINGS: Brain: No acute territorial infarction, hemorrhage or intracranial mass. Mild chronic small vessel ischemic changes of the white matter. Ventricles are nonenlarged Vascular: No hyperdense  vessel or unexpected calcification. Skull: Normal. Negative for fracture or focal lesion. Sinuses/Orbits: No acute finding. Other: None IMPRESSION: 1. No CT evidence for acute intracranial abnormality. 2. Mild chronic small vessel ischemic changes of the white matter Electronically Signed   By: Jasmine Pang M.D.   On: 12/16/2021 20:38    My personal review of EKG: Rhythm NSR, no ST changes noted   Assessment & Plan:    Principal Problem:   Hypertensive urgency   Hypertensive urgency-blood pressure has improved after patient received HCTZ 25 mg and lisinopril 10 mg.  I will discontinue HCTZ and continue with lisinopril 10 mg daily.  I will add amlodipine 10 mg daily.  We will also start hydralazine 25 mg p.o. every 6 hours as needed for BP greater than 160/100.  Hypertension-new diagnosis, patient blood pressure was normal before June of this year.  Patient presented with hypokalemia and hypertension, will obtain aldosterone renin activity with ratio in a.m.  Also check echocardiogram.  Will check cortisol level in a.m.  CTA  abdomen/pelvis was done which showed normal renal arteries.  Adrenal glands did not show any adenoma.  TSH was normal.  Thyroid ultrasound showed benign nodules.  Patient tells me that she has started taking over-the-counter supplements including powdered vitamins which she ordered from [employer].  I looked at the ingredients of the powdered vitamins, which includes multiple herbs along with vitamins.  I have recommended patient to stop taking vitamins and herbs, which can interact with other medications and raise patient's blood pressure.  Headache-patient had CT head and also had a CTA neck which did not show arterial dissection.  We will give 1 dose of Toradol 15 mg IV x1.  Continue Tylenol as needed for headache.  Likely due to hypertensive urgency.  Thyroid nodules-TSH is 2.459.  Thyroid ultrasound obtained shows benign nodules.  Hypokalemia-potassium is replete.   DVT Prophylaxis-   Lovenox   AM Labs Ordered, also please review Full Orders  Family Communication: Admission, patients condition and plan of care including tests being ordered have been discussed with the patient, her husband and patient's sister at bedside who indicate understanding and agree with the plan and Code Status.  Code Status: Full code  Admission status: Observation  Time spent in minutes :60 min   Oria Klimas S Raysha Tilmon M.D

## 2021-12-17 NOTE — Assessment & Plan Note (Signed)
Ordered US thyroid which is non-emergent. She has had prior imaging and no lab abnormality. This is incidental finding on recent imaging from ER.

## 2021-12-18 ENCOUNTER — Observation Stay (HOSPITAL_BASED_OUTPATIENT_CLINIC_OR_DEPARTMENT_OTHER): Payer: Medicare HMO

## 2021-12-18 DIAGNOSIS — I421 Obstructive hypertrophic cardiomyopathy: Secondary | ICD-10-CM | POA: Diagnosis not present

## 2021-12-18 DIAGNOSIS — R030 Elevated blood-pressure reading, without diagnosis of hypertension: Secondary | ICD-10-CM | POA: Diagnosis not present

## 2021-12-18 DIAGNOSIS — R519 Headache, unspecified: Secondary | ICD-10-CM | POA: Diagnosis not present

## 2021-12-18 DIAGNOSIS — I160 Hypertensive urgency: Secondary | ICD-10-CM | POA: Diagnosis not present

## 2021-12-18 LAB — CBC
HCT: 40.4 % (ref 36.0–46.0)
Hemoglobin: 13.6 g/dL (ref 12.0–15.0)
MCH: 30.6 pg (ref 26.0–34.0)
MCHC: 33.7 g/dL (ref 30.0–36.0)
MCV: 91 fL (ref 80.0–100.0)
Platelets: 220 10*3/uL (ref 150–400)
RBC: 4.44 MIL/uL (ref 3.87–5.11)
RDW: 13.2 % (ref 11.5–15.5)
WBC: 7.6 10*3/uL (ref 4.0–10.5)
nRBC: 0 % (ref 0.0–0.2)

## 2021-12-18 LAB — COMPREHENSIVE METABOLIC PANEL
ALT: 15 U/L (ref 0–44)
AST: 18 U/L (ref 15–41)
Albumin: 3.7 g/dL (ref 3.5–5.0)
Alkaline Phosphatase: 46 U/L (ref 38–126)
Anion gap: 10 (ref 5–15)
BUN: 13 mg/dL (ref 8–23)
CO2: 27 mmol/L (ref 22–32)
Calcium: 8.9 mg/dL (ref 8.9–10.3)
Chloride: 101 mmol/L (ref 98–111)
Creatinine, Ser: 0.93 mg/dL (ref 0.44–1.00)
GFR, Estimated: >60 mL/min (ref >60)
Glucose, Bld: 103 mg/dL — ABNORMAL HIGH (ref 70–99)
Potassium: 3 mmol/L — ABNORMAL LOW (ref 3.5–5.1)
Sodium: 138 mmol/L (ref 135–145)
Total Bilirubin: 0.8 mg/dL (ref 0.3–1.2)
Total Protein: 6.1 g/dL — ABNORMAL LOW (ref 6.5–8.1)

## 2021-12-18 LAB — POTASSIUM: Potassium: 4.1 mmol/L (ref 3.5–5.1)

## 2021-12-18 LAB — ECHOCARDIOGRAM COMPLETE
Area-P 1/2: 3.06 cm2
Height: 61 in
S' Lateral: 2.4 cm
Weight: 1896 oz

## 2021-12-18 LAB — CORTISOL: Cortisol, Plasma: 3 ug/dL

## 2021-12-18 LAB — MAGNESIUM: Magnesium: 2.1 mg/dL (ref 1.7–2.4)

## 2021-12-18 MED ORDER — POTASSIUM CHLORIDE 10 MEQ/100ML IV SOLN
10.0000 meq | Freq: Once | INTRAVENOUS | Status: AC
  Administered 2021-12-18: 10 meq via INTRAVENOUS
  Filled 2021-12-18: qty 100

## 2021-12-18 MED ORDER — AMLODIPINE BESYLATE 10 MG PO TABS: 10.0000 mg | ORAL_TABLET | Freq: Every day | ORAL | 3 refills | Status: DC

## 2021-12-18 MED ORDER — LISINOPRIL 10 MG PO TABS: 10.0000 mg | ORAL_TABLET | Freq: Every day | ORAL | 2 refills | Status: DC

## 2021-12-18 MED ORDER — POTASSIUM CHLORIDE CRYS ER 20 MEQ PO TBCR
40.0000 meq | EXTENDED_RELEASE_TABLET | ORAL | Status: AC
  Administered 2021-12-18 (×2): 40 meq via ORAL
  Filled 2021-12-18 (×2): qty 2

## 2021-12-18 NOTE — Discharge Summary (Signed)
Physician Discharge Summary   Patient: Jasmine Esparza MRN: [id number] DOB: Dec 01, 1940  Admit date:     12/16/2021  Discharge date: 12/18/21  Discharge Physician: Oswald Hillock   PCP: Hoyt Koch, MD   Recommendations at discharge:   Follow-up aldosterone/renin activity with ratio level as outpatient Follow-up PCP in 1 week  Discharge Diagnoses: Principal Problem:   Hypertensive urgency  Resolved Problems:   * No resolved hospital problems. *  Hospital Course: 80 y.o. female, with medical history of recent diagnosis of hypertension, headaches who came to the hospital with headache, lightheadedness and blurry vision.  Patient said that she was recently diagnosed with hypertension in June at that time she was started on lisinopril 5 mg daily, dose was recently escalated to 10 mg daily few days ago.  At home patient systolic blood pressure was in 200s.  Also had episode of palpitations, no chest pain or shortness of breath.  Did not pass out.  Denies nausea vomiting or diarrhea.  No abdominal pain. In the ED patient was found to be in hypertensive urgency with SBP in 200s Patient was given lisinopril 10 mg, also received HCTZ 25 mg  Assessment and Plan:  Hypertensive urgency Resolved, patient received HCTZ 25 mg and lisinopril 10 mg in the ED -HCTZ was discontinued due to hypokalemia and muscle cramps -Patient started on amlodipine 10 mg daily along with lisinopril 10 mg daily -Blood pressure has significantly improved Echocardiogram showed grade 1 diastolic dysfunction with EF of 60 to 65%  Hypokalemia -Potassium was 3.0 this morning, secondary to HCTZ -Potassium replaced -Repeat potassium 4.1  Hypertension -Patient's blood pressure was normal before June of this year -Work-up started for secondary causes -Cortisol was low at 3.0, likely stress-induced; patient is not hypotensive -TSH was normal -CT abdomen pelvis showed normal renal arteries -CT imaging showed  adrenal glands with no adenoma -Aldosterone renin activity with ratio obtained, result is currently pending -Also showed thyroid nodules; thyroid ultrasound obtained which showed benign nodules -We will need to follow-up aldosterone and renin levels as outpatient  Headache -Resolved -Likely due to hypertensive urgency -CTA neck showed no arterial dissection  Thyroid nodules -TSH 2.459 -Thyroid ultrasound showed benign nodules      Consultants:  Procedures performed:  Disposition: Home Diet recommendation:  Discharge Diet Orders (From admission, onward)     Start     Ordered   12/18/21 0000  Diet - low sodium heart healthy        12/18/21 1600           Cardiac diet DISCHARGE MEDICATION: Allergies as of 12/18/2021       Reactions   Codeine    REACTION: nausea        Medication List     STOP taking these medications    ketorolac 10 MG tablet Commonly known as: TORADOL       TAKE these medications    acetaminophen 500 MG tablet Commonly known as: TYLENOL Take 500 mg by mouth 2 (two) times daily.   amLODipine 10 MG tablet Commonly known as: NORVASC Take 1 tablet (10 mg total) by mouth daily. Start taking on: December 19, 2021   lisinopril 10 MG tablet Commonly known as: ZESTRIL Take 1 tablet (10 mg total) by mouth daily.   Prolia 60 MG/ML Sosy injection Generic drug: denosumab Inject 1 mL into the skin.   traMADol 50 MG tablet Commonly known as: ULTRAM Take 1 tablet (50 mg total) by mouth every 8 (  eight) hours as needed for up to 5 days.        Discharge Exam: Filed Weights   12/17/21 1700  Weight: 53.8 kg   General-appears in no acute distress Heart-S1-S2, regular, no murmur auscultated Lungs-clear to auscultation bilaterally, no wheezing or crackles auscultated Abdomen-soft, nontender, no organomegaly Extremities-no edema in the lower extremities Neuro-alert, oriented x3, no focal deficit noted  Condition at discharge: good  The  results of significant diagnostics from this hospitalization (including imaging, microbiology, ancillary and laboratory) are listed below for reference.   Imaging Studies: ECHOCARDIOGRAM COMPLETE  Result Date: 12/18/2021    ECHOCARDIOGRAM REPORT   Patient Name:   Jasmine Esparza Date of Exam: 12/18/2021 Medical Rec #:  [id number]     Height:       61.0 in Accession #:    [id number]    Weight:       118.5 lb Date of Birth:  [birth year]     BSA:          1.512 m Patient Age:    33 years      BP:           137/72 mmHg Patient Gender: F             HR:           77 bpm. Exam Location:  Inpatient Procedure: 2D Echo, Cardiac Doppler and Color Doppler Indications:    Cardiomyopathy-Hypertrophic I42.1  History:        Patient has no prior history of Echocardiogram examinations.                 Risk Factors:Hypertension and Dyslipidemia.  Sonographer:    Darlina Sicilian RDCS Referring Phys: [place]  1. Left ventricular ejection fraction, by estimation, is 60 to 65%. The left ventricle has normal function. The left ventricle has no regional wall motion abnormalities. Left ventricular diastolic parameters are consistent with Grade I diastolic dysfunction (impaired relaxation).  2. Diagnostic criteria for hypertrophic cardiomyopathy not met.  3. Right ventricular systolic function is normal. The right ventricular size is normal. Tricuspid regurgitation signal is inadequate for assessing PA pressure.  4. The mitral valve is normal in structure. No evidence of mitral valve regurgitation. No evidence of mitral stenosis.  5. The aortic valve was not well visualized. Aortic valve regurgitation is not visualized. No aortic stenosis is present.  6. The inferior vena cava is normal in size with greater than 50% respiratory variability, suggesting right atrial pressure of 3 mmHg. Comparison(s): No prior Echocardiogram. FINDINGS  Left Ventricle: Left ventricular ejection fraction, by estimation, is 60 to 65%. The  left ventricle has normal function. The left ventricle has no regional wall motion abnormalities. The left ventricular internal cavity size was small. There is no left ventricular hypertrophy. Left ventricular diastolic parameters are consistent with Grade I diastolic dysfunction (impaired relaxation). Right Ventricle: The right ventricular size is normal. No increase in right ventricular wall thickness. Right ventricular systolic function is normal. Tricuspid regurgitation signal is inadequate for assessing PA pressure. Left Atrium: Left atrial size was normal in size. Right Atrium: Right atrial size was normal in size. Pericardium: There is no evidence of pericardial effusion. Presence of epicardial fat layer. Mitral Valve: The mitral valve is normal in structure. Mild mitral annular calcification. No evidence of mitral valve regurgitation. No evidence of mitral valve stenosis. Tricuspid Valve: The tricuspid valve is normal in structure. Tricuspid valve regurgitation is not demonstrated. No evidence of  tricuspid stenosis. Aortic Valve: The aortic valve was not well visualized. Aortic valve regurgitation is not visualized. No aortic stenosis is present. Pulmonic Valve: The pulmonic valve was not well visualized. Pulmonic valve regurgitation is not visualized. No evidence of pulmonic stenosis. Aorta: The aortic root is normal in size and structure. Venous: The inferior vena cava is normal in size with greater than 50% respiratory variability, suggesting right atrial pressure of 3 mmHg. IAS/Shunts: No atrial level shunt detected by color flow Doppler.  LEFT VENTRICLE PLAX 2D LVIDd:         3.60 cm   Diastology LVIDs:         2.40 cm   LV e' medial:    3.75 cm/s LV PW:         0.80 cm   LV E/e' medial:  14.9 LV IVS:        0.90 cm   LV e' lateral:   6.90 cm/s LVOT diam:     1.70 cm   LV E/e' lateral: 8.1 LV SV:         50 LV SV Index:   33 LVOT Area:     2.27 cm  RIGHT VENTRICLE RV S prime:     18.10 cm/s TAPSE  (M-mode): 1.8 cm LEFT ATRIUM             Index LA diam:        3.20 cm 2.12 cm/m LA Vol (A2C):   23.3 ml 15.41 ml/m LA Vol (A4C):   35.2 ml 23.25 ml/m LA Biplane Vol: 30.0 ml 19.84 ml/m  AORTIC VALVE LVOT Vmax:   98.20 cm/s LVOT Vmean:  74.600 cm/s LVOT VTI:    0.219 m  AORTA Ao Root diam: 2.60 cm Ao Asc diam:  2.50 cm MITRAL VALVE MV Area (PHT): 3.06 cm    SHUNTS MV Decel Time: 248 msec    Systemic VTI:  0.22 m MV E velocity: 55.90 cm/s  Systemic Diam: 1.70 cm MV A velocity: 71.70 cm/s MV E/A ratio:  0.78 Rudean Haskell MD Electronically signed by Rudean Haskell MD Signature Date/Time: 12/18/2021/1:21:21 PM    Final    US THYROID  Result Date: 12/17/2021 CLINICAL DATA:  Goiter. History of multinodular thyroid gland. Patient previously underwent biopsy of right inferior and left inferior thyroid nodules in April of 2011. EXAM: THYROID ULTRASOUND TECHNIQUE: Ultrasound examination of the thyroid gland and adjacent soft tissues was performed. COMPARISON:  Prior thyroid ultrasound 03/15/2013 FINDINGS: Parenchymal Echotexture: Moderately heterogenous Isthmus: 0.2 cm Right lobe: 3.8 x 2.1 x 1.2 cm Left lobe: 4.3 x 1.9 x 1.1 cm _________________________________________________________ Estimated total number of nodules >/= 1 cm: 3 Number of spongiform nodules >/=  2 cm not described below (TR1): 0 Number of mixed cystic and solid nodules >/= 1.5 cm not described below (TR2): 0 _________________________________________________________ Nodule # 2: Greater than 5 year stability of isoechoic solid nodule in the right inferior gland at 1.0 x 1.1 x 1.0 cm. Nodule # 3: Confirmed greater than 5 year stability of 1.2 cm isoechoic solid nodule in the right inferior gland. Nodule # 5: Previously biopsied nodule in the left inferior gland measures 2.2 x 1.9 by 1.6 cm, insignificantly changed compared to 2.4 x 1.7 x 1.6 cm in 2014. IMPRESSION: Confirmed greater than 5 year stability of bilateral thyroid nodules  including nodules previously biopsied in the right inferior and left inferior gland. Findings are consistent with benignity. No new nodules or suspicious features. The above is in keeping  with the ACR TI-RADS recommendations - J Am Coll Radiol 2017;14:587-595. Electronically Signed   By: Jacqulynn Cadet M.D.   On: 12/17/2021 07:00   CT Head Wo Contrast  Result Date: 12/16/2021 CLINICAL DATA:  Headache EXAM: CT HEAD WITHOUT CONTRAST TECHNIQUE: Contiguous axial images were obtained from the base of the skull through the vertex without intravenous contrast. RADIATION DOSE REDUCTION: This exam was performed according to the departmental dose-optimization program which includes automated exposure control, adjustment of the mA and/or kV according to patient size and/or use of iterative reconstruction technique. COMPARISON:  MRI 11/21/2021 FINDINGS: Brain: No acute territorial infarction, hemorrhage or intracranial mass. Mild chronic small vessel ischemic changes of the white matter. Ventricles are nonenlarged Vascular: No hyperdense vessel or unexpected calcification. Skull: Normal. Negative for fracture or focal lesion. Sinuses/Orbits: No acute finding. Other: None IMPRESSION: 1. No CT evidence for acute intracranial abnormality. 2. Mild chronic small vessel ischemic changes of the white matter Electronically Signed   By: Donavan Foil M.D.   On: 12/16/2021 20:38   CT Angio Chest/Abd/Pel for Dissection W and/or Wo Contrast  Result Date: 12/12/2021 CLINICAL DATA:  Chest pain, back pain, lower abdominal pain EXAM: CT ANGIOGRAPHY CHEST, ABDOMEN AND PELVIS TECHNIQUE: Non-contrast CT of the chest was initially obtained. Multidetector CT imaging through the chest, abdomen and pelvis was performed using the standard protocol during bolus administration of intravenous contrast. Multiplanar reconstructed images and MIPs were obtained and reviewed to evaluate the vascular anatomy. RADIATION DOSE REDUCTION: This exam was  performed according to the departmental dose-optimization program which includes automated exposure control, adjustment of the mA and/or kV according to patient size and/or use of iterative reconstruction technique. CONTRAST:  23mL OMNIPAQUE IOHEXOL 350 MG/ML SOLN COMPARISON:  CT abdomen and pelvis done on 11/02/2021 FINDINGS: CTA CHEST FINDINGS Cardiovascular: There is no demonstrable intramural hematoma thoracic aorta in noncontrast images. There is homogeneous enhancement in thoracic aorta. There is no demonstrable intimal flap. Scattered coronary artery calcifications are seen. There are no intraluminal filling defects in pulmonary artery branches. Mediastinum/Nodes: No significant lymphadenopathy seen. There are nodules in the thyroid largest measuring 2 cm in diameter in the posterior left lobe Lungs/Pleura: There are linear densities in both apices suggesting scarring. There is no focal pulmonary consolidation. There are scattered small foci of pleural thickening. There is no pleural effusion or pneumothorax. Musculoskeletal: No acute findings are seen. Review of the MIP images confirms the above findings. CTA ABDOMEN AND PELVIS FINDINGS VASCULAR Aorta: Scattered calcifications are seen. There is no dissection or focal aneurysmal dilation. Celiac: No significant stenosis. SMA: No significant stenosis. Renals: Unremarkable. IMA: Patent. Inflow: Unremarkable. Veins: Unremarkable. Review of the MIP images confirms the above findings. NON-VASCULAR Hepatobiliary: No focal abnormalities are seen in the liver. There is no dilation of bile ducts. Gallbladder is not distended. Pancreas: No focal abnormalities are seen. Spleen: Unremarkable. Adrenals/Urinary Tract: Adrenals are not enlarged. There is no hydronephrosis. There is prominence of renal pelvis in both kidneys with no significant dilation of minor calices. Ureters are not dilated. There are no renal or ureteral stones. Urinary bladder is unremarkable.  Stomach/Bowel: Small hiatal hernia is seen. Small bowel loops are not dilated. Appendix is not dilated. Scattered diverticula are seen in colon. There is no significant wall thickening and colon. There is no pericolic stranding. Lymphatic: Unremarkable. Reproductive: Uterus is to the right of midline. There are no dominant adnexal masses. Other: There is no ascites or pneumoperitoneum. Small umbilical hernia containing fat is seen. Musculoskeletal:  No acute findings are seen. Review of the MIP images confirms the above findings. IMPRESSION: There is no evidence of dissection in the thoracic and abdominal aorta. Major branches of thoracic and abdominal aorta are patent. There is no evidence of mediastinal or retroperitoneal hematoma. No focal pulmonary infiltrates are seen. There is no evidence of intestinal obstruction or pneumoperitoneum. Appendix is not dilated. There is no significant hydronephrosis. Scattered coronary artery calcifications are seen. Fatty liver. Small hiatal hernia. Diverticulosis of colon. There are a few low-density nodules in the thyroid largest measuring 2 cm in the posterior left lobe. Follow-up thyroid sonogram in outpatient setting may be considered. Other findings as described in the body of the report. Electronically Signed   By: Elmer Picker M.D.   On: 12/12/2021 17:09   US Abdomen Limited RUQ (LIVER/GB)  Result Date: 12/12/2021 CLINICAL DATA:  Right upper quadrant pain for 1 month EXAM: ULTRASOUND ABDOMEN LIMITED RIGHT UPPER QUADRANT COMPARISON:  CT 11/02/2021 FINDINGS: Gallbladder: No gallstones or wall thickening visualized. No sonographic Murphy sign noted by sonographer. Common bile duct: Diameter: 4 mm. Liver: No focal lesion identified. Within normal limits in parenchymal echogenicity. Portal vein is patent on color Doppler imaging with normal direction of blood flow towards the liver. Other: None. IMPRESSION: Unremarkable right upper quadrant ultrasound.  Electronically Signed   By: Davina Poke D.O.   On: 12/12/2021 15:50   MR BRAIN WO CONTRAST  Result Date: 11/21/2021 CLINICAL DATA:  Dizziness EXAM: MRI HEAD WITHOUT CONTRAST TECHNIQUE: Multiplanar, multiecho pulse sequences of the brain and surrounding structures were obtained without intravenous contrast. COMPARISON:  None Available. FINDINGS: Brain: No acute infarct, mass effect or extra-axial collection. No acute or chronic hemorrhage. There is multifocal hyperintense T2-weighted signal within the white matter. Parenchymal volume and CSF spaces are normal. The midline structures are normal. Vascular: Major flow voids are preserved. Skull and upper cervical spine: Normal calvarium and skull base. Visualized upper cervical spine and soft tissues are normal. Sinuses/Orbits:No paranasal sinus fluid levels or advanced mucosal thickening. No mastoid or middle ear effusion. Normal orbits. IMPRESSION: 1. No acute intracranial abnormality. 2. Findings of chronic small vessel ischemia. Electronically Signed   By: Ulyses Jarred M.D.   On: 11/21/2021 21:28    Microbiology: Results for orders placed or performed in visit on 11/03/21  Urine Culture     Status: None   Collection Time: 11/03/21 11:41 AM   Specimen: Urine  Result Value Ref Range Status   Source: URINE  Final   Status: FINAL  Final   Isolate 1:   Final    Less than 10,000 CFU/mL of single Gram negative organism isolated. No further testing will be performed. If clinically indicated, recollection using a method to minimize contamination, with prompt transfer to Urine Culture Transport Tube, is recommended.   Comment:   Final    Additional non-predominating organism(s) isolated. These organisms, commonly found on external and internal genitalia, are considered colonizers. No further testing performed. Due to supply chain limits, the laboratory is temporarily performing urine  cultures from FDA approved preservative tubes that have not been  validated by [facility], as well as from refrigerated sterile urine collection cups that do not contain a preservative. Culture of unpreserved urine may produce falsely elevated  bacterial counts.     Labs: CBC: Recent Labs  Lab 12/12/21 1513 12/16/21 2048 12/17/21 1642 12/18/21 0213  WBC 5.7 6.1 7.9 7.6  NEUTROABS  --  4.4 5.8  --   HGB 14.4  13.8 14.5 13.6  HCT 43.5 41.4 43.5 40.4  MCV 93.3 91.6 92.0 91.0  PLT 236 220 228 589   Basic Metabolic Panel: Recent Labs  Lab 12/12/21 1513 12/16/21 1952 12/18/21 0213 12/18/21 1411  NA 143 141 138  --   K 3.2* 4.3 3.0* 4.1  CL 104 102 101  --   CO2 $Re'31 22 27  'lZH$ --   GLUCOSE 95 103* 103*  --   BUN $Re'20 15 13  'bzq$ --   CREATININE 0.82 0.88 0.93  --   CALCIUM 9.6 9.7 8.9  --   MG  --   --  2.1  --    Liver Function Tests: Recent Labs  Lab 12/12/21 1513 12/16/21 1952 12/18/21 0213  AST $Re'19 30 18  'UZB$ ALT $R'11 14 15  'Bf$ ALKPHOS 41 49 46  BILITOT 0.4 0.5 0.8  PROT 6.9 6.9 6.1*  ALBUMIN 4.5 4.6 3.7   CBG: Recent Labs  Lab 12/12/21 1522  GLUCAP 97    Discharge time spent: greater than 30 minutes.  Signed: Oswald Hillock, MD [facility] 12/18/2021

## 2021-12-18 NOTE — Progress Notes (Addendum)
Reviewed orthostatic vital signs q 4 hours with Dr. Sharl Ma. Instructed not to continue at this time.

## 2021-12-18 NOTE — Care Management Obs Status (Signed)
MEDICARE OBSERVATION STATUS NOTIFICATION   Patient Details  Name: Jasmine Esparza MRN: [id number] Date of Birth: 11-Oct-1940   Medicare Observation Status Notification Given:  Yes    Devonte Migues G., RN 12/18/2021, 10:08 AM

## 2021-12-18 NOTE — Progress Notes (Signed)
Patient complained of headache. Returned to bed from bathroom at 915. 925 complained of feeling lightheaded and began shaking upper body at 928. BP 151/73 at 925 and patient denied feeling cold, temp 97.7 (oral). 933 resting in bed, no longer shaking. Patient and husband stated this has been happening since the first of June. ER visit at first episode and then seen by PCP.  Reviewed possible causes of low potassium and patient had none. Stated the MD thought it may be herbal vitamins. Encouraged to follow up and not take them until discussed with PCP.

## 2021-12-18 NOTE — Progress Notes (Signed)
  Echocardiogram 2D Echocardiogram has been performed.  Leta Jungling M 12/18/2021, 9:03 AM

## 2021-12-22 ENCOUNTER — Ambulatory Visit (INDEPENDENT_AMBULATORY_CARE_PROVIDER_SITE_OTHER): Payer: Medicare HMO | Admitting: Internal Medicine

## 2021-12-22 ENCOUNTER — Encounter: Payer: Self-pay | Admitting: Internal Medicine

## 2021-12-22 DIAGNOSIS — E041 Nontoxic single thyroid nodule: Secondary | ICD-10-CM | POA: Diagnosis not present

## 2021-12-22 DIAGNOSIS — R519 Headache, unspecified: Secondary | ICD-10-CM | POA: Diagnosis not present

## 2021-12-22 DIAGNOSIS — R42 Dizziness and giddiness: Secondary | ICD-10-CM | POA: Diagnosis not present

## 2021-12-22 DIAGNOSIS — R030 Elevated blood-pressure reading, without diagnosis of hypertension: Secondary | ICD-10-CM

## 2021-12-22 NOTE — Assessment & Plan Note (Signed)
Still awaiting aldosterone/renin ratio. She had abrupt onset of high BP June 2023 and is currently taking amlodipine 10 mg daily and lisinopril 10 mg daily. She is having dizziness which is persistent. We will have her taking 1 medication in the morning and 1 in the evening. Keep both medications as her BP is at goal today and headaches improved. Depending on aldosterone renin results may need referral to cardiology or nephrology. Adjust as needed and follow up here in 2-4 weeks to assess BP and dizziness.

## 2021-12-22 NOTE — Assessment & Plan Note (Signed)
This is resolved with BP control and workup to rule out vasculitis was able to rule this out. She still has mild pressure around her eyes. Will continue with good BP management.

## 2021-12-22 NOTE — Progress Notes (Signed)
   Subjective:   Patient ID: Jasmine Esparza, female    DOB: [birth year], 81 y.o.   MRN: [id number]  HPI The patient is a 81 YO female coming in for hospital follow up and ongoing concerns.   PMh, [facility], social history reviewed and updated  Review of Systems  Constitutional: Negative.   HENT: Negative.    Eyes: Negative.   Respiratory:  Negative for cough, chest tightness and shortness of breath.   Cardiovascular:  Negative for chest pain, palpitations and leg swelling.  Gastrointestinal:  Negative for abdominal distention, abdominal pain, constipation, diarrhea, nausea and vomiting.  Musculoskeletal: Negative.   Skin: Negative.   Neurological:  Positive for dizziness.  Psychiatric/Behavioral: Negative.      Objective:  Physical Exam Constitutional:      Appearance: She is well-developed.  HENT:     Head: Normocephalic and atraumatic.  Cardiovascular:     Rate and Rhythm: Normal rate and regular rhythm.  Pulmonary:     Effort: Pulmonary effort is normal. No respiratory distress.     Breath sounds: Normal breath sounds. No wheezing or rales.  Abdominal:     General: Bowel sounds are normal. There is no distension.     Palpations: Abdomen is soft.     Tenderness: There is no abdominal tenderness. There is no rebound.  Musculoskeletal:     Cervical back: Normal range of motion.  Skin:    General: Skin is warm and dry.  Neurological:     Mental Status: She is alert and oriented to person, place, and time.     Coordination: Coordination normal.     Vitals:   12/22/21 0955  BP: 118/84  Pulse: 77  Resp: 18  SpO2: 97%  Weight: 121 lb 12.8 oz (55.2 kg)  Height: 5\' 1"  (1.549 m)    Assessment & Plan:  Visit time 20 minutes in face to face communication with patient and coordination of care, additional 10 minutes spent in record review, coordination or care, ordering tests, communicating/referring to other healthcare professionals, documenting in medical records all on the  same day of the visit for total time 30 minutes spent on the visit.

## 2021-12-22 NOTE — Assessment & Plan Note (Signed)
Imaging reviewed with patient and husband and imaging was benign with no indication for follow up imaging.

## 2021-12-22 NOTE — Patient Instructions (Addendum)
We will let you know about the lab results.

## 2021-12-22 NOTE — Assessment & Plan Note (Signed)
Still persistent and hard to tell if improved. This is only with change in position and not when stationary. It is unclear if there is some adrenal dysfunction due to illness recently.

## 2021-12-26 LAB — ALDOSTERONE + RENIN ACTIVITY W/ RATIO
ALDO / PRA Ratio: <2.2 (ref 0.0–30.0)
Aldosterone: <1 ng/dL (ref 0.0–30.0)
PRA LC/MS/MS: 0.455 ng/mL/hr (ref 0.167–5.380)

## 2021-12-29 NOTE — Telephone Encounter (Signed)
Pt called back stating she is taking 2 BP medications: amLODipine (NORVASC) 10 MG tablet lisinopril (ZESTRIL) 10 MG tablet  Pt reports feeling tired all the time and swelling in feet/ankle area.  Pt is requesting to reduce the dosage on both medications.   132/79 is last BP taken.   Pt is requesting CB [phone]

## 2021-12-29 NOTE — Telephone Encounter (Signed)
Would recommend to keep lisinopril 10 mg daily and cut amlodipine in half to 5 mg daily. Keep follow up as scheduled.

## 2021-12-31 NOTE — Telephone Encounter (Signed)
Spoke with the patient and she verbalized understanding Dr. Crawford's recommendations. No other questions or concerns at this time.  

## 2022-01-04 ENCOUNTER — Telehealth: Payer: Self-pay

## 2022-01-04 NOTE — Telephone Encounter (Signed)
Stop lisinopril.  Monitor blood pressure readings daily for the next several weeks and keep a log.

## 2022-01-04 NOTE — Telephone Encounter (Signed)
Spoke with the patient and she verbalized understanding recommendations that came from Dr. Terence Lux. She was very appreciative for the call back

## 2022-01-04 NOTE — Telephone Encounter (Signed)
Pt states she would like to reduce her BP medication as she says she feels fine and when she takes the LISINOPRIL 10MG  it makes her feel groggy, dizzy and causing swelling in her feet and ankles.  **Please call pt with further instructions.

## 2022-01-06 ENCOUNTER — Telehealth: Payer: Self-pay

## 2022-01-06 NOTE — Telephone Encounter (Signed)
Can take her amlodipine and lisinopril at her normal time whenever that is.    Her BP is not concerning and her HR is normal which is reassuring.    I would recommend monitoring her BP and HR no more than 1-2 times a day and follow up with PCP to review

## 2022-01-06 NOTE — Telephone Encounter (Signed)
Pt called in to let me know she woke up at 2 am this morning feeling like her blood pressure was elevated and was experiencing palpitations. She proceeded to go downstairs to take a half of her amlodipine. Her husband checked her blood pressure at 9 am this morning and it was 136/82. She still feels like her heart is racing still. Pulse at present is 84 bpm. She would like to know what time she should take her blood pressure medication. She sounded very anxious on the phone.

## 2022-01-06 NOTE — Telephone Encounter (Signed)
Spoke with the patient and she verbalized understanding the recommendations from Dr. Lawerance Bach. No other questions or concerns.

## 2022-01-07 ENCOUNTER — Ambulatory Visit (INDEPENDENT_AMBULATORY_CARE_PROVIDER_SITE_OTHER): Payer: Medicare HMO | Admitting: Family Medicine

## 2022-01-07 ENCOUNTER — Encounter: Payer: Self-pay | Admitting: Family Medicine

## 2022-01-07 VITALS — BP 148/84 | HR 79 | Temp 98.0°F | Ht 61.0 in | Wt 121.0 lb

## 2022-01-07 DIAGNOSIS — R6889 Other general symptoms and signs: Secondary | ICD-10-CM | POA: Diagnosis not present

## 2022-01-07 DIAGNOSIS — R69 Illness, unspecified: Secondary | ICD-10-CM | POA: Diagnosis not present

## 2022-01-07 DIAGNOSIS — F418 Other specified anxiety disorders: Secondary | ICD-10-CM

## 2022-01-07 DIAGNOSIS — R002 Palpitations: Secondary | ICD-10-CM

## 2022-01-07 DIAGNOSIS — R42 Dizziness and giddiness: Secondary | ICD-10-CM | POA: Diagnosis not present

## 2022-01-07 DIAGNOSIS — E876 Hypokalemia: Secondary | ICD-10-CM | POA: Diagnosis not present

## 2022-01-07 DIAGNOSIS — R030 Elevated blood-pressure reading, without diagnosis of hypertension: Secondary | ICD-10-CM | POA: Diagnosis not present

## 2022-01-07 MED ORDER — ALPRAZOLAM 0.25 MG PO TABS: 0.2500 mg | ORAL_TABLET | Freq: Two times a day (BID) | ORAL | 0 refills | Status: DC | PRN

## 2022-01-07 NOTE — Progress Notes (Unsigned)
Subjective:     Patient ID: Jasmine Esparza, female    DOB: [birth year], 81 y.o.   MRN: [id number]  Chief Complaint  Patient presents with   Blood Pressure Check    This morning 149/89, last night 139/83 states this has been making her dizzy, starting to get a headache even though she has already taken a tylenol. Mentions that for at least a week she has been getting palpitations around 8:30-9 pm and lasts for a few hours until she can sleep.  Has been going on since the beginning of June.    HPI Patient is in today for palpitations    Heart racing for hours. Starts in the evening, around the same time.      Health Maintenance Due  Topic Date Due   Hepatitis C Screening  Never done   Zoster Vaccines- Shingrix (1 of 2) Never done   COVID-19 Vaccine (4 - Pfizer series) 08/23/2021   INFLUENZA VACCINE  12/28/2021    Past Medical History:  Diagnosis Date   ALLERGIC RHINITIS    ECZEMA    GOITER, MULTINODULAR    s/p I-131 ablation 10/2009   Hypercalcemia    HYPERLIPIDEMIA    Hypertension    OSTEOPOROSIS     Past Surgical History:  Procedure Laterality Date   CATARACT EXTRACTION W/ INTRAOCULAR LENS IMPLANT Bilateral 2013    Family History  Problem Relation Age of Onset   Stroke Mother    Arthritis Mother    Hypertension Mother    Prostate cancer Father    Arthritis Father    Hypertension Sister    Osteoporosis Sister    Hypertension Brother    Hyperlipidemia Brother     Social History   Socioeconomic History   Marital status: Married    Spouse name: Not on file   Number of children: 3   Years of education: Not on file   Highest education level: Not on file  Occupational History   Not on file  Tobacco Use   Smoking status: Never    Passive exposure: Never   Smokeless tobacco: Never   Tobacco comments:    Married, lives with spouse. retired  Building services engineer Use: Never used  Substance and Sexual Activity   Alcohol use: No   Drug use: No   Sexual  activity: Not Currently  Other Topics Concern   Not on file  Social History Narrative   Not on file   Social Determinants of Health   Financial Resource Strain: Low Risk  (11/23/2017)   Overall Financial Resource Strain (CARDIA)    Difficulty of Paying Living Expenses: Not hard at all  Food Insecurity: No Food Insecurity (11/23/2017)   Hunger Vital Sign    Worried About Running Out of Food in the Last Year: Never true    Ran Out of Food in the Last Year: Never true  Transportation Needs: No Transportation Needs (11/23/2017)   PRAPARE - Administrator, Civil Service (Medical): No    Lack of Transportation (Non-Medical): No  Physical Activity: Sufficiently Active (11/23/2017)   Exercise Vital Sign    Days of Exercise per Week: 5 days    Minutes of Exercise per Session: 40 min  Stress: No Stress Concern Present (11/23/2017)   [employer] of Occupational Health - Occupational Stress Questionnaire    Feeling of Stress : Not at all  Social Connections: Socially Integrated (11/23/2017)   Social Connection and Isolation Panel [NHANES]  Frequency of Communication with Friends and Family: More than three times a week    Frequency of Social Gatherings with Friends and Family: More than three times a week    Attends Religious Services: More than 4 times per year    Active Member of [employer] or Organizations: Yes    Attends Engineer, structural: More than 4 times per year    Marital Status: Married  Catering manager Violence: Not At Risk (11/23/2017)   Humiliation, Afraid, Rape, and Kick questionnaire    Fear of Current or Ex-Partner: No    Emotionally Abused: No    Physically Abused: No    Sexually Abused: No    Outpatient Medications Prior to Visit  Medication Sig Dispense Refill   acetaminophen (TYLENOL) 500 MG tablet Take 500 mg by mouth 2 (two) times daily.     amLODipine (NORVASC) 10 MG tablet Take 1 tablet (10 mg total) by mouth daily. 30 tablet 3   denosumab  (PROLIA) 60 MG/ML SOSY injection Inject 1 mL into the skin.     lisinopril (ZESTRIL) 10 MG tablet Take 1 tablet (10 mg total) by mouth daily. 30 tablet 2   No facility-administered medications prior to visit.    Allergies  Allergen Reactions   Codeine     REACTION: nausea    ROS     Objective:    Physical Exam  BP (!) 148/84 (BP Location: Left Arm, Patient Position: Sitting, Cuff Size: Large)   Pulse 79   Temp 98 F (36.7 C) (Temporal)   Ht 5\' 1"  (1.549 m)   Wt 121 lb (54.9 kg)   SpO2 98%   BMI 22.86 kg/m  Wt Readings from Last 3 Encounters:  01/07/22 121 lb (54.9 kg)  12/22/21 121 lb 12.8 oz (55.2 kg)  12/17/21 118 lb 8 oz (53.8 kg)       Assessment & Plan:   Problem List Items Addressed This Visit       Other   Dizziness   Relevant Orders   Ambulatory referral to Cardiology   Other Visit Diagnoses     Elevated blood pressure reading    -  Primary   Palpitations       Relevant Orders   Ambulatory referral to Cardiology   Basic metabolic panel   Anxiety about health       Relevant Medications   ALPRAZolam (XANAX) 0.25 MG tablet   Blood pressure alteration       Relevant Orders   Ambulatory referral to Cardiology   Hypokalemia       Relevant Orders   Basic metabolic panel       I am having 12/19/21. Sgroi start on ALPRAZolam. I am also having her maintain her Prolia, acetaminophen, amLODipine, and lisinopril.  Meds ordered this encounter  Medications   ALPRAZolam (XANAX) 0.25 MG tablet    Sig: Take 1 tablet (0.25 mg total) by mouth 2 (two) times daily as needed for anxiety.    Dispense:  20 tablet    Refill:  0    Order Specific Question:   Supervising Provider    Answer:   [name] A [[id number]]

## 2022-01-07 NOTE — Patient Instructions (Signed)
Keep an eye on your blood pressure and pulse as discussed.  If you feel your pulse racing this evening, check your watch, check your blood pressure monitor and palpate your pulse as demonstrated in the office.  See if your pulse is actually beating fast and is not regular or irregular.  Take some deep breaths and do distraction techniques to see if it improves your fast heart beat.  You can take the alprazolam prescribed and see if this makes a difference as well.  If no improvement, call 911 or go to the emergency department so that they can evaluate your heart rhythm.  I have referred you to cardiology for further evaluation including the event monitor we discussed.

## 2022-01-09 DIAGNOSIS — E876 Hypokalemia: Secondary | ICD-10-CM | POA: Insufficient documentation

## 2022-01-09 DIAGNOSIS — R002 Palpitations: Secondary | ICD-10-CM | POA: Insufficient documentation

## 2022-01-09 DIAGNOSIS — R6889 Other general symptoms and signs: Secondary | ICD-10-CM | POA: Insufficient documentation

## 2022-01-09 NOTE — Assessment & Plan Note (Signed)
Discussed trying a short course of alprazolam to see if anxiety is contributing to symptoms. She is amenable to trying medication. Discussed that this medication is for short term use only and may cause sedation and to use caution.

## 2022-01-09 NOTE — Assessment & Plan Note (Signed)
Unchanged from previous work ups and ongoing.

## 2022-01-09 NOTE — Assessment & Plan Note (Signed)
Continue lisinopril and amlodipine and follow up with PCP next week. I have also referred her to cardiologist per request.

## 2022-01-09 NOTE — Assessment & Plan Note (Signed)
Stat BMP ordered. She is reluctant to have this done today.

## 2022-01-09 NOTE — Assessment & Plan Note (Addendum)
Reportedly occurring every evening around 8:30 pm and lasting approximately 3 hours. Taught patient to check her pulse and see if it is actually beating fast or if it is regular or irregular. She will also check her watch and BP monitor to see how her BP and pulse look. If fast heart rate is not improving with deep breaths, distraction techniques then she will call 911 or go to the ED (over the weekend) for evaluation while symptomatic. Referral to cardiologist per patient request.

## 2022-01-12 ENCOUNTER — Ambulatory Visit (INDEPENDENT_AMBULATORY_CARE_PROVIDER_SITE_OTHER): Payer: Medicare HMO | Admitting: Internal Medicine

## 2022-01-12 DIAGNOSIS — R1011 Right upper quadrant pain: Secondary | ICD-10-CM | POA: Diagnosis not present

## 2022-01-12 DIAGNOSIS — R519 Headache, unspecified: Secondary | ICD-10-CM | POA: Diagnosis not present

## 2022-01-12 DIAGNOSIS — I10 Essential (primary) hypertension: Secondary | ICD-10-CM

## 2022-01-12 DIAGNOSIS — R42 Dizziness and giddiness: Secondary | ICD-10-CM | POA: Diagnosis not present

## 2022-01-12 DIAGNOSIS — F418 Other specified anxiety disorders: Secondary | ICD-10-CM

## 2022-01-12 DIAGNOSIS — R4589 Other symptoms and signs involving emotional state: Secondary | ICD-10-CM

## 2022-01-12 DIAGNOSIS — R69 Illness, unspecified: Secondary | ICD-10-CM | POA: Diagnosis not present

## 2022-01-12 NOTE — Patient Instructions (Signed)
We will have you tomorrow take 1 blood pressure medicine in the morning and the other in the evening.   The next day take both in the evening to see if this helps reduce the dizziness.

## 2022-01-12 NOTE — Progress Notes (Signed)
   Subjective:   Patient ID: Jasmine Esparza, female    DOB: 04/24/1941, 81 y.o.   MRN: [id number]  HPI The patient is a 81 YO female coming in for follow up BP and dizziness.  Review of Systems  Constitutional: Negative.   HENT: Negative.    Eyes: Negative.   Respiratory:  Negative for cough, chest tightness and shortness of breath.   Cardiovascular:  Negative for chest pain, palpitations and leg swelling.  Gastrointestinal:  Positive for abdominal pain. Negative for abdominal distention, constipation, diarrhea, nausea and vomiting.  Musculoskeletal: Negative.   Skin: Negative.   Neurological:  Positive for dizziness and headaches.  Psychiatric/Behavioral: Negative.      Objective:  Physical Exam Constitutional:      Appearance: She is well-developed.  HENT:     Head: Normocephalic and atraumatic.  Cardiovascular:     Rate and Rhythm: Normal rate and regular rhythm.  Pulmonary:     Effort: Pulmonary effort is normal. No respiratory distress.     Breath sounds: Normal breath sounds. No wheezing or rales.  Abdominal:     General: Bowel sounds are normal. There is no distension.     Palpations: Abdomen is soft.     Tenderness: There is no abdominal tenderness. There is no rebound.  Musculoskeletal:        General: Tenderness present.     Cervical back: Normal range of motion.  Skin:    General: Skin is warm and dry.  Neurological:     Mental Status: She is alert and oriented to person, place, and time.     Coordination: Coordination normal.     Vitals:   01/12/22 1000  BP: 138/72  Pulse: 75  Temp: 98 F (36.7 C)  TempSrc: Oral  SpO2: 97%  Weight: 122 lb (55.3 kg)  Height: 5\' 1"  (1.549 m)    Assessment & Plan:

## 2022-01-14 ENCOUNTER — Encounter: Payer: Self-pay | Admitting: Internal Medicine

## 2022-01-14 NOTE — Assessment & Plan Note (Signed)
This is still mostly resolved with BP control although she is still getting pressure behind the left eye at times.

## 2022-01-14 NOTE — Assessment & Plan Note (Signed)
She was given prescription for xanax 0.25 mg prn recently and has not tried. Advised she can try 1/2 to see if this helps. Advised this could cause drowsiness or dizziness. We talked about deep breathing, meditation for coping skills.

## 2022-01-14 NOTE — Assessment & Plan Note (Signed)
BP is now at goal. Will have her move medications to evening instead of morning for BP to see if this helps. Tomorrow take 1 in morning and 1 in evening. Next day take both in the evening and continue with that routine. Keep lisinopril 10 mg daily and amlodipine 10 mg daily.

## 2022-01-14 NOTE — Assessment & Plan Note (Signed)
Overall improving but still occasionally present. CT without acute findings to explain.

## 2022-01-14 NOTE — Assessment & Plan Note (Signed)
BP at goal on lisinopril 10 mg daily and amlodipine 10 mg daily. Given several recent hospital stays with severely elevated readings she is advised not to stop medications even with dizziness. We are trying to move medications to evening to see if this reduces daytime dizziness. She has been referred to cardiology to see if they can add any help with etiology. Secondary evaluation has been negative to date.

## 2022-01-26 ENCOUNTER — Ambulatory Visit (HOSPITAL_COMMUNITY)
Admission: RE | Admit: 2022-01-26 | Discharge: 2022-01-26 | Disposition: A | Discharge status: Home / Self Care | Payer: Medicare HMO | Source: Ambulatory Visit | Attending: Internal Medicine | Admitting: Internal Medicine

## 2022-01-26 ENCOUNTER — Ambulatory Visit (INDEPENDENT_AMBULATORY_CARE_PROVIDER_SITE_OTHER): Payer: Medicare HMO | Admitting: Internal Medicine

## 2022-01-26 ENCOUNTER — Encounter: Payer: Self-pay | Admitting: Internal Medicine

## 2022-01-26 ENCOUNTER — Telehealth: Payer: Self-pay | Admitting: Internal Medicine

## 2022-01-26 VITALS — BP 160/80 | HR 102 | Temp 98.4°F | Ht 61.0 in | Wt 122.0 lb

## 2022-01-26 DIAGNOSIS — M7989 Other specified soft tissue disorders: Secondary | ICD-10-CM | POA: Insufficient documentation

## 2022-01-26 DIAGNOSIS — I10 Essential (primary) hypertension: Secondary | ICD-10-CM | POA: Diagnosis not present

## 2022-01-26 DIAGNOSIS — R42 Dizziness and giddiness: Secondary | ICD-10-CM

## 2022-01-26 MED ORDER — MECLIZINE HCL 12.5 MG PO TABS: 12.5000 mg | ORAL_TABLET | Freq: Three times a day (TID) | ORAL | 0 refills | Status: DC | PRN

## 2022-01-26 NOTE — Progress Notes (Signed)
   Subjective:   Patient ID: Jasmine Esparza, female    DOB: July 19, 1940, 81 y.o.   MRN: [id number]  HPI The patient is a 81 YO female coming in for concerns.  Review of Systems  Constitutional: Negative.   HENT: Negative.    Eyes: Negative.   Respiratory:  Negative for cough, chest tightness and shortness of breath.   Cardiovascular:  Positive for leg swelling. Negative for chest pain and palpitations.  Gastrointestinal:  Negative for abdominal distention, abdominal pain, constipation, diarrhea, nausea and vomiting.  Musculoskeletal: Negative.   Skin: Negative.   Neurological:  Positive for dizziness and headaches.  Psychiatric/Behavioral: Negative.      Objective:  Physical Exam Constitutional:      Appearance: She is well-developed.  HENT:     Head: Normocephalic and atraumatic.  Cardiovascular:     Rate and Rhythm: Normal rate and regular rhythm.  Pulmonary:     Effort: Pulmonary effort is normal. No respiratory distress.     Breath sounds: Normal breath sounds. No wheezing or rales.  Abdominal:     General: Bowel sounds are normal. There is no distension.     Palpations: Abdomen is soft.     Tenderness: There is no abdominal tenderness. There is no rebound.  Musculoskeletal:     Cervical back: Normal range of motion.     Right lower leg: Edema present.     Left lower leg: Edema present.  Skin:    General: Skin is warm and dry.  Neurological:     Mental Status: She is alert and oriented to person, place, and time.     Coordination: Coordination normal.     Vitals:   01/26/22 0941 01/26/22 0947  BP: (!) 162/80 (!) 160/80  Pulse: (!) 102   Temp: 98.4 F (36.9 C)   TempSrc: Oral   SpO2: 98%   Weight: 122 lb (55.3 kg)   Height: 5\' 1"  (1.549 m)     Assessment & Plan:

## 2022-01-26 NOTE — Assessment & Plan Note (Signed)
Checking US venous bilateral to rule out blood clot. Ordered stat today. She has been much less active due to illness.

## 2022-01-26 NOTE — Patient Instructions (Signed)
We will get the scan for the blood clot today.  Stop taking amlodipine.   Starting tonight take 2 pills of lisinopril (20 mg total) to help control blood pressure.  We have sent in meclizine 12.5 mg to try for the dizziness to see if this helps.

## 2022-01-26 NOTE — Assessment & Plan Note (Signed)
Rx meclizine 12.5 mg that she can try for dizziness since we have controlled BP and she is still having a lot of dizziness. Moving BP meds to evening did not help consistently. Described as 24/7 dizziness and sometimes worse with moving head.

## 2022-01-26 NOTE — Assessment & Plan Note (Signed)
BP stable at home but new leg swelling. Will stop amlodipine 10 mg daily and increase lisinopril 20 mg daily to cover BP. She has had a lot of medicine intolerances so will not add additional medication.

## 2022-01-26 NOTE — Telephone Encounter (Signed)
Alecia with [facility] calls today regarding PT's recent visit with them. PT is currently showing no DVT in either legs, just mild superficial edema. PT was informed to get up with [place] on how to proceed.

## 2022-01-27 NOTE — Telephone Encounter (Signed)
Keep plan from visit.

## 2022-01-27 NOTE — Telephone Encounter (Signed)
Spoke with patient today. 

## 2022-02-07 ENCOUNTER — Telehealth: Payer: Self-pay | Admitting: Internal Medicine

## 2022-02-07 ENCOUNTER — Other Ambulatory Visit: Payer: Self-pay

## 2022-02-07 ENCOUNTER — Emergency Department (HOSPITAL_BASED_OUTPATIENT_CLINIC_OR_DEPARTMENT_OTHER)
Admission: EM | Admit: 2022-02-07 | Discharge: 2022-02-08 | Disposition: A | Discharge status: Home / Self Care | Payer: Medicare HMO | Attending: Emergency Medicine | Admitting: Emergency Medicine

## 2022-02-07 ENCOUNTER — Encounter (HOSPITAL_BASED_OUTPATIENT_CLINIC_OR_DEPARTMENT_OTHER): Payer: Self-pay

## 2022-02-07 DIAGNOSIS — Z79899 Other long term (current) drug therapy: Secondary | ICD-10-CM | POA: Diagnosis not present

## 2022-02-07 DIAGNOSIS — I10 Essential (primary) hypertension: Secondary | ICD-10-CM | POA: Insufficient documentation

## 2022-02-07 DIAGNOSIS — E876 Hypokalemia: Secondary | ICD-10-CM | POA: Insufficient documentation

## 2022-02-07 NOTE — Telephone Encounter (Signed)
Called pt stated--b/p reading are 173/80, 147/80, 185/105--and have mild headache, feels hear beat fast. Notified pt if symptoms getting worse to please go to ER or call 911. Pt voiced understanding. Please advise.

## 2022-02-07 NOTE — ED Triage Notes (Addendum)
Reports HTN at home (191/103). Mild headache.   Sts she was feeling palpitations and has had weakness all day so she checked bp at home.   Took 1/2 xanax and 10 mg of lisinopril.   Also c/o shaking tonight and suspects hypokalemia.

## 2022-02-07 NOTE — Telephone Encounter (Signed)
Patient's blood pressure was 173/93 - she wants to know if she should take another blood pressure pill.  Please advise.

## 2022-02-08 LAB — COMPREHENSIVE METABOLIC PANEL
ALT: 13 U/L (ref 0–44)
AST: 18 U/L (ref 15–41)
Albumin: 4.8 g/dL (ref 3.5–5.0)
Alkaline Phosphatase: 65 U/L (ref 38–126)
Anion gap: 7 (ref 5–15)
BUN: 19 mg/dL (ref 8–23)
CO2: 32 mmol/L (ref 22–32)
Calcium: 10.2 mg/dL (ref 8.9–10.3)
Chloride: 102 mmol/L (ref 98–111)
Creatinine, Ser: 0.73 mg/dL (ref 0.44–1.00)
GFR, Estimated: >60 mL/min (ref >60)
Glucose, Bld: 96 mg/dL (ref 70–99)
Potassium: 3.3 mmol/L — ABNORMAL LOW (ref 3.5–5.1)
Sodium: 141 mmol/L (ref 135–145)
Total Bilirubin: 0.5 mg/dL (ref 0.3–1.2)
Total Protein: 7.4 g/dL (ref 6.5–8.1)

## 2022-02-08 LAB — CBC WITH DIFFERENTIAL/PLATELET
Abs Immature Granulocytes: 0.02 10*3/uL (ref 0.00–0.07)
Basophils Absolute: 0 10*3/uL (ref 0.0–0.1)
Basophils Relative: 1 %
Eosinophils Absolute: 0.2 10*3/uL (ref 0.0–0.5)
Eosinophils Relative: 2 %
HCT: 42.6 % (ref 36.0–46.0)
Hemoglobin: 14.5 g/dL (ref 12.0–15.0)
Immature Granulocytes: 0 %
Lymphocytes Relative: 21 %
Lymphs Abs: 1.4 10*3/uL (ref 0.7–4.0)
MCH: 31.4 pg (ref 26.0–34.0)
MCHC: 34 g/dL (ref 30.0–36.0)
MCV: 92.2 fL (ref 80.0–100.0)
Monocytes Absolute: 0.8 10*3/uL (ref 0.1–1.0)
Monocytes Relative: 12 %
Neutro Abs: 4.3 10*3/uL (ref 1.7–7.7)
Neutrophils Relative %: 64 %
Platelets: 285 10*3/uL (ref 150–400)
RBC: 4.62 MIL/uL (ref 3.87–5.11)
RDW: 12.3 % (ref 11.5–15.5)
WBC: 6.7 10*3/uL (ref 4.0–10.5)
nRBC: 0 % (ref 0.0–0.2)

## 2022-02-08 LAB — URINALYSIS, ROUTINE W REFLEX MICROSCOPIC
Bilirubin Urine: NEGATIVE
Glucose, UA: NEGATIVE mg/dL
Hgb urine dipstick: NEGATIVE
Ketones, ur: NEGATIVE mg/dL
Nitrite: NEGATIVE
Protein, ur: NEGATIVE mg/dL
Specific Gravity, Urine: 1.008 (ref 1.005–1.030)
pH: 7.5 (ref 5.0–8.0)

## 2022-02-08 MED ORDER — POTASSIUM CHLORIDE CRYS ER 20 MEQ PO TBCR: EXTENDED_RELEASE_TABLET | ORAL | 0 refills | Status: DC

## 2022-02-08 MED ORDER — POTASSIUM CHLORIDE CRYS ER 20 MEQ PO TBCR
40.0000 meq | EXTENDED_RELEASE_TABLET | Freq: Once | ORAL | Status: AC
  Administered 2022-02-08: 40 meq via ORAL
  Filled 2022-02-08: qty 2

## 2022-02-08 NOTE — ED Notes (Signed)
Pt verbalizes understanding of discharge instructions. Opportunity for questioning and answers were provided. Pt discharged from ED to home with son.    

## 2022-02-08 NOTE — Telephone Encounter (Signed)
What BP meds is she taking now? Plan from last visit was lisinopril 20 mg daily.

## 2022-02-08 NOTE — ED Provider Notes (Signed)
[facility] EMERGENCY DEPT Provider Note   CSN: [id number] Arrival date & time: 02/07/22  2247     History  Chief Complaint  Patient presents with   Hypertension    Jasmine Esparza is a 81 y.o. female.  The history is provided by the patient.  Hypertension  She has history of hypertension, hyperlipidemia, hypokalemia and comes in because she was feeling generally weak today and was concerned that her potassium might be low.  She also checked her blood pressure because she was feeling bad and noted that it was very high at 190/103.  She has had a very mild headache which is since resolved.  She denies any chest pain, heaviness, tightness, pressure.  She denies tinnitus or epistaxis.  She has not checked her blood pressure for at least several weeks before today.   Home Medications Prior to Admission medications   Medication Sig Start Date End Date Taking? Authorizing Provider  acetaminophen (TYLENOL) 500 MG tablet Take 500 mg by mouth 2 (two) times daily.    [provider]  ALPRAZolam Prudy Feeler) 0.25 MG tablet Take 1 tablet (0.25 mg total) by mouth 2 (two) times daily as needed for anxiety. 01/07/22   Henson, Vickie L, NP-C  amLODipine (NORVASC) 10 MG tablet Take 1 tablet (10 mg total) by mouth daily. 12/19/21   Meredeth Ide, MD  denosumab (PROLIA) 60 MG/ML SOSY injection Inject 1 mL into the skin.    [provider]  lisinopril (ZESTRIL) 10 MG tablet Take 1 tablet (10 mg total) by mouth daily. 12/18/21   Meredeth Ide, MD  meclizine (ANTIVERT) 12.5 MG tablet Take 1 tablet (12.5 mg total) by mouth 3 (three) times daily as needed for dizziness. 01/26/22   Myrlene Broker, MD      Allergies    Codeine    Review of Systems   Review of Systems  All other systems reviewed and are negative.   Physical Exam Updated Vital Signs BP (!) 185/83   Pulse 65   Temp 98.1 F (36.7 C)   Resp 16   Ht 5\' 1"  (1.549 m)   Wt 55.3 kg   SpO2 96%   BMI 23.05  kg/m  Physical Exam Vitals and nursing note reviewed.   81 year old female, resting comfortably and in no acute distress. Vital signs are significant for elevated blood pressure. Oxygen saturation is 96%, which is normal. Head is normocephalic and atraumatic. PERRLA, EOMI. Oropharynx is clear. Neck is nontender and supple without adenopathy or JVD. Back is nontender and there is no CVA tenderness. Lungs are clear without rales, wheezes, or rhonchi. Chest is nontender. Heart has regular rate and rhythm without murmur. Abdomen is soft, flat, nontender. Extremities have no cyanosis or edema, full range of motion is present. Skin is warm and dry without rash. Neurologic: Mental status is normal, cranial nerves are intact, moves all extremities equally.  ED Results / Procedures / Treatments   Labs (all labs ordered are listed, but only abnormal results are displayed) Labs Reviewed  COMPREHENSIVE METABOLIC PANEL - Abnormal; Notable for the following components:      Result Value   Potassium 3.3 (*)    All other components within normal limits  URINALYSIS, ROUTINE W REFLEX MICROSCOPIC - Abnormal; Notable for the following components:   Color, Urine COLORLESS (*)    Leukocytes,Ua TRACE (*)    All other components within normal limits  CBC WITH DIFFERENTIAL/PLATELET    EKG EKG Interpretation  Date/Time:  Tuesday February 08 2022 00:00:26 EDT Ventricular Rate:  67 PR Interval:  147 QRS Duration: 92 QT Interval:  428 QTC Calculation: 452 R Axis:   64 Text Interpretation: Sinus rhythm Minimal ST depression, inferior leads When compared with ECG of 12/16/2021, No significant change was found Confirmed by Dione Booze [phone]) on 02/08/2022 12:03:20 AM  Radiology No results found.  Procedures Procedures    Medications Ordered in ED Medications  potassium chloride SA (KLOR-CON M) CR tablet 40 mEq (40 mEq Oral Given 02/08/22 0218)    ED Course/ Medical Decision Making/ A&P                            Medical Decision Making Amount and/or Complexity of Data Reviewed Labs: ordered. ECG/medicine tests: ordered.  Risk Prescription drug management.   Elevated blood pressure in patient with recent history of hypertension.  I have reviewed her old records, and she was routinely running a normal blood pressure up to and including an office visit on 10/29/2021.  Since then, starting with an ED visit on 11/01/2021, blood pressure has been consistently very elevated including during a hospital admission on 12/16/2021 for hypertensive urgency.  She has also had hypokalemia since 11/16/2021.  On several ED visits and hospitalization, she was given potassium during that visit but not discharged with any potassium supplements and she is not on any diuretics to cause her hypokalemia.  Blood pressure today is elevated but not to a dangerous level.  I am curious as to why her blood pressure became significantly elevated rather abruptly and why she has had persistent hypokalemia.  She did have normal aldosterone, cortisol levels on 12/18/2021, and had normal renal arteries and normal adrenal glands on his CT angiogram on 12/12/2021.  I have reviewed and interpreted her laboratory tests today, and my interpretation is hypokalemia with potassium 3.3 and otherwise normal metabolic panel and CBC and normal urinalysis.  She clearly is going to need ongoing potassium supplementation and I have ordered oral potassium for her here and a prescription for potassium to take at home.  I have requested that she start monitoring her blood pressure at home on a daily basis and to work with her primary care provider regarding both her potassium supplementation and antihypertensive regimen.  I have recommended she consider nephrology consultation regarding sudden change in blood pressure and sudden onset of hypokalemia to evaluate whether she should be checked for pheochromocytoma (unlikely in the setting of normal CT  angiogram of the abdomen).  Final Clinical Impression(s) / ED Diagnoses Final diagnoses:  Elevated blood pressure reading with diagnosis of hypertension  Hypokalemia    Rx / DC Orders ED Discharge Orders          Ordered    potassium chloride SA (KLOR-CON M) 20 MEQ tablet        02/08/22 0210              Dione Booze, MD 02/08/22 0230

## 2022-02-08 NOTE — Telephone Encounter (Signed)
Okay to keep cardiology follow up

## 2022-02-08 NOTE — Telephone Encounter (Signed)
Spoke to pt--verified taking lisinopril 20 mg daily and feeling much better. Pt did went to ER--for low potassium and elevated B/p and pt mention have an appt with cardiology.

## 2022-02-08 NOTE — Discharge Instructions (Addendum)
Please try to stay on a low-salt diet.  Please check your blood pressure once or twice a day and keep a record of it.  This should be something that you continue to do on a daily basis, even if you are feeling better.  You should take that record with you every time you see one of your physicians.  Please discuss with your primary care provider whether you should see a kidney doctor to try to determine why your potassium has been low and why your blood pressure suddenly got very high.

## 2022-02-09 ENCOUNTER — Telehealth: Payer: Self-pay | Admitting: *Deleted

## 2022-02-09 NOTE — Telephone Encounter (Signed)
        Patient  visited [facility] on 02/08/2022  for elevated bp   Telephone encounter attempt :  1st  A HIPAA compliant voice message was left requesting a return call.  Instructed patient to call back at [phone].  Yehuda Mao Greenauer -[facility], Population Health [phone] [address] [place] , [place] [address] Email : [email or web address]

## 2022-02-10 ENCOUNTER — Emergency Department (HOSPITAL_COMMUNITY)
Admission: EM | Admit: 2022-02-10 | Discharge: 2022-02-10 | Disposition: A | Discharge status: Home / Self Care | Payer: Medicare HMO | Attending: Emergency Medicine | Admitting: Emergency Medicine

## 2022-02-10 ENCOUNTER — Encounter (HOSPITAL_COMMUNITY): Payer: Self-pay | Admitting: Emergency Medicine

## 2022-02-10 ENCOUNTER — Other Ambulatory Visit: Payer: Self-pay

## 2022-02-10 ENCOUNTER — Telehealth: Payer: Self-pay | Admitting: Internal Medicine

## 2022-02-10 ENCOUNTER — Telehealth: Payer: Self-pay | Admitting: *Deleted

## 2022-02-10 DIAGNOSIS — Z743 Need for continuous supervision: Secondary | ICD-10-CM | POA: Diagnosis not present

## 2022-02-10 DIAGNOSIS — Z79899 Other long term (current) drug therapy: Secondary | ICD-10-CM | POA: Diagnosis not present

## 2022-02-10 DIAGNOSIS — I10 Essential (primary) hypertension: Secondary | ICD-10-CM | POA: Insufficient documentation

## 2022-02-10 LAB — COMPREHENSIVE METABOLIC PANEL
ALT: 14 U/L (ref 0–44)
AST: 18 U/L (ref 15–41)
Albumin: 3.8 g/dL (ref 3.5–5.0)
Alkaline Phosphatase: 69 U/L (ref 38–126)
Anion gap: 6 (ref 5–15)
BUN: 22 mg/dL (ref 8–23)
CO2: 24 mmol/L (ref 22–32)
Calcium: 9.1 mg/dL (ref 8.9–10.3)
Chloride: 109 mmol/L (ref 98–111)
Creatinine, Ser: 0.84 mg/dL (ref 0.44–1.00)
GFR, Estimated: >60 mL/min (ref >60)
Glucose, Bld: 111 mg/dL — ABNORMAL HIGH (ref 70–99)
Potassium: 3.6 mmol/L (ref 3.5–5.1)
Sodium: 139 mmol/L (ref 135–145)
Total Bilirubin: 0.5 mg/dL (ref 0.3–1.2)
Total Protein: 6.3 g/dL — ABNORMAL LOW (ref 6.5–8.1)

## 2022-02-10 LAB — CBC WITH DIFFERENTIAL/PLATELET
Abs Immature Granulocytes: 0.02 10*3/uL (ref 0.00–0.07)
Basophils Absolute: 0 10*3/uL (ref 0.0–0.1)
Basophils Relative: 1 %
Eosinophils Absolute: 0.2 10*3/uL (ref 0.0–0.5)
Eosinophils Relative: 3 %
HCT: 41.9 % (ref 36.0–46.0)
Hemoglobin: 13.9 g/dL (ref 12.0–15.0)
Immature Granulocytes: 0 %
Lymphocytes Relative: 23 %
Lymphs Abs: 1.5 10*3/uL (ref 0.7–4.0)
MCH: 31.2 pg (ref 26.0–34.0)
MCHC: 33.2 g/dL (ref 30.0–36.0)
MCV: 93.9 fL (ref 80.0–100.0)
Monocytes Absolute: 0.8 10*3/uL (ref 0.1–1.0)
Monocytes Relative: 12 %
Neutro Abs: 4.1 10*3/uL (ref 1.7–7.7)
Neutrophils Relative %: 61 %
Platelets: 285 10*3/uL (ref 150–400)
RBC: 4.46 MIL/uL (ref 3.87–5.11)
RDW: 12.3 % (ref 11.5–15.5)
WBC: 6.6 10*3/uL (ref 4.0–10.5)
nRBC: 0 % (ref 0.0–0.2)

## 2022-02-10 LAB — MAGNESIUM: Magnesium: 2.3 mg/dL (ref 1.7–2.4)

## 2022-02-10 MED ORDER — AMLODIPINE BESYLATE 5 MG PO TABS
10.0000 mg | ORAL_TABLET | Freq: Once | ORAL | Status: AC
  Administered 2022-02-10: 10 mg via ORAL
  Filled 2022-02-10: qty 2

## 2022-02-10 NOTE — ED Triage Notes (Signed)
Patient arrived with EMS from home reports elevated blood pressure at home BP= 210/114 this evening . CBG=102 .

## 2022-02-10 NOTE — Discharge Instructions (Signed)
You are seen in the ER today for your high blood pressure.  Your physical exam vital signs and blood work are reassuring.  Please follow-up with your cardiologist as scheduled for Monday and with the nephrologist listed below.  Return to the ER with any severe symptoms and continue to take your prescribed blood pressure medication at home daily.

## 2022-02-10 NOTE — ED Provider Triage Note (Signed)
  Emergency Medicine Provider Triage Evaluation Note  MRN:  [id number]  Arrival date & time: 02/10/22    Medically screening exam initiated at 6:19 AM.   CC:   Hypertension  HPI:  Jasmine Esparza is a 81 y.o. year-old female presents to the ED with chief complaint of elevated BP.  She also thinks she's reacting to her potassium pills.  Reports tremors.  Denies numbness, weakness, tingling.  Denies chest pain or SOB.  History provided by patient. ROS:  -As included in HPI PE:   Vitals:   02/10/22 0445 02/10/22 0500  BP: (!) 158/80 (!) 173/85  Pulse: 69 77  Resp: 12 11  Temp:    SpO2: 93% 97%    Non-toxic appearing No respiratory distress  MDM:    Patient was informed that the remainder of the evaluation will be completed by another provider, this initial triage assessment does not replace that evaluation, and the importance of remaining in the ED until their evaluation is complete.    Roxy Horseman, PA-C 02/10/22 [phone]

## 2022-02-10 NOTE — Telephone Encounter (Signed)
FYi

## 2022-02-10 NOTE — ED Provider Notes (Signed)
[facility] EMERGENCY DEPARTMENT Provider Note   CSN: [id number] Arrival date & time: 02/10/22  0041     History  Chief Complaint  Patient presents with   Hypertension    BP=210/114    Jasmine Esparza is a 81 y.o. female who presents via EMS with concern for elevated blood pressure to 210/114 at home this evening.  Patient states that she was "feeling badly", unable to provide further insight into the meaning of this phrase, this evening when she took her blood pressure and found it to be significantly elevated prompting EMS call.  History of intermittent spikes in her blood pressure for which she has been seen approximately 10 times in the last 3 months without concerning finding.  Was admitted only for hypertensive urgency with very reassuring work-up at that time.  Asymptomatic at this time.  I personally reviewed her medical records.  Has history of constipation, anxiety about her health, hyperlipidemia, eczema.  She is not anticoagulated.  HPI     Home Medications Prior to Admission medications   Medication Sig Start Date End Date Taking? Authorizing Provider  acetaminophen (TYLENOL) 500 MG tablet Take 500 mg by mouth daily as needed for headache.   Yes [provider]  ALPRAZolam (XANAX) 0.25 MG tablet Take 1 tablet (0.25 mg total) by mouth 2 (two) times daily as needed for anxiety. Patient taking differently: Take 0.125 mg by mouth 2 (two) times daily as needed for anxiety. 01/07/22  Yes Henson, Vickie L, NP-C  denosumab (PROLIA) 60 MG/ML SOSY injection Inject 1 mL into the skin.   Yes [provider]  lisinopril (ZESTRIL) 10 MG tablet Take 1 tablet (10 mg total) by mouth daily. 12/18/21  Yes Sharl Ma, Sarina Ill, MD  potassium chloride SA (KLOR-CON M) 20 MEQ tablet Take one pill twice a day for 10 days, then take one pill daily Patient taking differently: Take 20 mEq by mouth See admin instructions. Take 1 tablet by mouth twice a day for 10 days, then  take 1 tablet daily 02/08/22  Yes Dione Booze, MD  amLODipine (NORVASC) 10 MG tablet Take 1 tablet (10 mg total) by mouth daily. Patient not taking: Reported on 02/10/2022 12/19/21   Meredeth Ide, MD  meclizine (ANTIVERT) 12.5 MG tablet Take 1 tablet (12.5 mg total) by mouth 3 (three) times daily as needed for dizziness. Patient not taking: Reported on 02/10/2022 01/26/22   Myrlene Broker, MD      Allergies    Codeine    Review of Systems   Review of Systems  Constitutional:  Positive for fatigue.  Respiratory: Negative.    Cardiovascular: Negative.   Gastrointestinal: Negative.     Physical Exam Updated Vital Signs BP (!) 150/78 (BP Location: Right Arm)   Pulse 75   Temp 98.8 F (37.1 C)   Resp 16   SpO2 97%  Physical Exam Vitals and nursing note reviewed.  Constitutional:      Appearance: She is not ill-appearing or toxic-appearing.  HENT:     Head: Normocephalic and atraumatic.     Mouth/Throat:     Mouth: Mucous membranes are moist.     Pharynx: No oropharyngeal exudate or posterior oropharyngeal erythema.  Eyes:     General:        Right eye: No discharge.        Left eye: No discharge.     Extraocular Movements: Extraocular movements intact.     Conjunctiva/sclera: Conjunctivae normal.  Pupils: Pupils are equal, round, and reactive to light.  Cardiovascular:     Rate and Rhythm: Normal rate and regular rhythm.     Pulses: Normal pulses.     Heart sounds: Normal heart sounds. No murmur heard. Pulmonary:     Effort: Pulmonary effort is normal. No respiratory distress.     Breath sounds: Normal breath sounds. No wheezing or rales.  Abdominal:     General: Bowel sounds are normal. There is no distension.     Palpations: Abdomen is soft.     Tenderness: There is no abdominal tenderness. There is no guarding or rebound.  Musculoskeletal:        General: No deformity.     Cervical back: Neck supple.     Right lower leg: No edema.     Left lower leg: No  edema.  Skin:    General: Skin is warm and dry.     Capillary Refill: Capillary refill takes less than 2 seconds.  Neurological:     General: No focal deficit present.     Mental Status: She is alert and oriented to person, place, and time. Mental status is at baseline.  Psychiatric:        Mood and Affect: Mood normal.     ED Results / Procedures / Treatments   Labs (all labs ordered are listed, but only abnormal results are displayed) Labs Reviewed  COMPREHENSIVE METABOLIC PANEL - Abnormal; Notable for the following components:      Result Value   Glucose, Bld 111 (*)    Total Protein 6.3 (*)    All other components within normal limits  CBC WITH DIFFERENTIAL/PLATELET  MAGNESIUM    EKG EKG Interpretation  Date/Time:  Thursday February 10 2022 00:50:48 EDT Ventricular Rate:  75 PR Interval:  132 QRS Duration: 84 QT Interval:  394 QTC Calculation: 439 R Axis:   57 Text Interpretation: Normal sinus rhythm Possible Left atrial enlargement Nonspecific ST abnormality Abnormal ECG Confirmed by Zadie Rhine ([id number]) on 02/10/2022 5:07:58 AM  Radiology No results found.  Procedures Procedures    Medications Ordered in ED Medications  amLODipine (NORVASC) tablet 10 mg (10 mg Oral Given 02/10/22 0057)    ED Course/ Medical Decision Making/ A&P                           Medical Decision Making Amount and/or Complexity of Data Reviewed Labs:     Details:   CBC without leukocytosis or anemia, CMP unremarkable, magnesium is normal.  Patient fused to repeat blood collection for evaluation of troponin. ECG/medicine tests:     Details: EKG as above with normal sinus rhythm without STEMI.   Overall patient does not appear to be in hypertensive emergency or urgency at this time.  Recommend close outpatient follow-up with cardiologist as scheduled for Tuesday of this coming week and continued usage of the medications she was prescribed for her blood pressure at home.  No  further work-up warranted in the ED at this time.  Clinical concern for emergent underlying etiology that warrant further ED work-up or inpatient management is exceedingly low. Jasmine Esparza and her husband  voiced understanding of her medical evaluation and treatment plan. Each of their questions answered to their expressed satisfaction.  Return precautions were given.  Patient is well-appearing, stable, and was discharged in good condition.  This chart was dictated using voice recognition software, Dragon. Despite the best efforts of this provider to  proofread and correct errors, errors may still occur which can change documentation meaning.  Final Clinical Impression(s) / ED Diagnoses Final diagnoses:  Hypertension, unspecified type    Rx / DC Orders ED Discharge Orders     None         Sherrilee Gilles 02/10/22 0710    Zadie Rhine, MD 02/10/22 2308

## 2022-02-10 NOTE — Telephone Encounter (Signed)
PT calls today in regards to recent Emergency Room visit. PT had went in yesterday due to a high BP (214/108) and when leaving out was recommended by Dr.Wickline to start the process for a referral to nephrology. Dr.Wickline was recommending [facility] through [facility] (I did let PT know that who we refer her out to would depend on her insurance and how soon she could be seen).  CB for PT if needed: [phone]

## 2022-02-10 NOTE — Telephone Encounter (Signed)
     Patient  visit on 02/08/2022  at [facility]  was for Hypertension   Have you been able to follow up with your primary care physician? Was advised to call kidney a specialist   The patient was to obtain any needed medicine or equipment.  Are there diet recommendations that you are having difficulty following?  Patient expresses understanding of discharge instructions and education provided has no other needs at this time.    Yehuda Mao Greenauer -[facility] [place], Population Health [phone] [address] [place] , [place] [address] Email : [email or web address]

## 2022-02-10 NOTE — ED Notes (Signed)
RN reviewed discharge instructions with pt. Pt verbalized understanding and had no further questions. VSS upon discharge.  

## 2022-02-11 NOTE — Telephone Encounter (Signed)
I would wait for cardiology visit they may be able to help as much as nephrology. If they cannot provide answers I am happy to refer to nephrology.

## 2022-02-14 ENCOUNTER — Ambulatory Visit: Payer: Medicare HMO | Attending: Cardiology | Admitting: Cardiology

## 2022-02-14 ENCOUNTER — Encounter: Payer: Self-pay | Admitting: Cardiology

## 2022-02-14 ENCOUNTER — Ambulatory Visit: Payer: Medicare HMO | Attending: Cardiology

## 2022-02-14 VITALS — BP 160/90 | HR 80 | Ht 61.0 in | Wt 122.0 lb

## 2022-02-14 DIAGNOSIS — I10 Essential (primary) hypertension: Secondary | ICD-10-CM

## 2022-02-14 DIAGNOSIS — R002 Palpitations: Secondary | ICD-10-CM | POA: Diagnosis not present

## 2022-02-14 DIAGNOSIS — Z79899 Other long term (current) drug therapy: Secondary | ICD-10-CM | POA: Diagnosis not present

## 2022-02-14 MED ORDER — SPIRONOLACTONE 25 MG PO TABS: 25.0000 mg | ORAL_TABLET | Freq: Every day | ORAL | 3 refills | Status: DC

## 2022-02-14 NOTE — Patient Instructions (Signed)
Medication Instructions:  Please discontinue your Potassium. Start Spironalactone 25 mg daily. Continue all other medications as listed.  *If you need a refill on your cardiac medications before your next appointment, please call your pharmacy*   Lab Work: Please have blood work in 2 weeks (BMP)  If you have labs (blood work) drawn today and your tests are completely normal, you will receive your results only by: [place] (if you have MyChart) OR A paper copy in the mail If you have any lab test that is abnormal or we need to change your treatment, we will call you to review the results.   Testing/Procedures: [place]- Long Term Monitor Instructions  Your physician has requested you wear a ZIO patch monitor for 14 days.  This is a single patch monitor. [employer] supplies one patch monitor per enrollment. Additional stickers are not available. Please do not apply patch if you will be having a Nuclear Stress Test,  Echocardiogram, Cardiac CT, MRI, or Chest Xray during the period you would be wearing the  monitor. The patch cannot be worn during these tests. You cannot remove and re-apply the  ZIO XT patch monitor.  Your ZIO patch monitor will be mailed 3 day USPS to your address on file. It may take 3-5 days  to receive your monitor after you have been enrolled.  Once you have received your monitor, please review the enclosed instructions. Your monitor  has already been registered assigning a specific monitor serial # to you.  Billing and Patient Assistance Program Information  We have supplied [employer] with any of your insurance information on file for billing purposes. [employer] offers a sliding scale Patient Assistance Program for patients that do not have  insurance, or whose insurance does not completely cover the cost of the ZIO monitor.  You must apply for the Patient Assistance Program to qualify for this discounted rate.  To apply, please call [employer] at [phone],  select option 4, select option 2, ask to apply for  Patient Assistance Program. Theodore Demark will ask your household income, and how many people  are in your household. They will quote your out-of-pocket cost based on that information.  [employer] will also be able to set up a 89-month, interest-free payment plan if needed.  Applying the monitor   Shave hair from upper left chest.  Hold abrader disc by orange tab. Rub abrader in 40 strokes over the upper left chest as  indicated in your monitor instructions.  Clean area with 4 enclosed alcohol pads. Let dry.  Apply patch as indicated in monitor instructions. Patch will be placed under collarbone on left  side of chest with arrow pointing upward.  Rub patch adhesive wings for 2 minutes. Remove white label marked "1". Remove the white  label marked "2". Rub patch adhesive wings for 2 additional minutes.  While looking in a mirror, press and release button in center of patch. A small green light will  flash 3-4 times. This will be your only indicator that the monitor has been turned on.  Do not shower for the first 24 hours. You may shower after the first 24 hours.  Press the button if you feel a symptom. You will hear a small click. Record Date, Time and  Symptom in the Patient Logbook.  When you are ready to remove the patch, follow instructions on the last 2 pages of Patient  Logbook. Stick patch monitor onto the last page of Patient Logbook.  Place Patient Logbook in  the blue and white box. Use locking tab on box and tape box closed  securely. The blue and white box has prepaid postage on it. Please place it in the mailbox as  soon as possible. Your physician should have your test results approximately 7 days after the  monitor has been mailed back to [facility].  Call [facility] Customer Care at [phone] if you have questions regarding  your ZIO XT patch monitor. Call them immediately if you see an orange light blinking on your   monitor.  If your monitor falls off in less than 4 days, contact our Monitor department at [phone].  If your monitor becomes loose or falls off after 4 days call [employer] at [phone] for  suggestions on securing your monitor  You have been referred to our Hypertension Clinic.   Follow-Up: At [facility], you and your health needs are our priority.  As part of our continuing mission to provide you with exceptional heart care, we have created designated Provider Care Teams.  These Care Teams include your primary Cardiologist (physician) and Advanced Practice Providers (APPs -  Physician Assistants and Nurse Practitioners) who all work together to provide you with the care you need, when you need it.  We recommend signing up for the patient portal called "MyChart".  Sign up information is provided on this After Visit Summary.  MyChart is used to connect with patients for Virtual Visits (Telemedicine).  Patients are able to view lab/test results, encounter notes, upcoming appointments, etc.  Non-urgent messages can be sent to your provider as well.   To learn more about what you can do with MyChart, go to [email or web address].    Your next appointment:   1 year(s)  The format for your next appointment:   In Person  Provider:   Dr Donato Schultz      Important Information About Sugar

## 2022-02-14 NOTE — Progress Notes (Signed)
Cardiology Office Note:    Date:  02/14/2022   ID:  Jasmine Esparza, DOB 01/20/1941, MRN [id number]  PCP:  Hoyt Koch, MD   [facility] Providers Cardiologist:  Candee Furbish, MD     Referring MD: Girtha Rm, NP-C    History of Present Illness:    Jasmine Esparza is a 81 y.o. female here for the evaluation of palpitations, dizziness at the request of Mack Hook, NP.  Recently seen in the emergency department on 02/08/2022.  Previously was admitted for hypertensive urgency with reassuring work-up.  She occasionally has intermittent spikes in her blood pressure over the last few months.  Overall can feel bad at times.  Feels her heart race according to prior office note for hours every evening around the same time at 8:30 PM last about 3 hours.  Several ER visits.  For hypertension is currently on lisinopril 10 mg as well as amlodipine 10 mg.  Good this morning then potassium pill feels worse.    June 3rd 2023 started feeling bad  Past Medical History:  Diagnosis Date   ALLERGIC RHINITIS    ECZEMA    GOITER, MULTINODULAR    s/p I-131 ablation 10/2009   Hypercalcemia    HYPERLIPIDEMIA    Hypertension    OSTEOPOROSIS     Past Surgical History:  Procedure Laterality Date   CATARACT EXTRACTION W/ INTRAOCULAR LENS IMPLANT Bilateral 2013    Current Medications: Current Meds  Medication Sig   acetaminophen (TYLENOL) 500 MG tablet Take 500 mg by mouth daily as needed for headache.   ALPRAZolam (XANAX) 0.25 MG tablet Take 1 tablet (0.25 mg total) by mouth 2 (two) times daily as needed for anxiety.   denosumab (PROLIA) 60 MG/ML SOSY injection Inject 1 mL into the skin.   lisinopril (ZESTRIL) 10 MG tablet Take 1 tablet (10 mg total) by mouth daily.   meclizine (ANTIVERT) 12.5 MG tablet Take 1 tablet (12.5 mg total) by mouth 3 (three) times daily as needed for dizziness.   spironolactone (ALDACTONE) 25 MG tablet Take 1 tablet (25 mg total) by mouth daily.    [DISCONTINUED] potassium chloride SA (KLOR-CON M) 20 MEQ tablet Take one pill twice a day for 10 days, then take one pill daily (Patient taking differently: Take 20 mEq by mouth daily.)     Allergies:   Codeine   Social History   Socioeconomic History   Marital status: Married    Spouse name: Not on file   Number of children: 3   Years of education: Not on file   Highest education level: Not on file  Occupational History   Not on file  Tobacco Use   Smoking status: Never    Passive exposure: Never   Smokeless tobacco: Never   Tobacco comments:    Married, lives with spouse. retired  Scientific laboratory technician Use: Never used  Substance and Sexual Activity   Alcohol use: No   Drug use: No   Sexual activity: Not Currently  Other Topics Concern   Not on file  Social History Narrative   Not on file   Social Determinants of Health   Financial Resource Strain: Low Risk  (11/23/2017)   Overall Financial Resource Strain (CARDIA)    Difficulty of Paying Living Expenses: Not hard at all  Food Insecurity: No Food Insecurity (11/23/2017)   Hunger Vital Sign    Worried About Running Out of Food in the Last Year: Never true  Ran Out of Food in the Last Year: Never true  Transportation Needs: No Transportation Needs (11/23/2017)   PRAPARE - Hydrologist (Medical): No    Lack of Transportation (Non-Medical): No  Physical Activity: Sufficiently Active (11/23/2017)   Exercise Vital Sign    Days of Exercise per Week: 5 days    Minutes of Exercise per Session: 40 min  Stress: No Stress Concern Present (11/23/2017)   [place]    Feeling of Stress : Not at all  Social Connections: [place] (11/23/2017)   Social Connection and Isolation Panel [NHANES]    Frequency of Communication with Friends and Family: More than three times a week    Frequency of Social Gatherings with Friends and  Family: More than three times a week    Attends Religious Services: More than 4 times per year    Active Member of [employer] or Organizations: Yes    Attends Music therapist: More than 4 times per year    Marital Status: Married     Family History: The patient's family history includes Arthritis in her father and mother; Hyperlipidemia in her brother; Hypertension in her brother, mother, and sister; Osteoporosis in her sister; Prostate cancer in her father; Stroke in her mother.  ROS:   Please see the history of present illness.    No fevers chills nausea vomiting all other systems reviewed and are negative.  EKGs/Labs/Other Studies Reviewed:    The following studies were reviewed today:  ECHO 12/18/21:   1. Left ventricular ejection fraction, by estimation, is 60 to 65%. The  left ventricle has normal function. The left ventricle has no regional  wall motion abnormalities. Left ventricular diastolic parameters are  consistent with Grade I diastolic  dysfunction (impaired relaxation).   2. Diagnostic criteria for hypertrophic cardiomyopathy not met.   3. Right ventricular systolic function is normal. The right ventricular  size is normal. Tricuspid regurgitation signal is inadequate for assessing  PA pressure.   4. The mitral valve is normal in structure. No evidence of mitral valve  regurgitation. No evidence of mitral stenosis.   5. The aortic valve was not well visualized. Aortic valve regurgitation  is not visualized. No aortic stenosis is present.   6. The inferior vena cava is normal in size with greater than 50%  respiratory variability, suggesting right atrial pressure of 3 mmHg.   Comparison(s): No prior Echocardiogram.    Recent Labs: 11/16/2021: B Natriuretic Peptide 26.5; TSH 2.459 02/10/2022: ALT 14; BUN 22; Creatinine, Ser 0.84; Hemoglobin 13.9; Magnesium 2.3; Platelets 285; Potassium 3.6; Sodium 139  Recent Lipid Panel    Component Value Date/Time    CHOL 220 (H) 08/31/2021 0938   TRIG 102.0 08/31/2021 0938   HDL 74.90 08/31/2021 0938   CHOLHDL 3 08/31/2021 0938   VLDL 20.4 08/31/2021 0938   LDLCALC 124 (H) 08/31/2021 0938   LDLDIRECT 114.9 03/11/2013 1459     Risk Assessment/Calculations:              Physical Exam:    VS:  BP (!) 160/90 (BP Location: Left Arm, Patient Position: Sitting, Cuff Size: Normal)   Pulse 80   Ht _0  (1.549 m)   Wt 122 lb (55.3 kg)   SpO2 96%   BMI 23.05 kg/m     Wt Readings from Last 3 Encounters:  02/14/22 122 lb (55.3 kg)  02/07/22 122 lb (55.3 kg)  01/26/22 122 lb (55.3 kg)     GEN:  Well nourished, well developed in no acute distress HEENT: Normal NECK: No JVD; No carotid bruits LYMPHATICS: No lymphadenopathy CARDIAC: RRR, no murmurs, no rubs, gallops RESPIRATORY:  Clear to auscultation without rales, wheezing or rhonchi  ABDOMEN: Soft, non-tender, non-distended MUSCULOSKELETAL:  No edema; No deformity  SKIN: Warm and dry NEUROLOGIC:  Alert and oriented x 3 PSYCHIATRIC:  Normal affect   ASSESSMENT:    1. Palpitations   2. Primary hypertension   3. Medication management    PLAN:    In order of problems listed above:  Hypertension - I am going to start spironolactone 25 mg once a day.  Stop potassium supplementation.  In 2 weeks recheck basic metabolic profile.  We will have her see the hypertension clinic here. - Next steps could possibly be changing lisinopril 10 mg over to irbesartan 150 mg daily. - We could also consider adding amlodipine 2.5 mg. - Counseled her on ER precautions.  For the most part, if an isolated high blood pressure reading is noted at home, deep breathing, water, antianxiety can be helpful.  Obviously if there are neurogenic qualities to his symptoms she should seek medical attention.  She has been in the ER many times and been reassured forward. -TSH 2.4.  Palpitations - Zio monitor.  Make sure that there are no adverse  arrhythmias.  Hypokalemia - Potassium was 3.0 in July, 3.3 in September.  3.1 back in June.  Prior to June, all potassiums were normal.  Could always consider hyperaldosteronism.  Scattered coronary calcifications - Diet and exercise.  LDL 124.  Could consider low-dose statin therapy in the future.  LDL 124        Medication Adjustments/Labs and Tests Ordered: Current medicines are reviewed at length with the patient today.  Concerns regarding medicines are outlined above.  Orders Placed This Encounter  Procedures   Basic metabolic panel   AMB Referral to [facility] Pharm-D   LONG TERM MONITOR (3-14 DAYS)   Meds ordered this encounter  Medications   spironolactone (ALDACTONE) 25 MG tablet    Sig: Take 1 tablet (25 mg total) by mouth daily.    Dispense:  90 tablet    Refill:  3    Patient Instructions  Medication Instructions:  Please discontinue your Potassium. Start Spironalactone 25 mg daily. Continue all other medications as listed.  *If you need a refill on your cardiac medications before your next appointment, please call your pharmacy*   Lab Work: Please have blood work in 2 weeks (BMP)  If you have labs (blood work) drawn today and your tests are completely normal, you will receive your results only by: [place] (if you have MyChart) OR A paper copy in the mail If you have any lab test that is abnormal or we need to change your treatment, we will call you to review the results.   Testing/Procedures: [place]- Long Term Monitor Instructions  Your physician has requested you wear a ZIO patch monitor for 14 days.  This is a single patch monitor. [employer] supplies one patch monitor per enrollment. Additional stickers are not available. Please do not apply patch if you will be having a Nuclear Stress Test,  Echocardiogram, Cardiac CT, MRI, or Chest Xray during the period you would be wearing the  monitor. The patch cannot be worn during these tests. You  cannot remove and re-apply the  ZIO XT patch monitor.  Your ZIO patch monitor  will be mailed 3 day USPS to your address on file. It may take 3-5 days  to receive your monitor after you have been enrolled.  Once you have received your monitor, please review the enclosed instructions. Your monitor  has already been registered assigning a specific monitor serial # to you.  Billing and Patient Assistance Program Information  We have supplied [employer] with any of your insurance information on file for billing purposes. [employer] offers a sliding scale Patient Assistance Program for patients that do not have  insurance, or whose insurance does not completely cover the cost of the ZIO monitor.  You must apply for the Patient Assistance Program to qualify for this discounted rate.  To apply, please call [employer] at [phone], select option 4, select option 2, ask to apply for  Patient Assistance Program. Theodore Demark will ask your household income, and how many people  are in your household. They will quote your out-of-pocket cost based on that information.  [employer] will also be able to set up a 92-month interest-free payment plan if needed.  Applying the monitor   Shave hair from upper left chest.  Hold abrader disc by orange tab. Rub abrader in 40 strokes over the upper left chest as  indicated in your monitor instructions.  Clean area with 4 enclosed alcohol pads. Let dry.  Apply patch as indicated in monitor instructions. Patch will be placed under collarbone on left  side of chest with arrow pointing upward.  Rub patch adhesive wings for 2 minutes. Remove white label marked "1". Remove the white  label marked "2". Rub patch adhesive wings for 2 additional minutes.  While looking in a mirror, press and release button in center of patch. A small green light will  flash 3-4 times. This will be your only indicator that the monitor has been turned on.  Do not shower for the first 24 hours. You may  shower after the first 24 hours.  Press the button if you feel a symptom. You will hear a small click. Record Date, Time and  Symptom in the Patient Logbook.  When you are ready to remove the patch, follow instructions on the last 2 pages of Patient  Logbook. Stick patch monitor onto the last page of Patient Logbook.  Place Patient Logbook in the blue and white box. Use locking tab on box and tape box closed  securely. The blue and white box has prepaid postage on it. Please place it in the mailbox as  soon as possible. Your physician should have your test results approximately 7 days after the  monitor has been [facility]  Call IDillinghamat [phone] you have questions regarding  your ZIO XT patch monitor. Call them immediately if you see an orange light blinking on your  monitor.  If your monitor falls off in less than 4 days, contact our Monitor department at [phone]  If your monitor becomes loose or falls off after 4 days call [employer] at [phone]  suggestions on securing your monitor  You have been referred to our Hypertension Clinic.   Follow-Up: [facility] you and your health needs are our priority.  As part of our continuing mission to provide you with exceptional heart care, we have created designated Provider Care Teams.  These Care Teams include your primary Cardiologist (physician) and Advanced Practice Providers (APPs -  Physician Assistants and Nurse Practitioners) who all work together to provide you with the  care you need, when you need it.  We recommend signing up for the patient portal called "MyChart".  Sign up information is provided on this After Visit Summary.  MyChart is used to connect with patients for Virtual Visits (Telemedicine).  Patients are able to view lab/test results, encounter notes, upcoming appointments, etc.  Non-urgent messages can be sent to your provider as well.   To learn more  about what you can do with MyChart, go to [email or web address].    Your next appointment:   1 year(s)  The format for your next appointment:   In Person  Provider:   Dr Candee Furbish      Important Information About Sugar         Signed, Candee Furbish, MD  02/14/2022 3:50 PM    [place]

## 2022-02-14 NOTE — Progress Notes (Unsigned)
Enrolled for [employer] to mail a ZIO XT long term holter monitor to the patients address on file.  

## 2022-02-15 ENCOUNTER — Telehealth: Payer: Self-pay | Admitting: Cardiology

## 2022-02-15 NOTE — Telephone Encounter (Signed)
Pt c/o medication issue:  1. Name of Medication: spironolactone (ALDACTONE) 25 MG tablet  2. How are you currently taking this medication (dosage and times per day)? Not yet started  3. Are you having a reaction (difficulty breathing--STAT)? No  4. What is your medication issue? Pt is requesting call back to discuss medication side effects. She states she is going to be in a dance competition on 09/24 and would like to know if she could start taking this medication on 09/25 instead in case there are side effects. Please advise.

## 2022-02-15 NOTE — Telephone Encounter (Signed)
Spoke with pt who was concerned about starting spironolactone as ordered.  She is concerned that she may become dizzy.  Advised pt dizziness could occur with either dehydration/low blood pressure or with elevated blood pressure.  Advised pt since her BP yesterday was elevated, it would be important to start taking the medication as prescribed.  Pt is concerned it may cause her to have a large increase in urine outpt and she has a ballroom dance competition on Sunday.  Instructed to go ahead and start medication as ordered.  If it is causing increased urine output - OK to hold until after her dance on Sunday.  Pt states understanding.  She will call back with any further questions or concerns.

## 2022-02-21 DIAGNOSIS — R002 Palpitations: Secondary | ICD-10-CM

## 2022-02-28 ENCOUNTER — Ambulatory Visit: Payer: Medicare HMO | Attending: Cardiology

## 2022-02-28 DIAGNOSIS — I10 Essential (primary) hypertension: Secondary | ICD-10-CM | POA: Diagnosis not present

## 2022-02-28 DIAGNOSIS — Z79899 Other long term (current) drug therapy: Secondary | ICD-10-CM

## 2022-02-28 LAB — BASIC METABOLIC PANEL WITH GFR
BUN/Creatinine Ratio: 27 (ref 12–28)
BUN: 23 mg/dL (ref 8–27)
CO2: 28 mmol/L (ref 20–29)
Calcium: 10.7 mg/dL — ABNORMAL HIGH (ref 8.7–10.3)
Chloride: 102 mmol/L (ref 96–106)
Creatinine, Ser: 0.84 mg/dL (ref 0.57–1.00)
Glucose: 92 mg/dL (ref 70–99)
Potassium: 4.7 mmol/L (ref 3.5–5.2)
Sodium: 141 mmol/L (ref 134–144)
eGFR: 71 mL/min/{1.73_m2}

## 2022-03-10 DIAGNOSIS — F419 Anxiety disorder, unspecified: Secondary | ICD-10-CM | POA: Diagnosis not present

## 2022-03-10 DIAGNOSIS — I159 Secondary hypertension, unspecified: Secondary | ICD-10-CM | POA: Diagnosis not present

## 2022-03-10 DIAGNOSIS — R69 Illness, unspecified: Secondary | ICD-10-CM | POA: Diagnosis not present

## 2022-03-15 DIAGNOSIS — R002 Palpitations: Secondary | ICD-10-CM | POA: Diagnosis not present

## 2022-03-23 ENCOUNTER — Ambulatory Visit: Payer: Medicare HMO | Attending: Internal Medicine | Admitting: Student

## 2022-03-23 VITALS — BP 138/70 | HR 76

## 2022-03-23 DIAGNOSIS — I10 Essential (primary) hypertension: Secondary | ICD-10-CM | POA: Diagnosis not present

## 2022-03-23 NOTE — Progress Notes (Signed)
Patient ID: Jasmine Esparza                 DOB: 1941-03-09                      MRN: [id number]      HPI: Jasmine Esparza is a 81 y.o. female referred by Dr. Marlou Porch to HTN clinic. PMH is significant for hyperlipidemia osteoporosis. Patient had seen Dr Marlou Porch on 09/18 for palpitation, spirolactone was added to lisinopril 10 mg. BMP on 10/02- normal except slightly elevated calcium.   Today patient does not feel good. She reports having headache little dizziness. It occurs every 3-4 days. Not been able to associate with anything that she takes or do. Reports stop checking BP at home regularly but able to remember 3 readings from last 2 weeks. Denies persistent dizziness, only get light headed when change position from sitting to getting up. Denies Had stopped going to the wellness center for regular exercise and ball dancing with husband as she has fear of falling. Takes medications regularly without forgetting to take them. Patient is here with her husband today we discussed potential medication options to lower the BP ( adding amlodipine lower dose or increasing lisinopril to 20 mg) patient had dizzy spells from amlodipine 10 mg in the past. Patient is concern about tiredness from medication addition or dose increment so want to make small changes at a time.   Current HTN meds: lisinopril 10 mg daily, spironolactone 25 mg daily     Previously tried: Amlodipine 10 mg dizziness  BP goal: <130/80 mmHg   Diet:  Eggs and toast orange juice, green tea salmon, trouts, salads   Exercise:  The patient does not participate in regular exercise at present.  Home BP readings:  Date SBP/DBP  HR  Oct 1 143/81 Not available  Oct 7 153/91   Oct 12 135/78   Average 143/83     Wt Readings from Last 3 Encounters:  02/14/22 122 lb (55.3 kg)  02/07/22 122 lb (55.3 kg)  01/26/22 122 lb (55.3 kg)   BP Readings from Last 3 Encounters:  03/23/22 138/70  02/14/22 (!) 160/90  02/10/22 (!) 150/78   Pulse  Readings from Last 3 Encounters:  03/23/22 76  02/14/22 80  02/10/22 75    Renal function: CrCl cannot be calculated (Patient's most recent lab result is older than the maximum 21 days allowed.).  Past Medical History:  Diagnosis Date   ALLERGIC RHINITIS    ECZEMA    GOITER, MULTINODULAR    s/p I-131 ablation 10/2009   Hypercalcemia    HYPERLIPIDEMIA    Hypertension    OSTEOPOROSIS     Current Outpatient Medications on File Prior to Visit  Medication Sig Dispense Refill   spironolactone (ALDACTONE) 25 MG tablet Take 1 tablet (25 mg total) by mouth daily. 90 tablet 3   [DISCONTINUED] lisinopril (ZESTRIL) 10 MG tablet Take 1 tablet (10 mg total) by mouth daily. 30 tablet 2   acetaminophen (TYLENOL) 500 MG tablet Take 500 mg by mouth daily as needed for headache.     ALPRAZolam (XANAX) 0.25 MG tablet Take 1 tablet (0.25 mg total) by mouth 2 (two) times daily as needed for anxiety. 20 tablet 0   denosumab (PROLIA) 60 MG/ML SOSY injection Inject 1 mL into the skin.     meclizine (ANTIVERT) 12.5 MG tablet Take 1 tablet (12.5 mg total) by mouth 3 (three) times daily as needed for  dizziness. 30 tablet 0   No current facility-administered medications on file prior to visit.    Allergies  Allergen Reactions   Codeine     REACTION: nausea    Blood pressure 138/70, pulse 76, SpO2 95 %.   Assessment/Plan:  1. Hypertension -  Hypertension Assessment: BP: uncontrolled in office reading 1st - 147/70 2nd- 138/70  (goal <130/80) Patient reports tolerating Lisinopril and spironolactone well without any side effects  Patient experiences dizziness when changes positions too quick so had not been doing any exercise  Denies SOB, palpitation or syncope Discussed the BP goal and potential medications options ( addition of CCB, or dose adjustment of the lisinopril) patient is in agreement to make changes to lower the BP  Patient does not want to take amlodipine, had to discontinue in the  past due to dizziness   Plan:  Continue taking spironolactone 25 mg daily  Increase lisinopril from 10 mg daily to 15 mg daily  Encouraged patient to start doing moderate exercise e.g walking 20-30 min every other day or every day  Will repeat BMP in 3-4 weeks and patient to check BP at home and bring log at the next appoitment Follow up scheduled with PharmD in 5 weeks    Thank you  Carmela Hurt, Pharm.D [facility] A Division of [facility] [address], [place], [place] [address]  Phone: [phone]; Fax: [phone]

## 2022-03-23 NOTE — Patient Instructions (Signed)
Changes made by your pharmacist Cammy Copa, PharmD at today's visit:    Instructions/Changes  (what do you need to do) Your Notes  (what you did and when you did it)  Start taking 5 mg lisinopril along with Lisinopril 10 mg daily (total dose 15 mg)    2. Start doing exercise regularly - 20 min walking     Bring all of your meds, your BP cuff and your record of home blood pressures to your next appointment.    HOW TO TAKE YOUR BLOOD PRESSURE AT HOME  Rest 5 minutes before taking your blood pressure.  Don't smoke or drink caffeinated beverages for at least 30 minutes before. Take your blood pressure before (not after) you eat. Sit comfortably with your back supported and both feet on the floor (don't cross your legs). Elevate your arm to heart level on a table or a desk. Use the proper sized cuff. It should fit smoothly and snugly around your bare upper arm. There should be enough room to slip a fingertip under the cuff. The bottom edge of the cuff should be 1 inch above the crease of the elbow. Ideally, take 3 measurements at one sitting and record the average.  Important lifestyle changes to control high blood pressure  Intervention  Effect on the BP  Lose extra pounds and watch your waistline Weight loss is one of the most effective lifestyle changes for controlling blood pressure. If you're overweight or obese, losing even a small amount of weight can help reduce blood pressure. Blood pressure might go down by about 1 millimeter of mercury (mm Hg) with each kilogram (about 2.2 pounds) of weight lost.  Exercise regularly As a general goal, aim for at least 30 minutes of moderate physical activity every day. Regular physical activity can lower high blood pressure by about 5 to 8 mm Hg.  Eat a healthy diet Eating a diet rich in whole grains, fruits, vegetables, and low-fat dairy products and low in saturated fat and cholesterol. A healthy diet can lower high blood pressure by up to  11 mm Hg.  Reduce salt (sodium) in your diet Even a small reduction of sodium in the diet can improve heart health and reduce high blood pressure by about 5 to 6 mm Hg.  Limit alcohol One drink equals 12 ounces of beer, 5 ounces of wine, or 1.5 ounces of 80-proof liquor.  Limiting alcohol to less than one drink a day for women or two drinks a day for men can help lower blood pressure by about 4 mm Hg.   If you have any questions or concerns please use My Chart to send questions or call the office at [phone]

## 2022-03-23 NOTE — Assessment & Plan Note (Addendum)
Assessment:  BP: uncontrolled in office reading 1st - 147/70 2nd- 138/70  (goal <130/80)  Patient reports tolerating Lisinopril and spironolactone well without any side effects   Patient experiences dizziness when changes positions too quick so had not been doing any exercise   Denies SOB, palpitation or syncope  Discussed the BP goal and potential medications options ( addition of CCB, or dose adjustment of the lisinopril) patient is in agreement to make changes to lower the BP   Patient does not want to take amlodipine, had to discontinue in the past due to dizziness   Plan:   Continue taking spironolactone 25 mg daily   Increase lisinopril from 10 mg daily to 15 mg daily   Encouraged patient to start doing moderate exercise e.g walking 20-30 min every other day or every day   Will repeat BMP in 3-4 weeks and patient to check BP at home and bring log at the next appoitment  Follow up scheduled with PharmD in 5 weeks

## 2022-03-24 DIAGNOSIS — I159 Secondary hypertension, unspecified: Secondary | ICD-10-CM | POA: Diagnosis not present

## 2022-03-24 MED ORDER — LISINOPRIL 5 MG PO TABS: 15.0000 mg | ORAL_TABLET | Freq: Every day | ORAL | 3 refills | Status: DC

## 2022-03-28 ENCOUNTER — Ambulatory Visit: Payer: Medicare HMO | Admitting: Internal Medicine

## 2022-04-07 DIAGNOSIS — Z961 Presence of intraocular lens: Secondary | ICD-10-CM | POA: Diagnosis not present

## 2022-04-07 DIAGNOSIS — H21233 Degeneration of iris (pigmentary), bilateral: Secondary | ICD-10-CM | POA: Diagnosis not present

## 2022-04-07 DIAGNOSIS — H26493 Other secondary cataract, bilateral: Secondary | ICD-10-CM | POA: Diagnosis not present

## 2022-04-07 DIAGNOSIS — H31093 Other chorioretinal scars, bilateral: Secondary | ICD-10-CM | POA: Diagnosis not present

## 2022-04-07 DIAGNOSIS — H18513 Endothelial corneal dystrophy, bilateral: Secondary | ICD-10-CM | POA: Diagnosis not present

## 2022-04-13 ENCOUNTER — Ambulatory Visit: Payer: Medicare HMO | Attending: Cardiology

## 2022-04-13 DIAGNOSIS — I10 Essential (primary) hypertension: Secondary | ICD-10-CM | POA: Diagnosis not present

## 2022-04-13 LAB — BASIC METABOLIC PANEL
BUN/Creatinine Ratio: 26 (ref 12–28)
BUN: 25 mg/dL (ref 8–27)
CO2: 26 mmol/L (ref 20–29)
Calcium: 10.1 mg/dL (ref 8.7–10.3)
Chloride: 100 mmol/L (ref 96–106)
Creatinine, Ser: 0.96 mg/dL (ref 0.57–1.00)
Glucose: 97 mg/dL (ref 70–99)
Potassium: 5.1 mmol/L (ref 3.5–5.2)
Sodium: 136 mmol/L (ref 134–144)
eGFR: 60 mL/min/{1.73_m2} (ref >59)

## 2022-04-14 ENCOUNTER — Telehealth: Payer: Self-pay | Admitting: Pharmacist

## 2022-04-14 NOTE — Telephone Encounter (Addendum)
Call to see how lisinopril dose increase lowering the BP and how patient is tolerating the change.   Patient reports taking 15 mg instead of 10 mg makes her dizzy. So stayed on 10 mg dose.  But now her BP has improved as her anxiety is under controlled. Home readings ~ 130-135/80 range and heart rate ~60-65. Have been on Zoloft 25 mg for 1 month and recently dose increased to 50 mg so hoping to get even better control on mood and anxiety.

## 2022-05-05 ENCOUNTER — Ambulatory Visit: Payer: Medicare HMO

## 2022-05-05 ENCOUNTER — Other Ambulatory Visit: Payer: Self-pay | Admitting: Internal Medicine

## 2022-05-11 ENCOUNTER — Ambulatory Visit: Payer: Medicare HMO | Attending: Cardiology | Admitting: Pharmacist

## 2022-05-11 VITALS — BP 110/60 | HR 72

## 2022-05-11 DIAGNOSIS — I10 Essential (primary) hypertension: Secondary | ICD-10-CM | POA: Diagnosis not present

## 2022-05-11 NOTE — Patient Instructions (Signed)
Summary of today's discussion  1.Decrease lisinopril to 1 tablet (5mg ) daily  2. Continue spironolactone 25mg  daily   3. Discuss sertraline with your primary care doctor- if you want to stop I would recommend taking 1/2 tablet for 1 week before stopping  4. I will call you in 2 weeks to see how you are doing  5. Please call me with any questions [phone]   Your blood pressure goal is <140/90  To check your pressure at home you will need to:  1. Sit up in a chair, with feet flat on the floor and back supported. Do not cross your ankles or legs. 2. Rest your left arm so that the cuff is about heart level. If the cuff goes on your upper arm,  then just relax the arm on the table, arm of the chair or your lap. If you have a wrist cuff, we  suggest relaxing your wrist against your chest (think of it as Pledging the Flag with the  wrong arm).  3. Place the cuff snugly around your arm, about 1 inch above the crook of your elbow. The  cords should be inside the groove of your elbow.  4. Sit quietly, with the cuff in place, for about 5 minutes. After that 5 minutes press the power  button to start a reading. 5. Do not talk or move while the reading is taking place.  6. Record your readings on a sheet of paper. Although most cuffs have a memory, it is often  easier to see a pattern developing when the numbers are all in front of you.  7. You can repeat the reading after 1-3 minutes if it is recommended  Make sure your bladder is empty and you have not had caffeine or tobacco within the last 30 min  Always bring your blood pressure log with you to your appointments. If you have not brought your monitor in to be double checked for accuracy, please bring it to your next appointment.  You can find a list of validated (accurate) blood pressure cuffs at   Important lifestyle changes to control high blood pressure  Intervention  Effect on the BP  Lose extra pounds and watch your  waistline Weight loss is one of the most effective lifestyle changes for controlling blood pressure. If you're overweight or obese, losing even a small amount of weight can help reduce blood pressure. Blood pressure might go down by about 1 millimeter of mercury (mm Hg) with each kilogram (about 2.2 pounds) of weight lost.  Exercise regularly As a general goal, aim for at least 30 minutes of moderate physical activity every day. Regular physical activity can lower high blood pressure by about 5 to 8 mm Hg.  Eat a healthy diet Eating a diet rich in whole grains, fruits, vegetables, and low-fat dairy products and low in saturated fat and cholesterol. A healthy diet can lower high blood pressure by up to 11 mm Hg.  Reduce salt (sodium) in your diet Even a small reduction of sodium in the diet can improve heart health and reduce high blood pressure by about 5 to 6 mm Hg.  Limit alcohol One drink equals 12 ounces of beer, 5 ounces of wine, or 1.5 ounces of 80-proof liquor.  Limiting alcohol to less than one drink a day for women or two drinks a day for men can help lower blood pressure by about 4 mm Hg.   Please call me at [phone] with any questions.

## 2022-05-11 NOTE — Assessment & Plan Note (Signed)
Assessment: BP is controlled in office BP 110/ 60 mmHg at the goal (<140/80). Yesterday BP was the same, but she complained of weakness/lethargic. Took only 5mg  of lisinopril last night I think her mental health plays a big role in her blood pressure and symptoms She does not want to take sertraline. I encouraged her to wean off- take 25mg  x 1 week then stop to avoid withdrawal. She needs to discuss her symptoms and jerks with her PCP. Patient wished to come off all BP medications- explained that she wont be able to do that. We will keep medications to a minimal Can consider decreasing dose since she is at higher risk of falls  Plan:  Start taking lisinopril 5mg  daily (decreased from 10mg ) Continue taking spironolactone 25mg  daily Patient to keep record of BP readings with heart rate and report to at the next visit Will call pt in 2 weeks to see how she is doing

## 2022-05-11 NOTE — Progress Notes (Signed)
Patient ID: Jasmine Esparza                 DOB: September 27, 1940                      MRN: [id number]      HPI: Jasmine Esparza is a 81 y.o. female referred by Dr. Marlou Porch to HTN clinic. PMH is significant for hyperlipidemia osteoporosis. Patient had seen Dr Marlou Porch on 09/18 for palpitation, spirolactone was added to lisinopril 10 mg. BMP on 10/02- normal except slightly elevated calcium.   Patient was seen in HTN clinic on 10/25. BP in clinic was 138/70. Home average was 143/83. Lisinopril was increased to 15mg  daily, but when Jasmine Esparza called to follow up 2 weeks later patient reported only taking 10mg  because 15mg  made her dizzy. States her home BP readings had been 130-135/80 range. Her anxiety was better controlled  Patient presents today to clinic accompanied by her husband. She reports she does not want to take sertraline anymore. She did not take it today. Yesterday she was weak, lathargic and her jerking was very bad. I asked if she has discussed her jerking with her primary care and she said she thinks she knows about it. It has been happening for several months. Today she states she feels ok. Husband provides some blood pressures. Yesterday BP was 102-115/75.  Other readings include 143/78, 148/78, 146/75, 148/80, 119/69, 122/73, 102/75, 130/67. Higher readings are from earlier in the month. In clinic today BP was 110/60. Patient denied any dizziness. Last night she took only 5mg  of lisinopril.  Today patient does not feel good. She reports having headache little dizziness. It occurs every 3-4 days. Not been able to associate with anything that she takes or do. Reports stop checking BP at home regularly but able to remember 3 readings from last 2 weeks. Denies persistent dizziness, only get light headed when change position from sitting to getting up. Denies Had stopped going to the wellness center for regular exercise and ball dancing with husband as she has fear of falling. Takes medications regularly  without forgetting to take them. Patient is here with her husband today we discussed potential medication options to lower the BP ( adding amlodipine lower dose or increasing lisinopril to 20 mg) patient had dizzy spells from amlodipine 10 mg in the past. Patient is concern about tiredness from medication addition or dose increment so want to make small changes at a time.    Current HTN meds: lisinopril 10 mg daily, spironolactone 25 mg daily     Previously tried: Amlodipine 10 mg dizziness  BP goal: <130/80 mmHg   Diet:  Eggs and toast orange juice, green tea salmon, trouts, salads   Exercise:  The patient does not participate in regular exercise at present.  Home BP readings:  Date SBP/DBP  HR  12/12 102/75 Not available  12/12 113/70   12/12 115/75   Average      Wt Readings from Last 3 Encounters:  02/14/22 122 lb (55.3 kg)  02/07/22 122 lb (55.3 kg)  01/26/22 122 lb (55.3 kg)   BP Readings from Last 3 Encounters:  05/11/22 110/60  03/23/22 138/70  02/14/22 (!) 160/90   Pulse Readings from Last 3 Encounters:  05/11/22 72  03/23/22 76  02/14/22 80    Renal function: CrCl cannot be calculated (Patient's most recent lab result is older than the maximum 21 days allowed.).  Past Medical History:  Diagnosis Date  ALLERGIC RHINITIS    ECZEMA    GOITER, MULTINODULAR    s/p I-131 ablation 10/2009   Hypercalcemia    HYPERLIPIDEMIA    Hypertension    OSTEOPOROSIS     Current Outpatient Medications on File Prior to Visit  Medication Sig Dispense Refill   acetaminophen (TYLENOL) 500 MG tablet Take 500 mg by mouth daily as needed for headache.     ALPRAZolam (XANAX) 0.25 MG tablet Take 1 tablet (0.25 mg total) by mouth 2 (two) times daily as needed for anxiety. 20 tablet 0   denosumab (PROLIA) 60 MG/ML SOSY injection Inject 1 mL into the skin.     lisinopril (ZESTRIL) 5 MG tablet Take 1 tablet (5 mg total) by mouth daily. 90 tablet 3   meclizine (ANTIVERT) 12.5 MG  tablet Take 1 tablet (12.5 mg total) by mouth 3 (three) times daily as needed for dizziness. 30 tablet 0   spironolactone (ALDACTONE) 25 MG tablet Take 1 tablet (25 mg total) by mouth daily. 90 tablet 3   No current facility-administered medications on file prior to visit.    Allergies  Allergen Reactions   Codeine     REACTION: nausea    Blood pressure 110/60, pulse 72.   Assessment/Plan:  1. Hypertension -  Hypertension Assessment: BP is controlled in office BP 110/ 60 mmHg at the goal (<140/80). Yesterday BP was the same, but she complained of weakness/lethargic. Took only 5mg  of lisinopril last night I think her mental health plays a big role in her blood pressure and symptoms She does not want to take sertraline. I encouraged her to wean off- take 25mg  x 1 week then stop to avoid withdrawal. She needs to discuss her symptoms and jerks with her PCP. Patient wished to come off all BP medications- explained that she wont be able to do that. We will keep medications to a minimal Can consider decreasing dose since she is at higher risk of falls  Plan:  Start taking lisinopril 5mg  daily (decreased from 10mg ) Continue taking spironolactone 25mg  daily Patient to keep record of BP readings with heart rate and report to at the next visit Will call pt in 2 weeks to see how she is doing    Thank you  , Pharm.D, BCPS, CPP [facility] A Division of [facility] [address], [place], [place] Olene Floss  Phone: [phone]; Fax: [phone]

## 2022-05-12 ENCOUNTER — Other Ambulatory Visit (HOSPITAL_COMMUNITY): Payer: Self-pay

## 2022-05-12 NOTE — Telephone Encounter (Signed)
Pharmacy Patient Advocate Encounter  Insurance verification completed.    The patient is insured through [employer]   Ran test claims for: Prolia 60mg.  Pharmacy benefit copay: $100.00  

## 2022-05-12 NOTE — Telephone Encounter (Signed)
Prolia VOB initiated via [email or web address] 

## 2022-05-12 NOTE — Telephone Encounter (Signed)
Forwarding to RX Prior Auth Team 

## 2022-05-17 ENCOUNTER — Other Ambulatory Visit (HOSPITAL_COMMUNITY): Payer: Self-pay

## 2022-05-20 NOTE — Telephone Encounter (Signed)
Pt ready for scheduling on or after 05/20/22   Out-of-pocket cost due at time of visit: $301   Primary: [employer] Prolia co-insurance: 20% (approximately $276) Admin fee co-insurance: 20% (approximately $25)   Secondary: n/a Prolia co-insurance:  Admin fee co-insurance:    Deductible: does not apply   Prior Auth: APPROVED PA# [id number] Valid: 11/22/21-11/22/22   ** This summary of benefits is an estimation of the patient's out-of-pocket cost. Exact cost may vary based on individual plan coverage.

## 2022-05-20 NOTE — Telephone Encounter (Signed)
Pharmacy benefit is cheaper for the patient. Send rx to [facility] if patient prefers

## 2022-06-01 ENCOUNTER — Telehealth: Payer: Self-pay | Admitting: Pharmacist

## 2022-06-01 NOTE — Telephone Encounter (Signed)
Called pt to see how she was doing on the lower dose of lisinopril.  Patient reports blood pressure of 122/72 HR 74 yesterday. She has stopped sertraline, and reports feeling so much better.  She reports a dry cough that she thinks is from her lisinopril.  She would like to stop taking the lisinopril.  Advised that her blood pressure may go above goal without the lisinopril, but he can try stopping the lisinopril and seeing if her dry cough goes away and what happens to her blood pressure.  Potassium currently 5.1, but should improve off of the lisinopril.  If additional blood pressure lowering is needed potentially could increase spironolactone which she reports having new issues with.  Stop lisinopril, continue spironolactone and I will call patient in 2 weeks for follow-up.

## 2022-06-10 DIAGNOSIS — I159 Secondary hypertension, unspecified: Secondary | ICD-10-CM | POA: Diagnosis not present

## 2022-06-10 DIAGNOSIS — R69 Illness, unspecified: Secondary | ICD-10-CM | POA: Diagnosis not present

## 2022-07-06 NOTE — Telephone Encounter (Signed)
Called pt to see how she was doing off of lisinopril. BP was 147/70 at PCP. LVM for pt to call back

## 2022-07-12 NOTE — Addendum Note (Signed)
Addended by: Marcelle Overlie D on: 07/12/2022 03:52 PM   Modules accepted: Orders

## 2022-07-12 NOTE — Telephone Encounter (Signed)
Patient called back. Reports she has not been checking BP. Wants to know how much longer she has to take spironolactone. Advised that she will need to continue to take. If she stops her BP will go back up. She will start checking BP and will call me with results.

## 2022-09-13 ENCOUNTER — Telehealth: Payer: Self-pay

## 2022-09-13 NOTE — Telephone Encounter (Signed)
Contacted Jasmine Esparza to schedule their annual wellness visit. Patient declined to schedule AWV at this time.  Patient states that she has a new PCP at [facility] and no longer a patient of Dr. Okey Dupre.  [name], CMA (AAMA)  [facility]- AWV Program [phone]

## 2022-09-29 DIAGNOSIS — M810 Age-related osteoporosis without current pathological fracture: Secondary | ICD-10-CM | POA: Diagnosis not present

## 2022-09-29 DIAGNOSIS — I159 Secondary hypertension, unspecified: Secondary | ICD-10-CM | POA: Diagnosis not present

## 2022-09-29 DIAGNOSIS — F419 Anxiety disorder, unspecified: Secondary | ICD-10-CM | POA: Diagnosis not present

## 2022-09-29 DIAGNOSIS — R300 Dysuria: Secondary | ICD-10-CM | POA: Diagnosis not present

## 2022-10-19 DIAGNOSIS — R300 Dysuria: Secondary | ICD-10-CM | POA: Diagnosis not present

## 2022-10-19 DIAGNOSIS — I159 Secondary hypertension, unspecified: Secondary | ICD-10-CM | POA: Diagnosis not present

## 2022-10-19 DIAGNOSIS — R319 Hematuria, unspecified: Secondary | ICD-10-CM | POA: Diagnosis not present

## 2022-11-15 DIAGNOSIS — M810 Age-related osteoporosis without current pathological fracture: Secondary | ICD-10-CM | POA: Diagnosis not present

## 2023-02-08 DIAGNOSIS — H31093 Other chorioretinal scars, bilateral: Secondary | ICD-10-CM | POA: Diagnosis not present

## 2023-02-08 DIAGNOSIS — H21233 Degeneration of iris (pigmentary), bilateral: Secondary | ICD-10-CM | POA: Diagnosis not present

## 2023-02-08 DIAGNOSIS — Z961 Presence of intraocular lens: Secondary | ICD-10-CM | POA: Diagnosis not present

## 2023-02-08 DIAGNOSIS — H26493 Other secondary cataract, bilateral: Secondary | ICD-10-CM | POA: Diagnosis not present

## 2023-02-08 DIAGNOSIS — H18513 Endothelial corneal dystrophy, bilateral: Secondary | ICD-10-CM | POA: Diagnosis not present

## 2023-02-17 ENCOUNTER — Other Ambulatory Visit: Payer: Self-pay | Admitting: Cardiology

## 2023-03-23 ENCOUNTER — Ambulatory Visit: Payer: Medicare HMO | Attending: Cardiology | Admitting: Cardiology

## 2023-03-23 ENCOUNTER — Encounter: Payer: Self-pay | Admitting: Cardiology

## 2023-03-23 VITALS — BP 130/84 | HR 71 | Ht 61.0 in | Wt 118.6 lb

## 2023-03-23 DIAGNOSIS — R002 Palpitations: Secondary | ICD-10-CM

## 2023-03-23 DIAGNOSIS — I10 Essential (primary) hypertension: Secondary | ICD-10-CM | POA: Diagnosis not present

## 2023-03-23 NOTE — Patient Instructions (Signed)

## 2023-03-23 NOTE — Progress Notes (Signed)
Cardiology Office Note:  .   Date:  03/23/2023  ID:  Jasmine Esparza, DOB June 20, 1940, MRN [id number] PCP: Frederic Jericho, PA-C  [facility] Providers Cardiologist:  Donato Schultz, MD     History of Present Illness: .   Jasmine Esparza is a 82 y.o. female Discussed withthe use of AI scribe   History of Present Illness    The patient, an 82 year old individual with a history of hypertension, presented for a follow-up visit regarding palpitations. She had been on Lisinopril and Amlodipine for hypertension management in the past but now is on spironolactone 25 mg only.   An echocardiogram performed previously showed an EF of 65% with grade one diastolic dysfunction.   Spironolactone 25mg  was initiated at the last visit, which has been beneficial in controlling blood pressure and managing a previously low potassium level. The patient's LDL was last recorded at 124, creatinine at 0.9, and potassium at 5.1.  The patient reported feeling well overall, with blood pressure readings at home being generally satisfactory, although not regularly monitored. She noted an improvement in the previously reported palpitations. The patient also mentioned occasional feelings of elevated blood pressure, with readings around 140/80 during these instances. However, these episodes were not associated with extremely high readings as experienced in the past. The patient has been maintaining an active lifestyle with regular physical activity and has been mindful of her salt intake. No changes in medication or additional supplements have been introduced.          ROS: No CP, no SOB  Studies Reviewed: Marland Kitchen   EKG Interpretation Date/Time:  Thursday March 23 2023 10:42:16 EDT Ventricular Rate:  71 PR Interval:  130 QRS Duration:  78 QT Interval:  388 QTC Calculation: 421 R Axis:   50  Text Interpretation: Normal sinus rhythm Normal ECG When compared with ECG of 10-Feb-2022 00:50, No significant change  was found Confirmed by Donato Schultz ([id number]) on 03/23/2023 11:02:35 AM    Results LABS LDL: 124 Creatinine: 0.9 Potassium: 5.1  DIAGNOSTIC Echocardiogram: EF 65%, Grade I diastolic dysfunction (12/18/2021) EKG: Normal  ZIO 2023   Normal sinus rhythm avg HR 75 bpm   Rare PAC/PVC   No atrial fibrillation   Brief atrial tachycardia - benign   Reassuring monitor  Risk Assessment/Calculations:            Physical Exam:   VS:  BP 130/84   Pulse 71   Ht 5\' 1"  (1.549 m)   Wt 118 lb 9.6 oz (53.8 kg)   SpO2 99%   BMI 22.41 kg/m    Wt Readings from Last 3 Encounters:  03/23/23 118 lb 9.6 oz (53.8 kg)  02/14/22 122 lb (55.3 kg)  02/07/22 122 lb (55.3 kg)    GEN: Well nourished, well developed in no acute distress NECK: No JVD; No carotid bruits CARDIAC: RRR, no murmurs, no rubs, no gallops RESPIRATORY:  Clear to auscultation without rales, wheezing or rhonchi  ABDOMEN: Soft, non-tender, non-distended EXTREMITIES:  No edema; No deformity   ASSESSMENT AND PLAN: .        Hypertension Improved control on current regimen of  Spironolactone. Blood pressure at today's visit was 130/84. No need for potassium supplementation as current level is 5.1. -Continue current medications. -Encourage occasional home blood pressure monitoring. -Advise to maintain low salt diet and regular physical activity.  Palpitations Improvement noted with current management. -Continue current medications.  Follow-up Stable condition with no changes in management. -Schedule follow-up visit  in 1 year.              Signed, Donato Schultz, MD

## 2023-08-24 DIAGNOSIS — F411 Generalized anxiety disorder: Secondary | ICD-10-CM | POA: Diagnosis not present

## 2023-08-24 DIAGNOSIS — I159 Secondary hypertension, unspecified: Secondary | ICD-10-CM | POA: Diagnosis not present

## 2023-09-19 IMAGING — DX DG ABDOMEN 1V
1 series · 1 of 1 positions shown · non-contrast
Comparison: Radiograph dated May 15, 2018

CLINICAL DATA: unable to pass stool or gas at this point, abdominal
pain. ?obstruction

EXAM:
ABDOMEN - 1 VIEW

[abdomen supine]
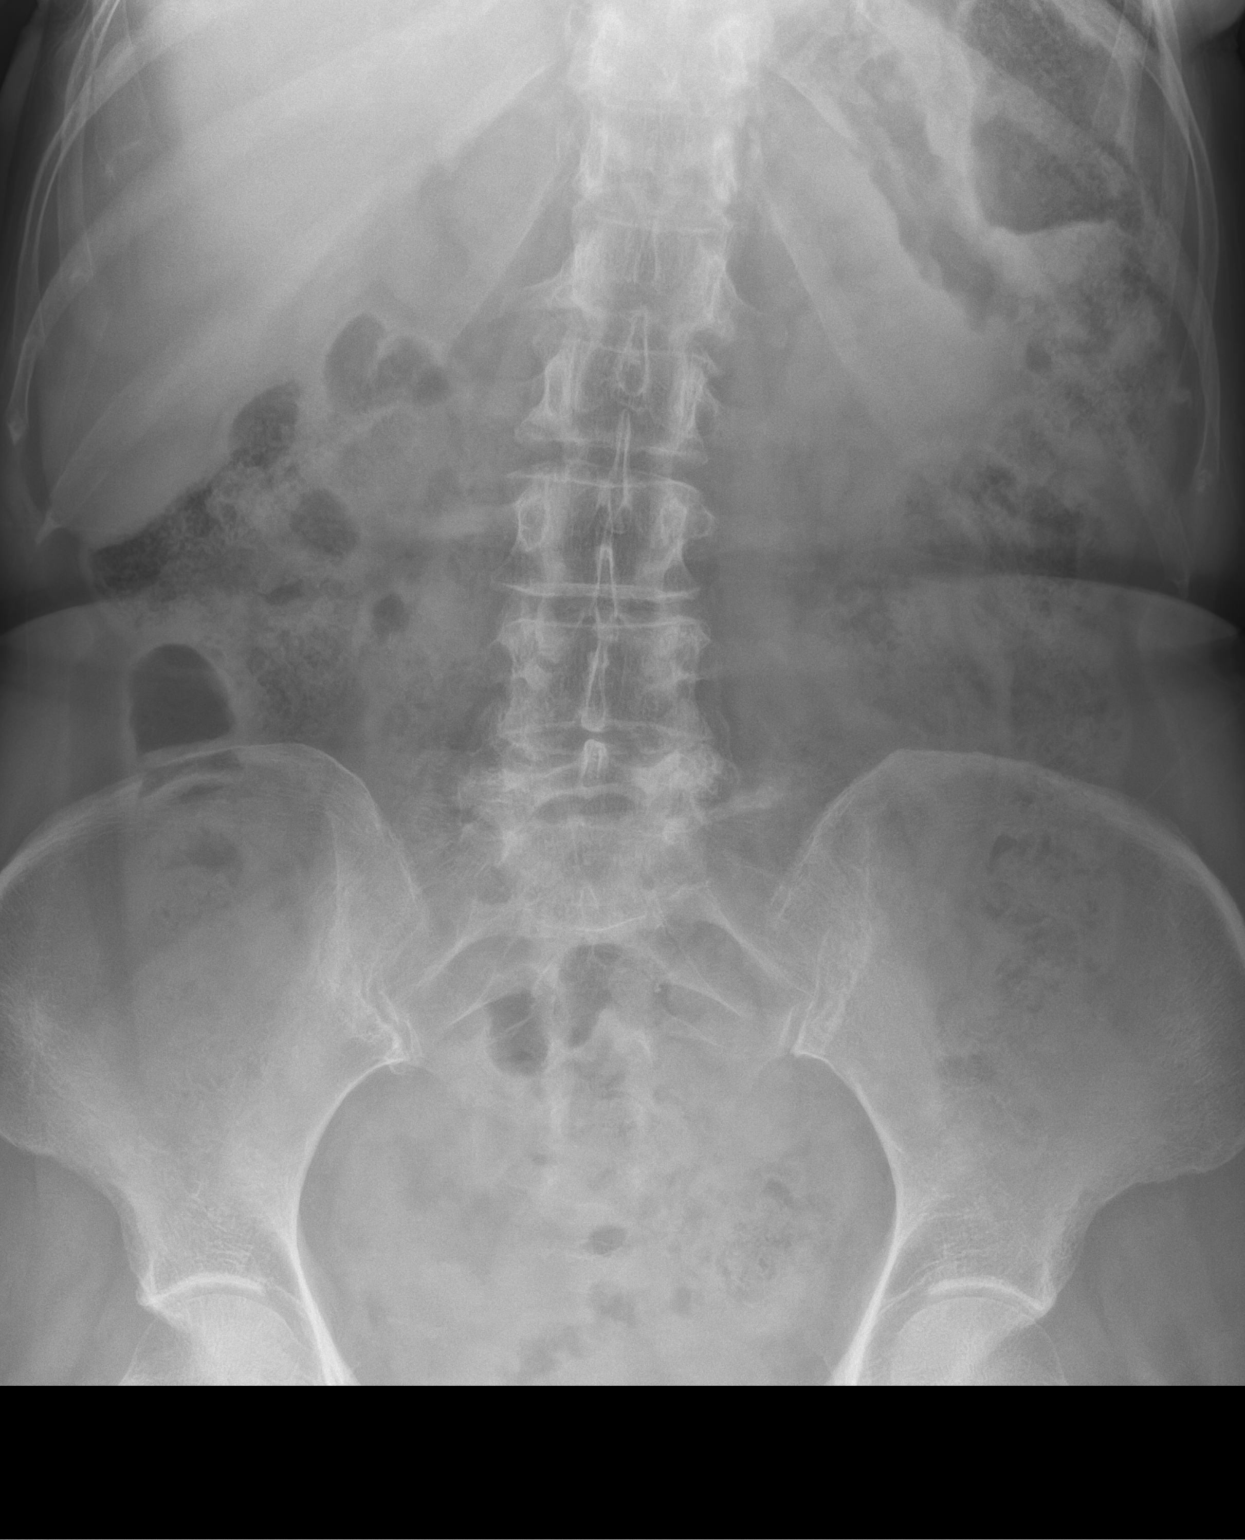

[1 of 1 positions shown; findings below may reference images not displayed]

FINDINGS: Air and stool-filled nondilated loops of bowel. Moderate colonic
stool burden diffusely throughout the colon. Air is visualized in
the rectum. Incomplete visualization of the upper abdomen.
Degenerative changes of the lumbar spine.
IMPRESSION: Nonobstructive bowel gas pattern with moderate colonic stool burden
diffusely throughout the colon. If persistent concern for
obstruction, recommend dedicated CT scan.

## 2023-09-20 IMAGING — CT CT ABD-PELV W/ CM
2 of 5 series · 16 of 46 positions shown, 18 images · IV contrast (agent unspecified)
Comparison: None Available.

CLINICAL DATA: Evaluate for bowel obstruction. Abdominal pain and
constipation for 1 week.

EXAM:
CT ABDOMEN AND PELVIS WITH CONTRAST
TECHNIQUE: Multidetector CT imaging of the abdomen and pelvis was performed
using the standard protocol following bolus administration of
intravenous contrast.

[Series 2: axial st · axial · 0.68mm/px · z∈[-400,-75]mm · 13 of 75 slices shown, 15 images]
[im 5/75  soft-tissue]
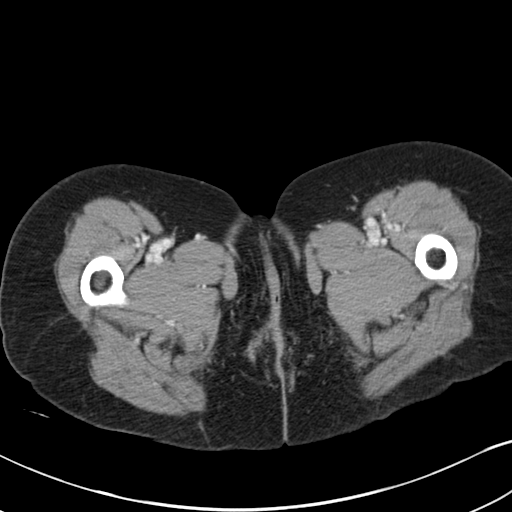
[im 5/75  bone]
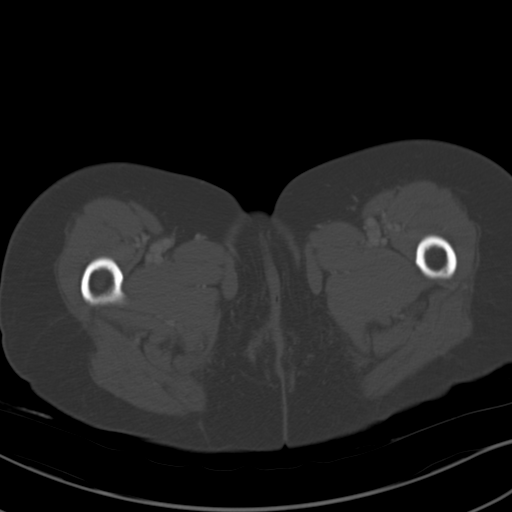
[im 9/75  soft-tissue]
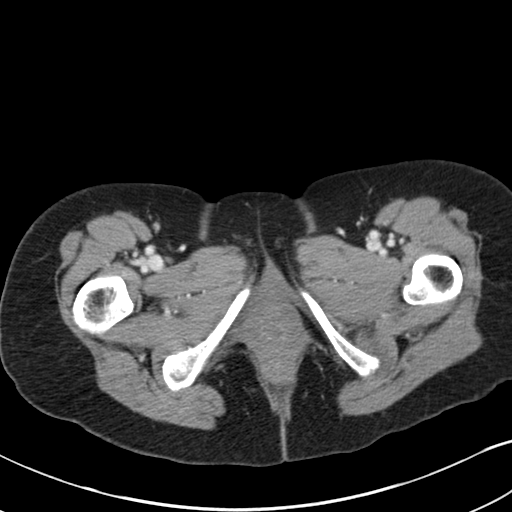
[im 18/75  soft-tissue]
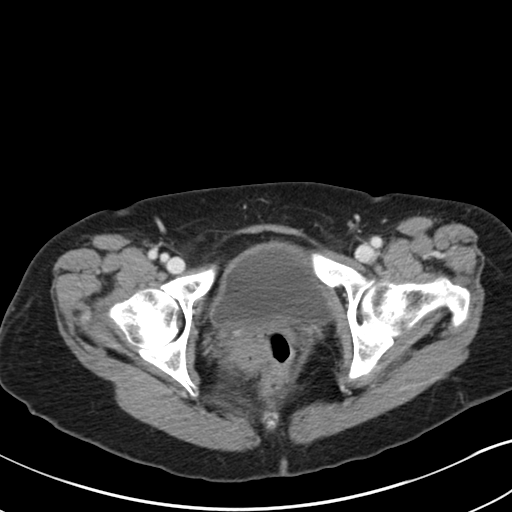
[im 22/75  soft-tissue]
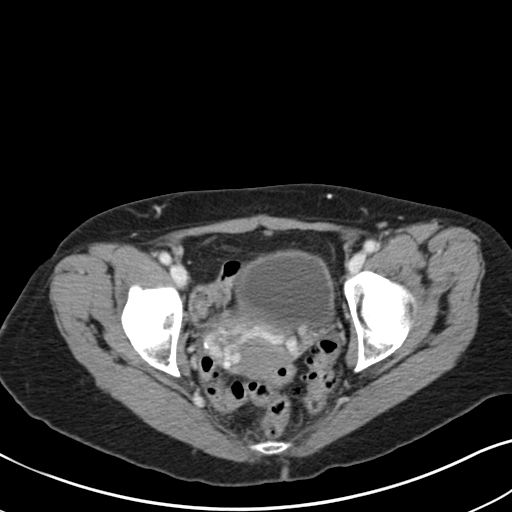
[im 27/75  soft-tissue]
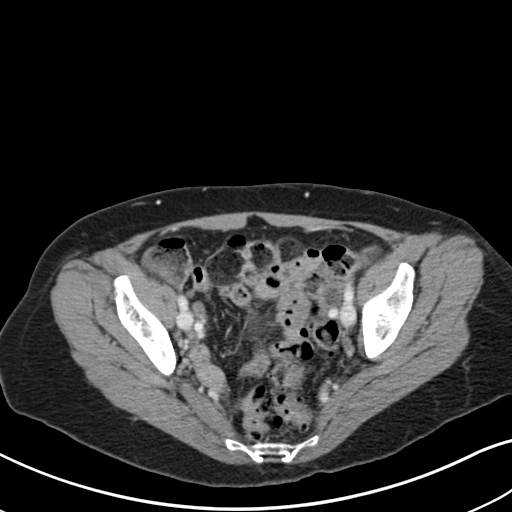
[im 31/75  soft-tissue]
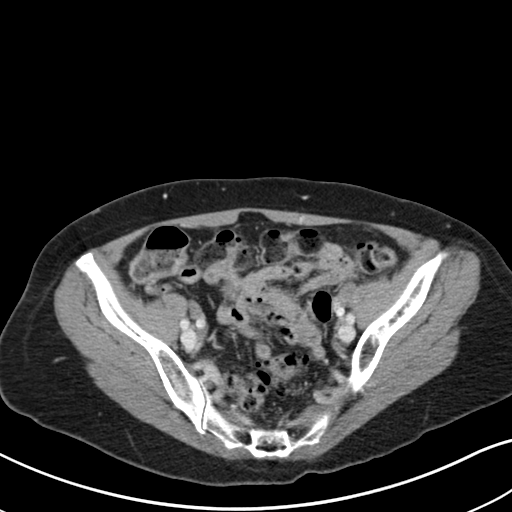
[im 40/75  soft-tissue]
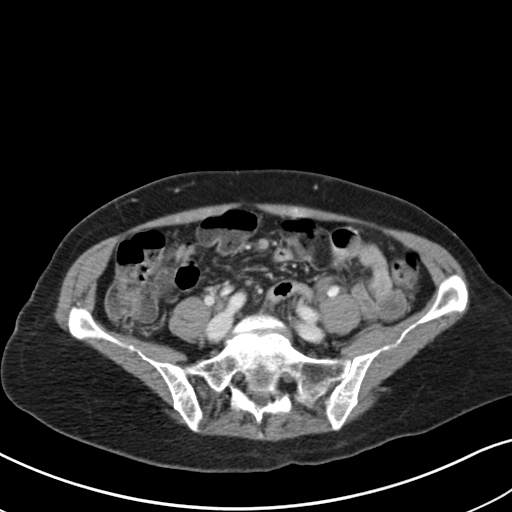
[im 44/75  soft-tissue]
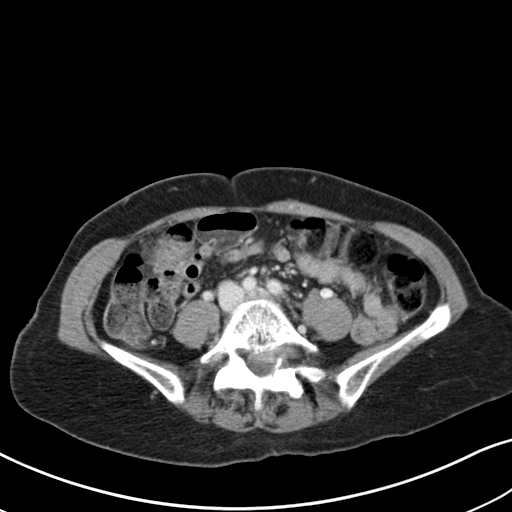
[im 48/75  soft-tissue]
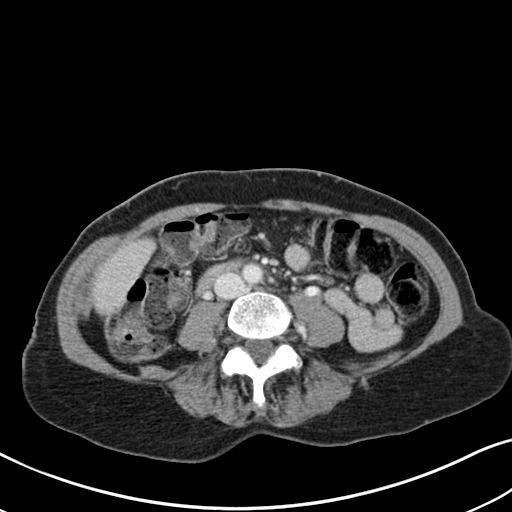
[im 48/75  bone]
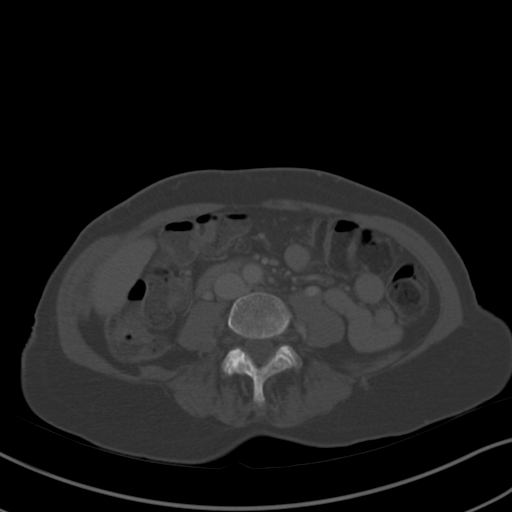
[im 53/75  soft-tissue]
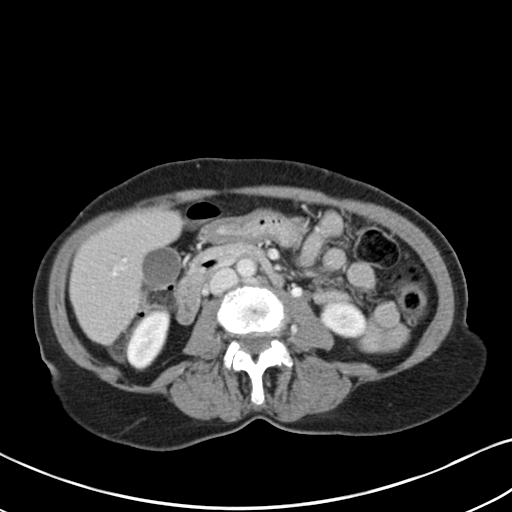
[im 57/75  soft-tissue]
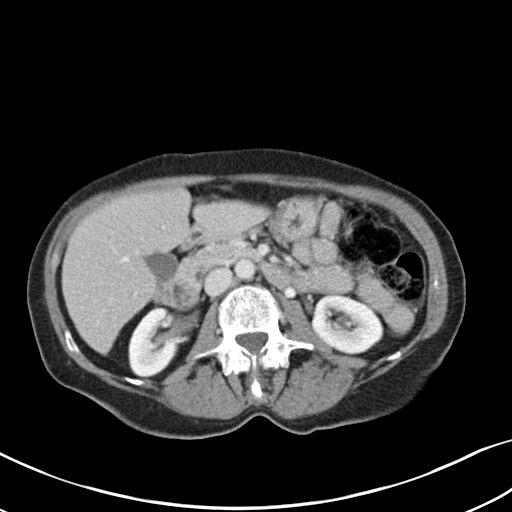
[im 66/75  soft-tissue]
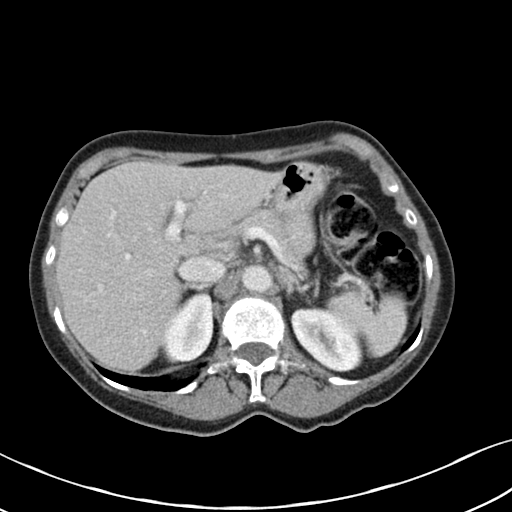
[im 70/75  soft-tissue]
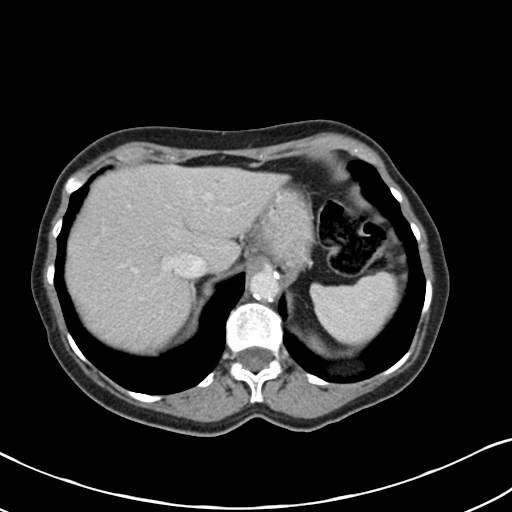

[Series 5: coronal st · coronal · 0.70mm/px · 3 of 128 slices shown]
[im 43/128  soft-tissue]
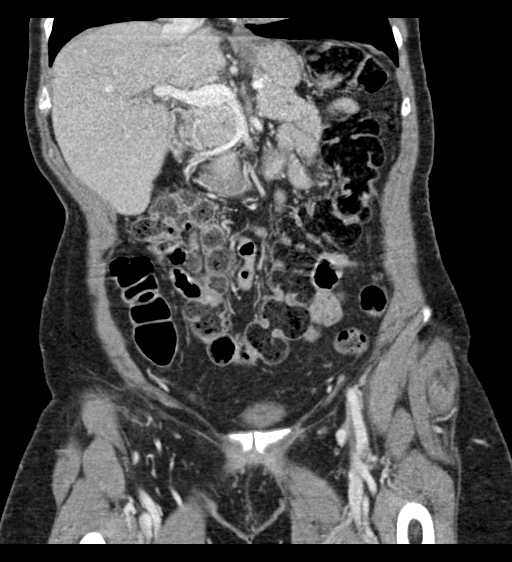
[im 57/128  soft-tissue]
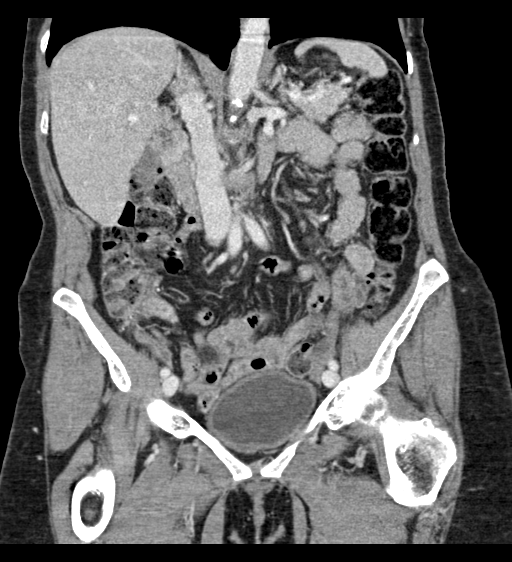
[im 71/128  soft-tissue]
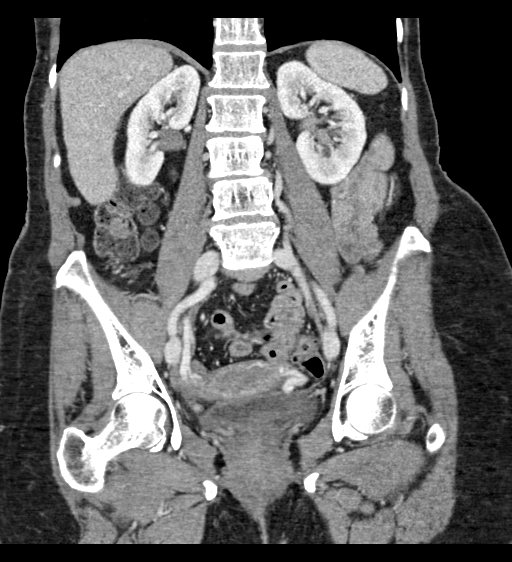

[16 of 46 positions shown; findings below may reference images not displayed]

RADIATION DOSE REDUCTION: This exam was performed according to the
departmental dose-optimization program which includes automated
exposure control, adjustment of the mA and/or kV according to
patient size and/or use of iterative reconstruction technique.

CONTRAST:  100mL OMNIPAQUE IOHEXOL 300 MG/ML  SOLN
FINDINGS: Lower chest: No acute abnormality.

Hepatobiliary: No focal liver abnormality is seen. No gallstones,
gallbladder wall thickening, or biliary dilatation.

Pancreas: Unremarkable. No pancreatic ductal dilatation or
surrounding inflammatory changes.

Spleen: Normal in size without focal abnormality.

Adrenals/Urinary Tract: Normal adrenal glands. No nephrolithiasis,
hydronephrosis, or mass. Mild circumferential wall thickening of the
urinary bladder.

Stomach/Bowel: Stomach appears normal. The appendix is visualized
and appears normal. Mild stool burden identified within the colon.
No bowel wall thickening, inflammation, or distension. Colonic
diverticulosis noted without signs of acute inflammation.

Vascular/Lymphatic: Mild aortic atherosclerosis. No aneurysm. No
signs of abdominopelvic adenopathy.

Reproductive: Increased parametrial veins identified bilaterally
which may reflect underlying pelvic venous congestion. The uterus
and adnexal structures are otherwise unremarkable.

Other: No free fluid or fluid collections. No signs of
pneumoperitoneum.

Musculoskeletal: No acute or significant osseous findings.
IMPRESSION: 1. No acute findings within the abdomen or pelvis. No evidence for
bowel obstruction.
2. Mild circumferential wall thickening of the urinary bladder.
Correlate for any clinical signs or symptoms of cystitis.
3. Colonic diverticulosis without signs of acute inflammation.
4. Increased parametrial veins bilaterally which may reflect
underlying pelvic venous congestion.
5. Mild stool burden identified throughout the colon. Correlate for
any clinical signs or symptoms of constipation.
6. Aortic Atherosclerosis ([id number].C).

## 2024-02-07 ENCOUNTER — Telehealth: Payer: Self-pay | Admitting: Cardiology

## 2024-02-07 MED ORDER — SPIRONOLACTONE 25 MG PO TABS: 25.0000 mg | ORAL_TABLET | Freq: Every day | ORAL | 0 refills | Status: DC

## 2024-02-07 NOTE — Telephone Encounter (Signed)
 RX sent in

## 2024-02-07 NOTE — Telephone Encounter (Signed)
*  STAT* If patient is at the pharmacy, call can be transferred to refill team.   1. Which medications need to be refilled? (please list name of each medication and dose if known)   spironolactone  (ALDACTONE ) 25 MG tablet   2. Would you like to learn more about the convenience, safety, & potential cost savings by using the [facility]?   3. Are you open to using the [facility] (Type [facility]. ).  4. Which pharmacy/location (including street and city if local pharmacy) is medication to be sent to?  [id number] [place], [place] - [address]   5. Do they need a 30 day or 90 day supply?   Patient stated she still has some medication.  Patient has appointment scheduled on  11/20 with Dr. Jeffrie.

## 2024-02-15 DIAGNOSIS — H26493 Other secondary cataract, bilateral: Secondary | ICD-10-CM | POA: Diagnosis not present

## 2024-02-15 DIAGNOSIS — H31093 Other chorioretinal scars, bilateral: Secondary | ICD-10-CM | POA: Diagnosis not present

## 2024-02-15 DIAGNOSIS — Z961 Presence of intraocular lens: Secondary | ICD-10-CM | POA: Diagnosis not present

## 2024-02-15 DIAGNOSIS — H18513 Endothelial corneal dystrophy, bilateral: Secondary | ICD-10-CM | POA: Diagnosis not present

## 2024-02-15 DIAGNOSIS — H16223 Keratoconjunctivitis sicca, not specified as Sjogren's, bilateral: Secondary | ICD-10-CM | POA: Diagnosis not present

## 2024-02-15 DIAGNOSIS — H21233 Degeneration of iris (pigmentary), bilateral: Secondary | ICD-10-CM | POA: Diagnosis not present

## 2024-04-18 ENCOUNTER — Encounter: Payer: Self-pay | Admitting: Cardiology

## 2024-04-18 ENCOUNTER — Ambulatory Visit: Attending: Cardiology | Admitting: Cardiology

## 2024-04-18 VITALS — BP 158/90 | HR 73 | Ht 61.0 in | Wt 120.0 lb

## 2024-04-18 DIAGNOSIS — R002 Palpitations: Secondary | ICD-10-CM

## 2024-04-18 DIAGNOSIS — I10 Essential (primary) hypertension: Secondary | ICD-10-CM | POA: Diagnosis not present

## 2024-04-18 NOTE — Patient Instructions (Signed)
 Medication Instructions:  The current medical regimen is effective;  continue present plan and medications.  *If you need a refill on your cardiac medications before your next appointment, please call your pharmacy*  Follow-Up: At [facility], you and your health needs are our priority.  As part of our continuing mission to provide you with exceptional heart care, our providers are all part of one team.  This team includes your primary Cardiologist (physician) and Advanced Practice Providers or APPs (Physician Assistants and Nurse Practitioners) who all work together to provide you with the care you need, when you need it.  Your next appointment:   1 year(s)  Provider:   One of our Advanced Practice Providers (APPs): Morse Clause, PA-C  Lamarr Satterfield, NP Miriam Shams, NP  Olivia Pavy, PA-C Josefa Beauvais, NP  Leontine Salen, PA-C Orren Fabry, PA-C  [place], PA-C Ernest Dick, NP  Damien Braver, NP Jon Hails, PA-C  Waddell Donath, PA-C    Dayna Dunn, PA-C  Scott Weaver, PA-C Lum Louis, NP Katlyn West, NP Callie Goodrich, PA-C  Xika Zhao, NP Sheng Haley, PA-C    Kathleen Johnson, PA-C       We recommend signing up for the patient portal called MyChart.  Sign up information is provided on this After Visit Summary.  MyChart is used to connect with patients for Virtual Visits (Telemedicine).  Patients are able to view lab/test results, encounter notes, upcoming appointments, etc.  Non-urgent messages can be sent to your provider as well.   To learn more about what you can do with MyChart, go to [email or web address].

## 2024-04-18 NOTE — Progress Notes (Signed)
 Cardiology Office Note:  .   Date:  04/18/2024  ID:  Rock LULLA Kiang, DOB 1940/06/17, MRN [id number] PCP: Latisha Ronal Crank, PA-C  [facility] Providers Cardiologist:  Oneil Parchment, MD    History of Present Illness: .   Jasmine Esparza is a 83 y.o. female Discussed the use of AI scribe  History of Present Illness Jasmine Esparza is an 83 year old female with hypertension and palpitations who presents for follow-up.  She is currently taking spironolactone  25 mg daily for hypertension. Her home blood pressure readings have been good, leading her to stop regular monitoring. Occasionally, she experiences mild shaking associated with higher blood pressure readings, but these episodes are infrequent.  Regarding her palpitations, she experiences 'little skipped beats' but states that everything seems to be fine now. A Zio monitor in 2023 showed brief atrial tachycardia, and no atrial fibrillation was detected. An echocardiogram from 2023 showed an ejection fraction of 65%.  Her recent lab work includes an LDL of 124 mg/dL, creatinine of 9.03 mg/dL, and potassium of 5.1 mmol/L. She is currently taking cold medicine and is concerned it might elevate her blood pressure.        Studies Reviewed: SABRA   EKG Interpretation Date/Time:  Thursday April 18 2024 11:07:48 EST Ventricular Rate:  73 PR Interval:  134 QRS Duration:  84 QT Interval:  392 QTC Calculation: 431 R Axis:   69  Text Interpretation: Normal sinus rhythm Nonspecific ST abnormality When compared with ECG of 23-Mar-2023 10:42, No significant change was found Confirmed by Parchment Oneil ([id number]) on 04/18/2024 11:34:37 AM    Results LABS LDL: 124 Creatinine: 0.96 Potassium: 5.1  DIAGNOSTIC Zio monitor: brief atrial tachycardia, no atrial fibrillation Echocardiogram: EF 65%, grade 1 diastolic dysfunction Risk Assessment/Calculations:           Physical Exam:   VS:  BP (!) 158/90   Pulse 73   Ht 5' 1 (1.549 m)    Wt 120 lb (54.4 kg)   SpO2 95%   BMI 22.67 kg/m    Wt Readings from Last 3 Encounters:  04/18/24 120 lb (54.4 kg)  03/23/23 118 lb 9.6 oz (53.8 kg)  02/14/22 122 lb (55.3 kg)    GEN: Well nourished, well developed in no acute distress NECK: No JVD; No carotid bruits CARDIAC: RRR, no murmurs, no rubs, no gallops RESPIRATORY:  Clear to auscultation without rales, wheezing or rhonchi  ABDOMEN: Soft, non-tender, non-distended EXTREMITIES:  No edema; No deformity   ASSESSMENT AND PLAN: .    Assessment and Plan Assessment & Plan Essential hypertension Blood pressure in the office was 158/90 mmHg. She reports feeling better than last year and is not as nervous. She is currently taking spironolactone  25 mg daily, which she tolerates well without side effects. She occasionally takes cold medicine, which may affect blood pressure. Discussed the use of Coricidin HBP as a cold medicine that should not affect blood pressure. - Continue spironolactone  25 mg daily. - Consider using Coricidin HBP for cold symptoms to avoid affecting blood pressure. - Encouraged periodic home blood pressure monitoring.  Palpitations She reports no recent palpitations or skipped beats. Previous Zio monitor in 2023 showed brief atrial tachycardia but no atrial fibrillation. Echocardiogram in 2023 showed EF 65% with grade 1 diastolic dysfunction. She is currently well-managed with no side effects from spironolactone . - Continue current management with spironolactone .         Dispo: 1 yr  Signed, Oneil Parchment, MD

## 2024-05-18 ENCOUNTER — Other Ambulatory Visit: Payer: Self-pay | Admitting: Cardiology

## 2024-05-22 DIAGNOSIS — I10 Essential (primary) hypertension: Secondary | ICD-10-CM | POA: Diagnosis not present

## 2024-05-22 DIAGNOSIS — M810 Age-related osteoporosis without current pathological fracture: Secondary | ICD-10-CM | POA: Diagnosis not present

## 2024-05-22 DIAGNOSIS — F411 Generalized anxiety disorder: Secondary | ICD-10-CM | POA: Diagnosis not present

## 2024-05-22 DIAGNOSIS — Z131 Encounter for screening for diabetes mellitus: Secondary | ICD-10-CM | POA: Diagnosis not present

## 2024-05-22 DIAGNOSIS — E7800 Pure hypercholesterolemia, unspecified: Secondary | ICD-10-CM | POA: Diagnosis not present
